# Patient Record
Sex: Male | Born: 1960 | Race: White | Hispanic: No | Marital: Married | Smoking: Former smoker
Health system: Southern US, Community
[De-identification: ages and names within clinical notes are randomized; demographics above are authoritative.]

## PROBLEM LIST (undated history)

## (undated) DIAGNOSIS — E119 Type 2 diabetes mellitus without complications: Secondary | ICD-10-CM

## (undated) DIAGNOSIS — I480 Paroxysmal atrial fibrillation: Secondary | ICD-10-CM

## (undated) DIAGNOSIS — I10 Essential (primary) hypertension: Secondary | ICD-10-CM

## (undated) DIAGNOSIS — I2510 Atherosclerotic heart disease of native coronary artery without angina pectoris: Secondary | ICD-10-CM

## (undated) DIAGNOSIS — E079 Disorder of thyroid, unspecified: Secondary | ICD-10-CM

## (undated) DIAGNOSIS — M1990 Unspecified osteoarthritis, unspecified site: Secondary | ICD-10-CM

## (undated) DIAGNOSIS — R55 Syncope and collapse: Secondary | ICD-10-CM

## (undated) DIAGNOSIS — E785 Hyperlipidemia, unspecified: Secondary | ICD-10-CM

## (undated) DIAGNOSIS — R011 Cardiac murmur, unspecified: Secondary | ICD-10-CM

## (undated) DIAGNOSIS — I219 Acute myocardial infarction, unspecified: Secondary | ICD-10-CM

## (undated) DIAGNOSIS — C801 Malignant (primary) neoplasm, unspecified: Secondary | ICD-10-CM

## (undated) DIAGNOSIS — T7840XA Allergy, unspecified, initial encounter: Secondary | ICD-10-CM

## (undated) DIAGNOSIS — D689 Coagulation defect, unspecified: Secondary | ICD-10-CM

## (undated) DIAGNOSIS — I509 Heart failure, unspecified: Secondary | ICD-10-CM

## (undated) HISTORY — PX: CHOLECYSTECTOMY: SHX55

## (undated) HISTORY — DX: Paroxysmal atrial fibrillation: I48.0

## (undated) HISTORY — PX: CARDIAC SURGERY: SHX584

## (undated) HISTORY — DX: Allergy, unspecified, initial encounter: T78.40XA

## (undated) HISTORY — DX: Acute myocardial infarction, unspecified: I21.9

## (undated) HISTORY — PX: EYE SURGERY: SHX253

## (undated) HISTORY — DX: Unspecified osteoarthritis, unspecified site: M19.90

## (undated) HISTORY — DX: Cardiac murmur, unspecified: R01.1

## (undated) HISTORY — DX: Heart failure, unspecified: I50.9

## (undated) HISTORY — DX: Coagulation defect, unspecified: D68.9

---

## 2017-06-27 DIAGNOSIS — R7401 Elevation of levels of liver transaminase levels: Secondary | ICD-10-CM | POA: Insufficient documentation

## 2017-06-27 DIAGNOSIS — I2510 Atherosclerotic heart disease of native coronary artery without angina pectoris: Secondary | ICD-10-CM | POA: Insufficient documentation

## 2017-06-27 DIAGNOSIS — E890 Postprocedural hypothyroidism: Secondary | ICD-10-CM | POA: Insufficient documentation

## 2017-10-21 DIAGNOSIS — R634 Abnormal weight loss: Secondary | ICD-10-CM | POA: Insufficient documentation

## 2017-10-21 DIAGNOSIS — R202 Paresthesia of skin: Secondary | ICD-10-CM | POA: Insufficient documentation

## 2018-09-10 ENCOUNTER — Observation Stay (HOSPITAL_COMMUNITY)
Admission: EM | Admit: 2018-09-10 | Discharge: 2018-09-12 | Disposition: A | Discharge status: Home / Self Care | Payer: Medicaid Other | Attending: Internal Medicine | Admitting: Internal Medicine

## 2018-09-10 ENCOUNTER — Other Ambulatory Visit: Payer: Self-pay

## 2018-09-10 ENCOUNTER — Encounter (HOSPITAL_COMMUNITY): Payer: Self-pay

## 2018-09-10 ENCOUNTER — Emergency Department (HOSPITAL_COMMUNITY): Payer: Medicaid Other

## 2018-09-10 DIAGNOSIS — E079 Disorder of thyroid, unspecified: Secondary | ICD-10-CM | POA: Insufficient documentation

## 2018-09-10 DIAGNOSIS — I200 Unstable angina: Secondary | ICD-10-CM | POA: Diagnosis present

## 2018-09-10 DIAGNOSIS — I2510 Atherosclerotic heart disease of native coronary artery without angina pectoris: Secondary | ICD-10-CM | POA: Diagnosis present

## 2018-09-10 DIAGNOSIS — Z79899 Other long term (current) drug therapy: Secondary | ICD-10-CM | POA: Insufficient documentation

## 2018-09-10 DIAGNOSIS — E119 Type 2 diabetes mellitus without complications: Secondary | ICD-10-CM

## 2018-09-10 DIAGNOSIS — Y831 Surgical operation with implant of artificial internal device as the cause of abnormal reaction of the patient, or of later complication, without mention of misadventure at the time of the procedure: Secondary | ICD-10-CM | POA: Insufficient documentation

## 2018-09-10 DIAGNOSIS — I25110 Atherosclerotic heart disease of native coronary artery with unstable angina pectoris: Secondary | ICD-10-CM | POA: Diagnosis not present

## 2018-09-10 DIAGNOSIS — I5030 Unspecified diastolic (congestive) heart failure: Secondary | ICD-10-CM | POA: Insufficient documentation

## 2018-09-10 DIAGNOSIS — T82858A Stenosis of vascular prosthetic devices, implants and grafts, initial encounter: Principal | ICD-10-CM | POA: Insufficient documentation

## 2018-09-10 DIAGNOSIS — E669 Obesity, unspecified: Secondary | ICD-10-CM | POA: Diagnosis not present

## 2018-09-10 DIAGNOSIS — Z7902 Long term (current) use of antithrombotics/antiplatelets: Secondary | ICD-10-CM | POA: Diagnosis not present

## 2018-09-10 DIAGNOSIS — Z6831 Body mass index (BMI) 31.0-31.9, adult: Secondary | ICD-10-CM | POA: Insufficient documentation

## 2018-09-10 DIAGNOSIS — I110 Hypertensive heart disease with heart failure: Secondary | ICD-10-CM | POA: Diagnosis not present

## 2018-09-10 DIAGNOSIS — E785 Hyperlipidemia, unspecified: Secondary | ICD-10-CM | POA: Diagnosis present

## 2018-09-10 DIAGNOSIS — Z79890 Hormone replacement therapy (postmenopausal): Secondary | ICD-10-CM | POA: Diagnosis not present

## 2018-09-10 DIAGNOSIS — R55 Syncope and collapse: Secondary | ICD-10-CM | POA: Diagnosis not present

## 2018-09-10 DIAGNOSIS — Z955 Presence of coronary angioplasty implant and graft: Secondary | ICD-10-CM | POA: Insufficient documentation

## 2018-09-10 DIAGNOSIS — I10 Essential (primary) hypertension: Secondary | ICD-10-CM | POA: Diagnosis not present

## 2018-09-10 DIAGNOSIS — E876 Hypokalemia: Secondary | ICD-10-CM | POA: Diagnosis not present

## 2018-09-10 DIAGNOSIS — Z87891 Personal history of nicotine dependence: Secondary | ICD-10-CM | POA: Insufficient documentation

## 2018-09-10 HISTORY — DX: Atherosclerotic heart disease of native coronary artery without angina pectoris: I25.10

## 2018-09-10 HISTORY — DX: Disorder of thyroid, unspecified: E07.9

## 2018-09-10 HISTORY — DX: Syncope and collapse: R55

## 2018-09-10 HISTORY — DX: Hyperlipidemia, unspecified: E78.5

## 2018-09-10 HISTORY — DX: Type 2 diabetes mellitus without complications: E11.9

## 2018-09-10 HISTORY — DX: Malignant (primary) neoplasm, unspecified: C80.1

## 2018-09-10 HISTORY — DX: Essential (primary) hypertension: I10

## 2018-09-10 LAB — APTT: aPTT: 27 seconds (ref 24–36)

## 2018-09-10 LAB — MAGNESIUM: Magnesium: 1.9 mg/dL (ref 1.7–2.4)

## 2018-09-10 LAB — CBC
HCT: 45.6 % (ref 39.0–52.0)
Hemoglobin: 15 g/dL (ref 13.0–17.0)
MCH: 29.4 pg (ref 26.0–34.0)
MCHC: 32.9 g/dL (ref 30.0–36.0)
MCV: 89.2 fL (ref 80.0–100.0)
Platelets: 261 10*3/uL (ref 150–400)
RBC: 5.11 MIL/uL (ref 4.22–5.81)
RDW: 12.1 % (ref 11.5–15.5)
WBC: 8 10*3/uL (ref 4.0–10.5)
nRBC: 0 % (ref 0.0–0.2)

## 2018-09-10 LAB — HEPARIN LEVEL (UNFRACTIONATED): Heparin Unfractionated: 0.33 IU/mL (ref 0.30–0.70)

## 2018-09-10 LAB — TSH: TSH: 0.958 u[IU]/mL (ref 0.350–4.500)

## 2018-09-10 LAB — BASIC METABOLIC PANEL
Anion gap: 9 (ref 5–15)
BUN: 11 mg/dL (ref 6–20)
CO2: 23 mmol/L (ref 22–32)
Calcium: 9 mg/dL (ref 8.9–10.3)
Chloride: 105 mmol/L (ref 98–111)
Creatinine, Ser: 0.91 mg/dL (ref 0.61–1.24)
GFR calc Af Amer: >60 mL/min (ref >60)
Glucose, Bld: 226 mg/dL — ABNORMAL HIGH (ref 70–99)
Potassium: 3 mmol/L — ABNORMAL LOW (ref 3.5–5.1)
Sodium: 137 mmol/L (ref 135–145)

## 2018-09-10 LAB — TROPONIN I
Troponin I: <0.03 ng/mL (ref <0.03)
Troponin I: <0.03 ng/mL (ref <0.03)

## 2018-09-10 LAB — HEMOGLOBIN A1C
Hgb A1c MFr Bld: 6.4 % — ABNORMAL HIGH (ref 4.8–5.6)
Mean Plasma Glucose: 136.98 mg/dL

## 2018-09-10 LAB — MRSA PCR SCREENING: MRSA by PCR: NEGATIVE

## 2018-09-10 LAB — PROTIME-INR
INR: 1.01
PROTHROMBIN TIME: 13.2 s (ref 11.4–15.2)

## 2018-09-10 LAB — I-STAT TROPONIN, ED: Troponin i, poc: 0 ng/mL (ref 0.00–0.08)

## 2018-09-10 MED ORDER — AMLODIPINE BESYLATE 10 MG PO TABS
10.0000 mg | ORAL_TABLET | Freq: Every day | ORAL | Status: DC
  Administered 2018-09-11 – 2018-09-12 (×2): 10 mg via ORAL
  Filled 2018-09-10 (×2): qty 1

## 2018-09-10 MED ORDER — CLOPIDOGREL BISULFATE 75 MG PO TABS
75.0000 mg | ORAL_TABLET | Freq: Every day | ORAL | Status: DC
  Administered 2018-09-11 – 2018-09-12 (×2): 75 mg via ORAL
  Filled 2018-09-10 (×2): qty 1

## 2018-09-10 MED ORDER — ASPIRIN EC 81 MG PO TBEC
81.0000 mg | DELAYED_RELEASE_TABLET | Freq: Every day | ORAL | Status: DC
  Administered 2018-09-11 – 2018-09-12 (×2): 81 mg via ORAL
  Filled 2018-09-10 (×2): qty 1

## 2018-09-10 MED ORDER — ASPIRIN 81 MG PO CHEW
324.0000 mg | CHEWABLE_TABLET | Freq: Once | ORAL | Status: DC
  Filled 2018-09-10: qty 4

## 2018-09-10 MED ORDER — LEVOTHYROXINE SODIUM 25 MCG PO TABS
125.0000 ug | ORAL_TABLET | Freq: Every day | ORAL | Status: DC
  Administered 2018-09-11 – 2018-09-12 (×2): 125 ug via ORAL
  Filled 2018-09-10 (×2): qty 1

## 2018-09-10 MED ORDER — DIAZEPAM 2 MG PO TABS: 2.0000 mg | ORAL_TABLET | Freq: Four times a day (QID) | ORAL | Status: DC | PRN

## 2018-09-10 MED ORDER — SODIUM CHLORIDE 0.9 % IV BOLUS
1000.0000 mL | Freq: Once | INTRAVENOUS | Status: AC
  Administered 2018-09-10: 1000 mL via INTRAVENOUS

## 2018-09-10 MED ORDER — NITROGLYCERIN 0.4 MG SL SUBL: 0.4000 mg | SUBLINGUAL_TABLET | SUBLINGUAL | Status: DC | PRN

## 2018-09-10 MED ORDER — MORPHINE SULFATE (PF) 4 MG/ML IV SOLN
4.0000 mg | Freq: Once | INTRAVENOUS | Status: AC
  Administered 2018-09-10: 4 mg via INTRAVENOUS
  Filled 2018-09-10: qty 1

## 2018-09-10 MED ORDER — HEPARIN BOLUS VIA INFUSION
4000.0000 [IU] | Freq: Once | INTRAVENOUS | Status: AC
  Administered 2018-09-10: 4000 [IU] via INTRAVENOUS

## 2018-09-10 MED ORDER — ATORVASTATIN CALCIUM 80 MG PO TABS
80.0000 mg | ORAL_TABLET | Freq: Every day | ORAL | Status: DC
  Administered 2018-09-11 – 2018-09-12 (×2): 80 mg via ORAL
  Filled 2018-09-10 (×2): qty 1

## 2018-09-10 MED ORDER — ACETAMINOPHEN 325 MG PO TABS: 650.0000 mg | ORAL_TABLET | ORAL | Status: DC | PRN

## 2018-09-10 MED ORDER — ONDANSETRON HCL 4 MG/2ML IJ SOLN: 4.0000 mg | Freq: Four times a day (QID) | INTRAMUSCULAR | Status: DC | PRN

## 2018-09-10 MED ORDER — POTASSIUM CHLORIDE CRYS ER 20 MEQ PO TBCR
40.0000 meq | EXTENDED_RELEASE_TABLET | Freq: Once | ORAL | Status: AC
  Administered 2018-09-10: 40 meq via ORAL
  Filled 2018-09-10: qty 2

## 2018-09-10 MED ORDER — ASPIRIN 300 MG RE SUPP
300.0000 mg | RECTAL | Status: AC
  Filled 2018-09-10: qty 1

## 2018-09-10 MED ORDER — HYDROCHLOROTHIAZIDE 12.5 MG PO CAPS
12.5000 mg | ORAL_CAPSULE | Freq: Every day | ORAL | Status: DC
  Administered 2018-09-11: 12.5 mg via ORAL
  Filled 2018-09-10: qty 1

## 2018-09-10 MED ORDER — HEPARIN (PORCINE) 25000 UT/250ML-% IV SOLN
1100.0000 [IU]/h | INTRAVENOUS | Status: DC
  Administered 2018-09-10 – 2018-09-11 (×3): 1100 [IU]/h via INTRAVENOUS
  Filled 2018-09-10 (×2): qty 250

## 2018-09-10 MED ORDER — POTASSIUM CHLORIDE 10 MEQ/100ML IV SOLN
10.0000 meq | INTRAVENOUS | Status: AC
  Administered 2018-09-10 (×2): 10 meq via INTRAVENOUS
  Filled 2018-09-10 (×2): qty 100

## 2018-09-10 MED ORDER — CARVEDILOL 12.5 MG PO TABS
12.5000 mg | ORAL_TABLET | Freq: Two times a day (BID) | ORAL | Status: DC
  Administered 2018-09-11 – 2018-09-12 (×4): 12.5 mg via ORAL
  Filled 2018-09-10 (×5): qty 1

## 2018-09-10 MED ORDER — ASPIRIN 81 MG PO CHEW: 324.0000 mg | CHEWABLE_TABLET | ORAL | Status: AC

## 2018-09-10 NOTE — H&P (Signed)
History and Physical   Patient ID: William Mcmahon, MRN: [id number], DOB: March 18, 1961   Date of Encounter: 09/10/2018, 6:54 PM  Primary Care Provider: System, Pcp Not In Cardiologist: NA Electrophysiologist:  NA  Chief Complaint:  CP  History of Present Illness: William Mcmahon is a 58 y.o. male w/ h/o CAD and multiple PCI, w/ most recent cardiac event being about a year ago (s/p PCI to RCA in Feb 2019 at [facility]), recently relocated to the area and not established w/ local cardiologist. Pt presented to OSH ED c/o CP/jaw pain that started this morning around 11am while he was at church. He describes an "achy" pain in his neck and jaw that started suddenly, 10/10, associated with aching in his teeth, as well as dizziness/lightheadedness and a feeling like he might pass out. It was also associated with diaphoresis and SOB. This he says is essentially his anginal equivalent. The pain lasted 10-20 min or so and improved w/ NTG. He has not had any recent exertional anginal sx until today.   Past Medical History:  Diagnosis Date  . Cancer ([place])    thyroid  . Coronary artery disease    hx of stents  . Hyperlipidemia   . Hypertension   . Thyroid disease     Past Surgical History:  Procedure Laterality Date  . CARDIAC SURGERY     stents  . CHOLECYSTECTOMY       Prior to Admission medications   Medication Sig Start Date End Date Taking? Authorizing Provider  amLODipine (NORVASC) 10 MG tablet Take 10 mg by mouth daily.   Yes [provider]  atorvastatin (LIPITOR) 80 MG tablet Take 80 mg by mouth daily.   Yes [provider]  carvedilol (COREG) 12.5 MG tablet Take 12.5 mg by mouth 2 (two) times daily with a meal.   Yes [provider]  clopidogrel (PLAVIX) 75 MG tablet Take 75 mg by mouth daily.   Yes [provider]  diazepam (VALIUM) 2 MG tablet Take 2 mg by mouth every 6 (six) hours as needed for anxiety.   Yes [provider]    hydrochlorothiazide (MICROZIDE) 12.5 MG capsule Take 12.5 mg by mouth daily.   Yes [provider]  levothyroxine (SYNTHROID, LEVOTHROID) 125 MCG tablet Take 125 mcg by mouth daily before breakfast.   Yes [provider]  nitroGLYCERIN (NITROSTAT) 0.4 MG SL tablet Place 0.4 mg under the tongue every 5 (five) minutes as needed for chest pain. 06/29/17  Yes [provider]  traMADol (ULTRAM) 50 MG tablet Take by mouth every 6 (six) hours as needed.   Yes [provider]  amoxicillin-clavulanate (AUGMENTIN) 875-125 MG tablet Take 1 tablet by mouth 2 (two) times daily.    [provider]  oseltamivir (TAMIFLU) 75 MG capsule Take 75 mg by mouth.    [provider]     Allergies: No Known Allergies  Social History:  The patient  reports that he has quit smoking. He has never used smokeless tobacco. He reports that he does not drink alcohol or use drugs.   Family History:  The patient's family history is not on file.   ROS:  Please see the history of present illness.     All other systems reviewed and negative.   Vital Signs: Blood pressure 125/71, pulse (!) 54, temperature 98.7 F (37.1 C), temperature source Oral, resp. rate 14, height 5\' 9"  (1.753 m), weight 96.3 kg, SpO2 100 %.  PHYSICAL EXAM: General:  Well  nourished, well developed, in no acute distress HEENT: normal Lymph: no adenopathy Neck: no JVD Endocrine:  No thryomegaly Vascular: No carotid bruits; DP pulses 2+ bilaterally  Cardiac:  normal S1, S2; RRR; no murmur  Lungs:  clear to auscultation bilaterally, no wheezing, rhonchi or rales  Abd: soft, nontender, no hepatomegaly  Ext: no edema Musculoskeletal:  No deformities, BUE and BLE strength normal and equal Skin: warm and dry  Neuro:  CNs 2-12 intact, no focal abnormalities noted Psych:  Normal affect   EKG:  SB, HR 48, LPFB, nonspecific ST changes  Labs:   Lab Results  Component Value Date   WBC 8.0 09/10/2018    HGB 15.0 09/10/2018   HCT 45.6 09/10/2018   MCV 89.2 09/10/2018   PLT 261 09/10/2018   Recent Labs  Lab 09/10/18 1250  NA 137  K 3.0*  CL 105  CO2 23  BUN 11  CREATININE 0.91  CALCIUM 9.0  GLUCOSE 226*   Recent Labs    09/10/18 1418  TROPONINI <0.03   Troponin (Point of Care Test) Recent Labs    09/10/18 1248  TROPIPOC 0.00    No results found for: CHOL, HDL, LDLCALC, TRIG No results found for: DDIMER  Radiology/Studies:  Dg Chest 2 View  Result Date: 09/10/2018 CLINICAL DATA:  Chest pain radiating to jaw. Coronary artery disease EXAM: CHEST - 2 VIEW COMPARISON:  None. FINDINGS: The heart size and mediastinal contours are within normal limits. Low lung volumes are noted. Both lungs are clear. The visualized skeletal structures are unremarkable. IMPRESSION: No active cardiopulmonary disease. Electronically Signed   By: Earle Gell M.D.   On: 09/10/2018 13:49   LHC 10-24-17 in [place] via [facility] Severe mid right coronary artery disease.  Moderate disease within the obtuse marginal and posterior descending  artery.  Patent stents within the LAD, ramus intermedius, and right coronary  artery.  RECOMMENDATIONS:  This gentleman's chest pain syndrome appears to be related to the severe  mid right coronary disease. A subsequent pressure wire showed the LAD  disease to be not significant. Dr. Moshe Cipro proceeded with drug-eluting  stent placement to the right coronary artery with good results. Continued  aggressive medical therapy is planned.  TTE 10-23-17 at [place] left ventricular wall motion and contractility are within normal Limits. EF 50-55%. There is a trace of mitral regurgitation. There is mild tricuspid regurgitation. There is no pericardial effusion.    ASSESSMENT AND PLAN:   1. CAD: possible [place] w/ new/worsening CP sx. Will cont heparin IV gtt. Pt currently pain-free. Enzymes thus far has been negative, no acute ST changes. Will  plan for elective LHC in the AM, routine TTE  2. HTN/dyslipidemia: will restart home regimen. Pt on high-intensity statin therapy; if FLP shows LDL currently >70 he could be considered for PCSK9i therapy as outpatient.   Signed,  Rudean Curt, MD  09/10/2018 6:54 PM

## 2018-09-10 NOTE — ED Provider Notes (Signed)
[facility] EMERGENCY DEPARTMENT Provider Note   CSN: [id number] Arrival date & time: 09/10/18  1232     History   Chief Complaint Chief Complaint  Patient presents with  . Chest Pain  . Jaw Pain    HPI William Mcmahon is a 58 y.o. male.  He has a significant history of coronary artery disease with multiple stents.  His last cardiac event was about a year ago.  He said he was in church today at around 11:10 AM when he started getting some neck and jaw pain.  Says his teeth were aching and he rated the pain as 10 out of 10.  He felt like he might pass out.  Ultimately he was able to get out of the car with assistance with his family and he was given 2 nitroglycerin by his wife.  It was associated with diaphoresis and feeling short of breath.  He is here complaining of 2 out of 10 jaw pain.  There was never any chest pain.  He said about a week ago he had a syncopal event at the dinner table.  He fell and hit his head.  Sounds like most of his cardiology work-up was in the area [place].  He is recently moved to this area.  The history is provided by the patient.  Chest Pain  Pain quality: aching   Pain radiates to:  Neck, L jaw and R jaw Pain severity:  Severe Onset quality:  Sudden Duration:  2 hours Timing:  Constant Progression:  Partially resolved Chronicity:  Recurrent Context: at rest   Context comment:  At church Relieved by:  Nitroglycerin Worsened by:  Nothing Ineffective treatments:  None tried Associated symptoms: diaphoresis, fatigue, nausea, near-syncope and shortness of breath   Associated symptoms: no abdominal pain, no altered mental status, no back pain, no cough, no fever, no headache, no heartburn, no lower extremity edema, no numbness and no palpitations   Risk factors: coronary artery disease, high cholesterol and hypertension     Past Medical History:  Diagnosis Date  . Cancer ([place])    thyroid  . Coronary artery disease    hx of stents  .  Hyperlipidemia   . Hypertension   . Thyroid disease     There are no active problems to display for this patient.   Past Surgical History:  Procedure Laterality Date  . CARDIAC SURGERY     stents  . CHOLECYSTECTOMY          Home Medications    Prior to Admission medications   Not on File    Family History No family history on file.  Social History Social History   Tobacco Use  . Smoking status: Former Smoker  Substance Use Topics  . Alcohol use: Never    Frequency: Never  . Drug use: Never     Allergies   Patient has no known allergies.   Review of Systems Review of Systems  Constitutional: Positive for diaphoresis and fatigue. Negative for fever.  HENT: Negative for sore throat.   Eyes: Negative for visual disturbance.  Respiratory: Positive for shortness of breath. Negative for cough.   Cardiovascular: Positive for chest pain and near-syncope. Negative for palpitations.  Gastrointestinal: Positive for nausea. Negative for abdominal pain and heartburn.  Genitourinary: Negative for dysuria.  Musculoskeletal: Positive for neck pain. Negative for back pain.  Skin: Positive for wound (forehead abrasion). Negative for rash.  Neurological: Negative for numbness and headaches.     Physical Exam  Updated Vital Signs BP 111/69 (BP Location: Left Arm)   Pulse (!) 53   Temp 97.6 F (36.4 C) (Oral)   Resp 15   Ht 5\' 9"  (1.753 m)   Wt 102.1 kg   SpO2 98%   BMI 33.23 kg/m   Physical Exam Vitals signs and nursing note reviewed.  Constitutional:      Appearance: He is well-developed.  HENT:     Head: Normocephalic.     Comments: He has abrasions on his forehead. Eyes:     Conjunctiva/sclera: Conjunctivae normal.  Neck:     Musculoskeletal: Neck supple.  Cardiovascular:     Rate and Rhythm: Normal rate and regular rhythm.     Heart sounds: Normal heart sounds. No murmur.  Pulmonary:     Effort: Pulmonary effort is normal. No respiratory distress.       Breath sounds: Normal breath sounds.  Abdominal:     Palpations: Abdomen is soft.     Tenderness: There is no abdominal tenderness.  Musculoskeletal: Normal range of motion.        General: No tenderness or deformity.     Right lower leg: He exhibits no tenderness. No edema.     Left lower leg: He exhibits no tenderness. No edema.  Skin:    General: Skin is warm and dry.     Capillary Refill: Capillary refill takes less than 2 seconds.  Neurological:     General: No focal deficit present.     Mental Status: He is alert and oriented to person, place, and time.      ED Treatments / Results  Labs (all labs ordered are listed, but only abnormal results are displayed) Labs Reviewed  BASIC METABOLIC PANEL - Abnormal; Notable for the following components:      Result Value   Potassium 3.0 (*)    Glucose, Bld 226 (*)    All other components within normal limits  CBC  MAGNESIUM  TROPONIN I  PROTIME-INR  APTT  HEPARIN LEVEL (UNFRACTIONATED)  I-STAT TROPONIN, ED  I-STAT TROPONIN, ED    EKG EKG Interpretation  Date/Time:  Sunday September 10 2018 12:43:03 EST Ventricular Rate:  55 PR Interval:    QRS Duration: 107 QT Interval:  410 QTC Calculation: 393 R Axis:   94 Text Interpretation:  Sinus rhythm Left posterior fascicular block Abnormal R-wave progression, late transition Nonspecific T abnormalities, anterior leads Baseline wander in lead(s) II III aVF no prior to compare with Confirmed by Aletta Edouard [phone]) on 09/10/2018 12:46:38 PM   Radiology Dg Chest 2 View  Result Date: 09/10/2018 CLINICAL DATA:  Chest pain radiating to jaw. Coronary artery disease EXAM: CHEST - 2 VIEW COMPARISON:  None. FINDINGS: The heart size and mediastinal contours are within normal limits. Low lung volumes are noted. Both lungs are clear. The visualized skeletal structures are unremarkable. IMPRESSION: No active cardiopulmonary disease. Electronically Signed   By: Earle Gell M.D.   On:  09/10/2018 13:49    Procedures .Critical Care Performed by: Hayden Rasmussen, MD Authorized by: Hayden Rasmussen, MD   Critical care provider statement:    Critical care time (minutes):  45   Critical care time was exclusive of:  Separately billable procedures and treating other patients   Critical care was necessary to treat or prevent imminent or life-threatening deterioration of the following conditions:  Cardiac failure   Critical care was time spent personally by me on the following activities:  Discussions with consultants, evaluation of  patient's response to treatment, examination of patient, ordering and performing treatments and interventions, ordering and review of laboratory studies, ordering and review of radiographic studies, pulse oximetry, re-evaluation of patient's condition, obtaining history from patient or surrogate, review of old charts and development of treatment plan with patient or surrogate   I assumed direction of critical care for this patient from another provider in my specialty: no     (including critical care time)  Medications Ordered in ED Medications  aspirin chewable tablet 324 mg (324 mg Oral Not Given 09/10/18 1320)  sodium chloride 0.9 % bolus 1,000 mL (1,000 mLs Intravenous New Bag/Given 09/10/18 1444)  potassium chloride 10 mEq in 100 mL IVPB (10 mEq Intravenous New Bag/Given 09/10/18 1411)  heparin bolus via infusion 4,000 Units (has no administration in time range)    Followed by  heparin ADULT infusion 100 units/mL (25000 units/227mL sodium chloride 0.45%) (has no administration in time range)  morphine 4 MG/ML injection 4 mg (4 mg Intravenous Given 09/10/18 1320)  sodium chloride 0.9 % bolus 1,000 mL (1,000 mLs Intravenous New Bag/Given 09/10/18 1329)  potassium chloride SA (K-DUR,KLOR-CON) CR tablet 40 mEq (40 mEq Oral Given 09/10/18 1408)  morphine 4 MG/ML injection 4 mg (4 mg Intravenous Given 09/10/18 1405)     Initial Impression / Assessment  and Plan / ED Course  I have reviewed the triage vital signs and the nursing notes.  Pertinent labs & imaging results that were available during my care of the patient were reviewed by me and considered in my medical decision making (see chart for details).  Clinical Course as of Sep 11 1451  Sun Sep 10, 2018  1353 Patient had been pain-free while he was here.  I just got called that his jaw pain recurred and was severe radiating to his back.  Repeat EKG does not show any ST elevations.  He says the pain is letting up now.  I put him in for a CT chest and have ordered him some more pain medicine.  We will repeat a troponin now.   [MB]  1638 Discussed with Dr. Haroldine Laws at [facility] cardiology.  He is accepting the patient to stepdown.  [facility] involved.  He recommends starting the patient on heparin drip.  He did not feel the patient needed a CT.    [MB]    Clinical Course User Index [MB] Hayden Rasmussen, MD     Final Clinical Impressions(s) / ED Diagnoses   Final diagnoses:  Unstable angina [facility])  Hypokalemia    ED Discharge Orders    None       Hayden Rasmussen, MD 09/10/18 1454

## 2018-09-10 NOTE — ED Triage Notes (Addendum)
Family reports that pt was at church and approx 1110 started complaining of neck and jaw pain. Reports that is how he presents with CP Wife states she gave 2 nitroglycerin. Pt reports that is feels like it is throbbing in his neck. Complaining of difficulty breathing. Pt had syncopal episode last week

## 2018-09-10 NOTE — Progress Notes (Signed)
ANTICOAGULATION CONSULT NOTE - Initial Consult  Pharmacy Consult for Heparin Indication: chest pain/ACS  No Known Allergies  Patient Measurements: Height: 5\' 9"  (175.3 cm) Weight: 225 lb (102.1 kg) IBW/kg (Calculated) : 70.7 HEPARIN DW (KG): 92.5  Vital Signs: Temp: 97.6 F (36.4 C) (01/12 1248) Temp Source: Oral (01/12 1248) BP: 102/65 (01/12 1330) Pulse Rate: 50 (01/12 1330)  Labs: Recent Labs    09/10/18 1250  HGB 15.0  HCT 45.6  PLT 261  CREATININE 0.91    Estimated Creatinine Clearance: 106.8 mL/min (by C-G formula based on SCr of 0.91 mg/dL).   Medical History: Past Medical History:  Diagnosis Date  . Cancer ([place])    thyroid  . Coronary artery disease    hx of stents  . Hyperlipidemia   . Hypertension   . Thyroid disease     Medications:  See med rec  Assessment: Patient presented to ED with difficulty in breathing and CP earlier this morning at church. Patient also noted he had neck and jaw pain. Pharmacy asked to start heparin  Goal of Therapy:  Heparin level 0.3-0.7 units/ml Monitor platelets by anticoagulation protocol: Yes   Plan:  Give 4000 units bolus x 1 Start heparin infusion at 1100 units/hr Check anti-Xa level in 6-8 hours and daily while on heparin Continue to monitor H&H and platelets   Isac Sarna, BS Vena [place], BCPS Clinical Pharmacist Pager [phone] Cristy Friedlander 09/10/2018,2:44 PM

## 2018-09-10 NOTE — Progress Notes (Signed)
ANTICOAGULATION CONSULT NOTE - Follow-up Consult  Pharmacy Consult for Heparin Indication: chest pain/ACS  No Known Allergies  Patient Measurements: Height: 5\' 9"  (175.3 cm) Weight: 212 lb 4.9 oz (96.3 kg) IBW/kg (Calculated) : 70.7 HEPARIN DW (KG): 90.8  Vital Signs: Temp: 98.2 F (36.8 C) (01/12 1929) Temp Source: Oral (01/12 1929) BP: 122/83 (01/12 1929) Pulse Rate: 58 (01/12 1929)  Labs: Recent Labs    09/10/18 1245 09/10/18 1250 09/10/18 1418 09/10/18 1953 09/10/18 2141  HGB  --  15.0  --   --   --   HCT  --  45.6  --   --   --   PLT  --  261  --   --   --   APTT 27  --   --   --   --   LABPROT 13.2  --   --   --   --   INR 1.01  --   --   --   --   HEPARINUNFRC  --   --   --   --  0.33  CREATININE  --  0.91  --   --   --   TROPONINI  --   --  <0.03 <0.03  --     Estimated Creatinine Clearance: 103.7 mL/min (by C-G formula based on SCr of 0.91 mg/dL).   Assessment: Patient presented to ED with difficulty in breathing and CP earlier this morning at church. Patient also noted he had neck and jaw pain. Pharmacy asked to start heparin.  Heparin level therapeutic (0.33) on gtt at 1100 units/hr. Noted plan for cath in the morning.  Goal of Therapy:  Heparin level 0.3-0.7 units/ml Monitor platelets by anticoagulation protocol: Yes   Plan:  Continue heparin at 1100 units/hr Will f/u daily heparin level  Sherlon Handing, PharmD, BCPS Clinical pharmacist  **Pharmacist phone directory can now be found on [email or web address] ([email or web address]).  Listed under [place]. 09/10/2018,10:24 PM

## 2018-09-11 ENCOUNTER — Observation Stay (HOSPITAL_BASED_OUTPATIENT_CLINIC_OR_DEPARTMENT_OTHER): Payer: Medicaid Other

## 2018-09-11 ENCOUNTER — Encounter (HOSPITAL_COMMUNITY)
Admission: EM | Disposition: A | Discharge status: Home / Self Care | Payer: Self-pay | Source: Home / Self Care | Attending: Emergency Medicine

## 2018-09-11 ENCOUNTER — Encounter (HOSPITAL_COMMUNITY): Payer: Self-pay | Admitting: *Deleted

## 2018-09-11 DIAGNOSIS — R7303 Prediabetes: Secondary | ICD-10-CM | POA: Diagnosis not present

## 2018-09-11 DIAGNOSIS — E785 Hyperlipidemia, unspecified: Secondary | ICD-10-CM | POA: Diagnosis not present

## 2018-09-11 DIAGNOSIS — T82858A Stenosis of vascular prosthetic devices, implants and grafts, initial encounter: Secondary | ICD-10-CM | POA: Diagnosis not present

## 2018-09-11 DIAGNOSIS — E876 Hypokalemia: Secondary | ICD-10-CM | POA: Diagnosis not present

## 2018-09-11 DIAGNOSIS — Z955 Presence of coronary angioplasty implant and graft: Secondary | ICD-10-CM | POA: Diagnosis not present

## 2018-09-11 DIAGNOSIS — I200 Unstable angina: Secondary | ICD-10-CM

## 2018-09-11 DIAGNOSIS — I25110 Atherosclerotic heart disease of native coronary artery with unstable angina pectoris: Secondary | ICD-10-CM | POA: Diagnosis not present

## 2018-09-11 HISTORY — PX: LEFT HEART CATH AND CORONARY ANGIOGRAPHY: CATH118249

## 2018-09-11 LAB — LIPID PANEL
CHOLESTEROL: 113 mg/dL (ref 0–200)
HDL: 25 mg/dL — ABNORMAL LOW (ref >40)
LDL Cholesterol: 73 mg/dL (ref 0–99)
Total CHOL/HDL Ratio: 4.5 RATIO
Triglycerides: 73 mg/dL (ref <150)
VLDL: 15 mg/dL (ref 0–40)

## 2018-09-11 LAB — BASIC METABOLIC PANEL
Anion gap: 8 (ref 5–15)
BUN: 6 mg/dL (ref 6–20)
CO2: 24 mmol/L (ref 22–32)
Calcium: 8.6 mg/dL — ABNORMAL LOW (ref 8.9–10.3)
Chloride: 109 mmol/L (ref 98–111)
Creatinine, Ser: 0.8 mg/dL (ref 0.61–1.24)
GFR calc Af Amer: >60 mL/min (ref >60)
GFR calc non Af Amer: >60 mL/min (ref >60)
Glucose, Bld: 110 mg/dL — ABNORMAL HIGH (ref 70–99)
Potassium: 3 mmol/L — ABNORMAL LOW (ref 3.5–5.1)
SODIUM: 141 mmol/L (ref 135–145)

## 2018-09-11 LAB — ECHOCARDIOGRAM COMPLETE
HEIGHTINCHES: 69 in
Weight: 3396.85 oz

## 2018-09-11 LAB — PROTIME-INR
INR: 1.06
Prothrombin Time: 13.7 seconds (ref 11.4–15.2)

## 2018-09-11 LAB — TROPONIN I: Troponin I: <0.03 ng/mL (ref <0.03)

## 2018-09-11 LAB — CBC
HCT: 42.8 % (ref 39.0–52.0)
Hemoglobin: 14.4 g/dL (ref 13.0–17.0)
MCH: 29.9 pg (ref 26.0–34.0)
MCHC: 33.6 g/dL (ref 30.0–36.0)
MCV: 89 fL (ref 80.0–100.0)
Platelets: 215 10*3/uL (ref 150–400)
RBC: 4.81 MIL/uL (ref 4.22–5.81)
RDW: 11.9 % (ref 11.5–15.5)
WBC: 7.6 10*3/uL (ref 4.0–10.5)
nRBC: 0 % (ref 0.0–0.2)

## 2018-09-11 LAB — HIV ANTIBODY (ROUTINE TESTING W REFLEX): HIV Screen 4th Generation wRfx: NONREACTIVE

## 2018-09-11 LAB — HEPARIN LEVEL (UNFRACTIONATED): Heparin Unfractionated: 0.39 IU/mL (ref 0.30–0.70)

## 2018-09-11 SURGERY — LEFT HEART CATH AND CORONARY ANGIOGRAPHY
Anesthesia: LOCAL

## 2018-09-11 MED ORDER — SODIUM CHLORIDE 0.9 % IV SOLN: INTRAVENOUS | Status: AC

## 2018-09-11 MED ORDER — SODIUM CHLORIDE 0.9% FLUSH: 3.0000 mL | Freq: Two times a day (BID) | INTRAVENOUS | Status: DC

## 2018-09-11 MED ORDER — HEPARIN (PORCINE) IN NACL 1000-0.9 UT/500ML-% IV SOLN
INTRAVENOUS | Status: AC
  Filled 2018-09-11: qty 2000

## 2018-09-11 MED ORDER — VERAPAMIL HCL 2.5 MG/ML IV SOLN
INTRAVENOUS | Status: DC | PRN
  Administered 2018-09-11: 10 mL via INTRA_ARTERIAL

## 2018-09-11 MED ORDER — MIDAZOLAM HCL 2 MG/2ML IJ SOLN
INTRAMUSCULAR | Status: AC
  Filled 2018-09-11: qty 2

## 2018-09-11 MED ORDER — SODIUM CHLORIDE 0.9 % WEIGHT BASED INFUSION
3.0000 mL/kg/h | INTRAVENOUS | Status: DC
  Administered 2018-09-11: 3 mL/kg/h via INTRAVENOUS

## 2018-09-11 MED ORDER — ASPIRIN 81 MG PO CHEW: 81.0000 mg | CHEWABLE_TABLET | ORAL | Status: DC

## 2018-09-11 MED ORDER — SODIUM CHLORIDE 0.9% FLUSH: 3.0000 mL | INTRAVENOUS | Status: DC | PRN

## 2018-09-11 MED ORDER — LIDOCAINE HCL (PF) 1 % IJ SOLN
INTRAMUSCULAR | Status: DC | PRN
  Administered 2018-09-11: 2 mL

## 2018-09-11 MED ORDER — IOHEXOL 350 MG/ML SOLN
INTRAVENOUS | Status: DC | PRN
  Administered 2018-09-11: 90 mL via INTRA_ARTERIAL

## 2018-09-11 MED ORDER — FENTANYL CITRATE (PF) 100 MCG/2ML IJ SOLN
INTRAMUSCULAR | Status: AC
  Filled 2018-09-11: qty 2

## 2018-09-11 MED ORDER — LIDOCAINE HCL (PF) 1 % IJ SOLN
INTRAMUSCULAR | Status: AC
  Filled 2018-09-11: qty 60

## 2018-09-11 MED ORDER — HEPARIN SODIUM (PORCINE) 1000 UNIT/ML IJ SOLN
INTRAMUSCULAR | Status: DC | PRN
  Administered 2018-09-11: 5000 [IU] via INTRAVENOUS

## 2018-09-11 MED ORDER — SODIUM CHLORIDE 0.9% FLUSH
3.0000 mL | Freq: Two times a day (BID) | INTRAVENOUS | Status: DC
  Administered 2018-09-11 – 2018-09-12 (×2): 3 mL via INTRAVENOUS

## 2018-09-11 MED ORDER — VERAPAMIL HCL 2.5 MG/ML IV SOLN
INTRAVENOUS | Status: AC
  Filled 2018-09-11: qty 2

## 2018-09-11 MED ORDER — MIDAZOLAM HCL 2 MG/2ML IJ SOLN
INTRAMUSCULAR | Status: DC | PRN
  Administered 2018-09-11: 2 mg via INTRAVENOUS
  Administered 2018-09-11: 1 mg via INTRAVENOUS

## 2018-09-11 MED ORDER — HEPARIN (PORCINE) IN NACL 1000-0.9 UT/500ML-% IV SOLN
INTRAVENOUS | Status: DC | PRN
  Administered 2018-09-11: 500 mL

## 2018-09-11 MED ORDER — SODIUM CHLORIDE 0.9 % IV SOLN: 250.0000 mL | INTRAVENOUS | Status: DC | PRN

## 2018-09-11 MED ORDER — SODIUM CHLORIDE 0.9 % WEIGHT BASED INFUSION: 1.0000 mL/kg/h | INTRAVENOUS | Status: DC

## 2018-09-11 MED ORDER — FENTANYL CITRATE (PF) 100 MCG/2ML IJ SOLN
INTRAMUSCULAR | Status: DC | PRN
  Administered 2018-09-11: 25 ug via INTRAVENOUS
  Administered 2018-09-11: 50 ug via INTRAVENOUS

## 2018-09-11 MED ORDER — HEPARIN SODIUM (PORCINE) 1000 UNIT/ML IJ SOLN
INTRAMUSCULAR | Status: AC
  Filled 2018-09-11: qty 1

## 2018-09-11 SURGICAL SUPPLY — 10 items
CATH INFINITI 5FR MULTPACK ANG (CATHETERS) ×2 IMPLANT
DEVICE RAD COMP TR BAND LRG (VASCULAR PRODUCTS) ×2 IMPLANT
GLIDESHEATH SLEND SS 6F .021 (SHEATH) ×2 IMPLANT
GUIDEWIRE INQWIRE 1.5J.035X260 (WIRE) ×1 IMPLANT
INQWIRE 1.5J .035X260CM (WIRE) ×2
KIT HEART LEFT (KITS) ×2 IMPLANT
PACK CARDIAC CATHETERIZATION (CUSTOM PROCEDURE TRAY) ×2 IMPLANT
SYR MEDRAD MARK 7 150ML (SYRINGE) ×2 IMPLANT
TRANSDUCER W/STOPCOCK (MISCELLANEOUS) ×2 IMPLANT
TUBING CIL FLEX 10 FLL-RA (TUBING) ×2 IMPLANT

## 2018-09-11 NOTE — Interval H&P Note (Signed)
History and Physical Interval Note:  09/11/2018 3:38 PM  William Mcmahon  has presented today for surgery, with the diagnosis of chest pain-unstable angina/syncope the various methods of treatment have been discussed with the patient and family. After consideration of risks, benefits and other options for treatment, the patient has consented to  Procedure(s): LEFT HEART CATH AND CORONARY ANGIOGRAPHY (N/A) with possible PERCUTANEOUS CORONARY INTERVENTION as a surgical intervention .  The patient's history has been reviewed, patient examined, no change in status, stable for surgery.  I have reviewed the patient's chart and labs.  Questions were answered to the patient's satisfaction.    Cath Lab Visit (complete for each Cath Lab visit)  Clinical Evaluation Leading to the Procedure:   ACS: Yes.    Non-ACS:    Anginal Classification: CCS III  Anti-ischemic medical therapy: Maximal Therapy (2 or more classes of medications)  Non-Invasive Test Results: No non-invasive testing performed  Prior CABG: No previous CABG    Glenetta Hew

## 2018-09-11 NOTE — Plan of Care (Signed)

## 2018-09-11 NOTE — H&P (View-Only) (Signed)
Progress Note  Patient Name: William Mcmahon Date of Encounter: 09/11/2018  Primary Cardiologist: No primary care provider on file. New (lives in [place])  Subjective   No further angina since admission.Marland Kitchen He reports a syncopal event last Tuesday (January 7), preceded by a sensation of heat, but without palpitations or angina or diaphoresis.  Does not have a previous history of angina.  He has a small scalp laceration from that event. His anginal episode that led to admission a couple of days ago was resolved with 2 sublingual nitroglycerin, again followed by brief syncope during transportation by EMS.  Inpatient Medications    Scheduled Meds: . amLODipine  10 mg Oral Daily  . aspirin  324 mg Oral NOW   Or  . aspirin  300 mg Rectal NOW  . aspirin EC  81 mg Oral Daily  . atorvastatin  80 mg Oral Daily  . carvedilol  12.5 mg Oral BID WC  . clopidogrel  75 mg Oral Daily  . hydrochlorothiazide  12.5 mg Oral Daily  . levothyroxine  125 mcg Oral QAC breakfast   Continuous Infusions: . heparin 1,100 Units/hr (09/10/18 2035)   PRN Meds: acetaminophen, diazepam, nitroGLYCERIN, ondansetron (ZOFRAN) IV   Vital Signs    Vitals:   09/10/18 1929 09/10/18 2336 09/11/18 0419 09/11/18 0745  BP: 122/83 129/78 (!) 143/78   Pulse: (!) 58 (!) 51    Resp: 17 14 (!) 23   Temp: 98.2 F (36.8 C) 98.3 F (36.8 C) 97.9 F (36.6 C) 98.8 F (37.1 C)  TempSrc: Oral Oral Oral Oral  SpO2: 97% 95% 100%   Weight:      Height:        Intake/Output Summary (Last 24 hours) at 09/11/2018 0811 Last data filed at 09/11/2018 0700 Gross per 24 hour  Intake 2331 ml  Output -  Net 2331 ml   Last 3 Weights 09/10/2018 09/10/2018  Weight (lbs) 212 lb 4.9 oz 225 lb  Weight (kg) 96.3 kg 102.059 kg      Telemetry    NSR - Personally Reviewed  ECG    Sinus rhythm, anterior T wave inversion- Personally Reviewed  Physical Exam  Appears comfortable lying fully flat in bed GEN: No acute distress.     Neck: No JVD Cardiac: RRR, no murmurs, rubs, or gallops.  Respiratory: Clear to auscultation bilaterally. GI: Soft, nontender, non-distended  MS: No edema; No deformity. Neuro:  Nonfocal  Psych: Normal affect   Labs    Chemistry Recent Labs  Lab 09/10/18 1250 09/11/18 0651  NA 137 141  K 3.0* 3.0*  CL 105 109  CO2 23 24  GLUCOSE 226* 110*  BUN 11 6  CREATININE 0.91 0.80  CALCIUM 9.0 8.6*  GFRNONAA >60 >60  GFRAA >60 >60  ANIONGAP 9 8     Hematology Recent Labs  Lab 09/10/18 1250 09/11/18 0116  WBC 8.0 7.6  RBC 5.11 4.81  HGB 15.0 14.4  HCT 45.6 42.8  MCV 89.2 89.0  MCH 29.4 29.9  MCHC 32.9 33.6  RDW 12.1 11.9  PLT 261 215    Cardiac Enzymes Recent Labs  Lab 09/10/18 1418 09/10/18 1953 09/11/18 0116 09/11/18 0651  TROPONINI <0.03 <0.03 <0.03 <0.03    Recent Labs  Lab 09/10/18 1248  TROPIPOC 0.00     Lipid Panel     Component Value Date/Time   CHOL 113 09/11/2018 0116   TRIG 73 09/11/2018 0116   HDL 25 (L) 09/11/2018 0116   CHOLHDL  4.5 09/11/2018 0116   VLDL 15 09/11/2018 0116   LDLCALC 73 09/11/2018 0116     Radiology    Dg Chest 2 View  Result Date: 09/10/2018 CLINICAL DATA:  Chest pain radiating to jaw. Coronary artery disease EXAM: CHEST - 2 VIEW COMPARISON:  None. FINDINGS: The heart size and mediastinal contours are within normal limits. Low lung volumes are noted. Both lungs are clear. The visualized skeletal structures are unremarkable. IMPRESSION: No active cardiopulmonary disease. Electronically Signed   By: Earle Gell M.D.   On: 09/10/2018 13:49    Cardiac Studies   Echo pending on this admission  LHC 10-24-17 in [place] [facility] Severe mid right coronary artery disease.  Moderate disease within the obtuse marginal and posterior descending  artery.  Patent stents within the LAD, ramus intermedius, and right coronary  artery.  RECOMMENDATIONS:  This gentleman's chest pain syndrome appears to be related  to the severe  mid right coronary disease. A subsequent pressure wire showed the LAD  disease to be not significant. Dr. Moshe Cipro proceeded with drug-eluting  stent placement to the right coronary artery with good results. Continued  aggressive medical therapy is planned.  TTE 10-23-17 at [place] left ventricular wall motion and contractility are within normal Limits. EF 50-55%. There is a trace of mitral regurgitation. There is mild tricuspid regurgitation. There is no pericardial effusion.     Patient Profile     58 y.o. male extensive coronary artery disease and multiple previous revascularization procedures most recently drug-eluting stent to the mid right coronary artery February 2019, known moderate disease in the LAD, OM, PDA; presenting with unstable angina and a recent syncopal event  Assessment & Plan    1. CAD/USA: Normal cardiac enzymes but abnormal ECG.  On intravenous heparin, aspirin, clopidogrel.  On chronic beta-blocker and statin therapy.  For cardiac catheterization on this admission, but unfortunately today's Cath Lab schedule looks very busy.  Might have to delay until tomorrow.  Will allow him to eat breakfast and hold lunch until we know whether or not he will be able to have the study performed today. This procedure has been fully reviewed with the patient and written informed consent has been obtained. He reports failed attempt at left heart catheterization via right radial approach due to "angled vessels", but successful radial approach catheterization from the left wrist. 2. HLP: Note extremely low HDL cholesterol.  LDL cholesterol very close to target range of less than 70, but if he has evidence of progression of disease would add ezetimibe or even a PCSK9 inhibitor. 3. DM: He has at least prediabetes.  He is obese.  Hemoglobin A1c is 6.4%, fasting glucose is 110, not currently on medications.  He walks a mile a day when it does not rain.  He generally  appears well informed about a healthy diet, but has been eating bananas for the extra potassium.  We will ask for dietitian consult.  For questions or updates, please contact [place] Please consult [email or web address] for contact info under        Signed, Sanda Klein, MD  09/11/2018, 8:11 AM

## 2018-09-11 NOTE — Progress Notes (Signed)
ANTICOAGULATION CONSULT NOTE - Follow-up Consult  Pharmacy Consult for Heparin Indication: chest pain/ACS  No Known Allergies  Patient Measurements: Height: 5\' 9"  (175.3 cm) Weight: 212 lb 4.9 oz (96.3 kg) IBW/kg (Calculated) : 70.7 HEPARIN DW (KG): 90.8  Vital Signs: Temp: 98.8 F (37.1 C) (01/13 0745) Temp Source: Oral (01/13 0745) BP: 143/82 (01/13 0928) Pulse Rate: 82 (01/13 0928)  Labs: Recent Labs    09/10/18 1245 09/10/18 1250  09/10/18 1953 09/10/18 2141 09/11/18 0116 09/11/18 0651  HGB  --  15.0  --   --   --  14.4  --   HCT  --  45.6  --   --   --  42.8  --   PLT  --  261  --   --   --  215  --   APTT 27  --   --   --   --   --   --   LABPROT 13.2  --   --   --   --   --  13.7  INR 1.01  --   --   --   --   --  1.06  HEPARINUNFRC  --   --   --   --  0.33 0.39  --   CREATININE  --  0.91  --   --   --   --  0.80  TROPONINI  --   --    < > <0.03  --  <0.03 <0.03   < > = values in this interval not displayed.    Estimated Creatinine Clearance: 118 mL/min (by C-G formula based on SCr of 0.8 mg/dL).   Assessment: Patient presented to ED with difficulty in breathing and CP earlier this morning at church. Patient also noted he had neck and jaw pain. Pharmacy asked to start heparin.  Heparin level therapeutic at 0.39, CBC stable. Cardiology planning heart cath, likely tomorrow 1/14.  Goal of Therapy:  Heparin level 0.3-0.7 units/ml Monitor platelets by anticoagulation protocol: Yes   Plan:  -Continue heparin 1100 units/hr -Daily heparin level and CBC  Arrie Senate, PharmD, BCPS Clinical Pharmacist [phone] Please check AMION for all [facility] Pharmacy numbers 09/11/2018

## 2018-09-11 NOTE — Plan of Care (Signed)
  Problem: Cardiac: Goal: Ability to achieve and maintain adequate cardiovascular perfusion will improve Outcome: Progressing   Problem: Health Behavior/Discharge Planning: Goal: Ability to safely manage health-related needs after discharge will improve Outcome: Progressing   

## 2018-09-11 NOTE — Progress Notes (Signed)
TR Band removed and tolerated well. 2x2 gauze and tegaderm applied. No bleeding noted. Continue to monitor.

## 2018-09-11 NOTE — Progress Notes (Signed)
Transported to the cath lab by  Bed, awake and alert.

## 2018-09-11 NOTE — Progress Notes (Signed)
Back from the cath. Lab by bed awake and alert. TR Band to left wrist intact, elevated with pillow. Pulse ox to left thumb. Instructed to avoid  Moving left arm cont. To monitor.

## 2018-09-11 NOTE — Progress Notes (Signed)
  Echocardiogram 2D Echocardiogram has been performed.  William Mcmahon 09/11/2018, 11:34 AM

## 2018-09-11 NOTE — Progress Notes (Signed)
Progress Note  Patient Name: William Mcmahon Date of Encounter: 09/11/2018  Primary Cardiologist: No primary care provider on file. New (lives in [place])  Subjective   No further angina since admission.Marland Kitchen He reports a syncopal event last Tuesday (January 7), preceded by a sensation of heat, but without palpitations or angina or diaphoresis.  Does not have a previous history of angina.  He has a small scalp laceration from that event. His anginal episode that led to admission a couple of days ago was resolved with 2 sublingual nitroglycerin, again followed by brief syncope during transportation by EMS.  Inpatient Medications    Scheduled Meds: . amLODipine  10 mg Oral Daily  . aspirin  324 mg Oral NOW   Or  . aspirin  300 mg Rectal NOW  . aspirin EC  81 mg Oral Daily  . atorvastatin  80 mg Oral Daily  . carvedilol  12.5 mg Oral BID WC  . clopidogrel  75 mg Oral Daily  . hydrochlorothiazide  12.5 mg Oral Daily  . levothyroxine  125 mcg Oral QAC breakfast   Continuous Infusions: . heparin 1,100 Units/hr (09/10/18 2035)   PRN Meds: acetaminophen, diazepam, nitroGLYCERIN, ondansetron (ZOFRAN) IV   Vital Signs    Vitals:   09/10/18 1929 09/10/18 2336 09/11/18 0419 09/11/18 0745  BP: 122/83 129/78 (!) 143/78   Pulse: (!) 58 (!) 51    Resp: 17 14 (!) 23   Temp: 98.2 F (36.8 C) 98.3 F (36.8 C) 97.9 F (36.6 C) 98.8 F (37.1 C)  TempSrc: Oral Oral Oral Oral  SpO2: 97% 95% 100%   Weight:      Height:        Intake/Output Summary (Last 24 hours) at 09/11/2018 0811 Last data filed at 09/11/2018 0700 Gross per 24 hour  Intake 2331 ml  Output -  Net 2331 ml   Last 3 Weights 09/10/2018 09/10/2018  Weight (lbs) 212 lb 4.9 oz 225 lb  Weight (kg) 96.3 kg 102.059 kg      Telemetry    NSR - Personally Reviewed  ECG    Sinus rhythm, anterior T wave inversion- Personally Reviewed  Physical Exam  Appears comfortable lying fully flat in bed GEN: No acute distress.     Neck: No JVD Cardiac: RRR, no murmurs, rubs, or gallops.  Respiratory: Clear to auscultation bilaterally. GI: Soft, nontender, non-distended  MS: No edema; No deformity. Neuro:  Nonfocal  Psych: Normal affect   Labs    Chemistry Recent Labs  Lab 09/10/18 1250 09/11/18 0651  NA 137 141  K 3.0* 3.0*  CL 105 109  CO2 23 24  GLUCOSE 226* 110*  BUN 11 6  CREATININE 0.91 0.80  CALCIUM 9.0 8.6*  GFRNONAA >60 >60  GFRAA >60 >60  ANIONGAP 9 8     Hematology Recent Labs  Lab 09/10/18 1250 09/11/18 0116  WBC 8.0 7.6  RBC 5.11 4.81  HGB 15.0 14.4  HCT 45.6 42.8  MCV 89.2 89.0  MCH 29.4 29.9  MCHC 32.9 33.6  RDW 12.1 11.9  PLT 261 215    Cardiac Enzymes Recent Labs  Lab 09/10/18 1418 09/10/18 1953 09/11/18 0116 09/11/18 0651  TROPONINI <0.03 <0.03 <0.03 <0.03    Recent Labs  Lab 09/10/18 1248  TROPIPOC 0.00     Lipid Panel     Component Value Date/Time   CHOL 113 09/11/2018 0116   TRIG 73 09/11/2018 0116   HDL 25 (L) 09/11/2018 0116   CHOLHDL  4.5 09/11/2018 0116   VLDL 15 09/11/2018 0116   LDLCALC 73 09/11/2018 0116     Radiology    Dg Chest 2 View  Result Date: 09/10/2018 CLINICAL DATA:  Chest pain radiating to jaw. Coronary artery disease EXAM: CHEST - 2 VIEW COMPARISON:  None. FINDINGS: The heart size and mediastinal contours are within normal limits. Low lung volumes are noted. Both lungs are clear. The visualized skeletal structures are unremarkable. IMPRESSION: No active cardiopulmonary disease. Electronically Signed   By: Earle Gell M.D.   On: 09/10/2018 13:49    Cardiac Studies   Echo pending on this admission  LHC 10-24-17 in [place] [facility] Severe mid right coronary artery disease.  Moderate disease within the obtuse marginal and posterior descending  artery.  Patent stents within the LAD, ramus intermedius, and right coronary  artery.  RECOMMENDATIONS:  This gentleman's chest pain syndrome appears to be related  to the severe  mid right coronary disease. A subsequent pressure wire showed the LAD  disease to be not significant. Dr. Moshe Cipro proceeded with drug-eluting  stent placement to the right coronary artery with good results. Continued  aggressive medical therapy is planned.  TTE 10-23-17 at [place] left ventricular wall motion and contractility are within normal Limits. EF 50-55%. There is a trace of mitral regurgitation. There is mild tricuspid regurgitation. There is no pericardial effusion.     Patient Profile     58 y.o. male extensive coronary artery disease and multiple previous revascularization procedures most recently drug-eluting stent to the mid right coronary artery February 2019, known moderate disease in the LAD, OM, PDA; presenting with unstable angina and a recent syncopal event  Assessment & Plan    1. CAD/USA: Normal cardiac enzymes but abnormal ECG.  On intravenous heparin, aspirin, clopidogrel.  On chronic beta-blocker and statin therapy.  For cardiac catheterization on this admission, but unfortunately today's Cath Lab schedule looks very busy.  Might have to delay until tomorrow.  Will allow him to eat breakfast and hold lunch until we know whether or not he will be able to have the study performed today. This procedure has been fully reviewed with the patient and written informed consent has been obtained. He reports failed attempt at left heart catheterization via right radial approach due to "angled vessels", but successful radial approach catheterization from the left wrist. 2. HLP: Note extremely low HDL cholesterol.  LDL cholesterol very close to target range of less than 70, but if he has evidence of progression of disease would add ezetimibe or even a PCSK9 inhibitor. 3. DM: He has at least prediabetes.  He is obese.  Hemoglobin A1c is 6.4%, fasting glucose is 110, not currently on medications.  He walks a mile a day when it does not rain.  He generally  appears well informed about a healthy diet, but has been eating bananas for the extra potassium.  We will ask for dietitian consult.  For questions or updates, please contact [place] Please consult [email or web address] for contact info under        Signed, Sanda Klein, MD  09/11/2018, 8:11 AM

## 2018-09-11 NOTE — Plan of Care (Signed)
Nutrition Education Note  RD consulted for nutrition education regarding CHF and diabetes.  Lab Results  Component Value Date   HGBA1C 6.4 (H) 09/10/2018  PTA no medications.    Spoke with pt, wife, and granddaughter. Pt shares he made drastic lifestyle changes after his heart attack approximately 8 years ago. He typically consumes 2 meals per day (Breakfast: toast OR biscuit OR eggs and sausage, Dinner: chicken, baked potato, pinot beans OR deer meat chili). He snacks on bananas for snacks. Beverages consist of water, tea, and occasional mountain dew. He walks at least one mile daily and does push ups during times of bad weather.   Pt shares he has a family hx of diabetes in his mother, but was not informed of any blood sugar problems until today (last PCP appointment was 2-3 months ago). Spent most of visit discussing ways to modify diet to assist with heart and blood sugar control. Emphasized importance of continued self-management to prevent further complications.   Case discussed with RN.   RD provided "Heart Healthy, Consistent Carbohydrate Nutrition Therapy" handout from the [employer] of [employer]. Reviewed patient's dietary recall. Provided examples on ways to decrease sodium intake in diet. Discouraged intake of processed foods and use of salt shaker. Encouraged fresh fruits and vegetables as well as whole grain sources of carbohydrates to maximize fiber intake.   RD discussed why it is important for patient to adhere to diet recommendations, and emphasized the role of fluids, foods to avoid, and importance of weighing self daily.   Discussed different food groups and their effects on blood sugar, emphasizing carbohydrate-containing foods. Provided list of carbohydrates and recommended serving sizes of common foods.  Discussed importance of controlled and consistent carbohydrate intake throughout the day. Provided examples of ways to balance meals/snacks and encouraged  intake of high-fiber, whole grain complex carbohydrates. Teach back method used.  Expect fair to good compliance.  Body mass index is 31.35 kg/m. Pt meets criteria for obesity, class I based on current BMI.  Current diet order is carb modified, patient is consuming approximately 100% of meals at this time. Labs and medications reviewed. No further nutrition interventions warranted at this time. RD contact information provided. If additional nutrition issues arise, please re-consult RD.   William Mcmahon, RD, LDN, CDE Pager: [phone] After hours Pager: [phone]

## 2018-09-12 ENCOUNTER — Encounter (HOSPITAL_COMMUNITY): Payer: Self-pay | Admitting: Cardiology

## 2018-09-12 ENCOUNTER — Telehealth: Payer: Self-pay | Admitting: Adult Health

## 2018-09-12 DIAGNOSIS — I25110 Atherosclerotic heart disease of native coronary artery with unstable angina pectoris: Secondary | ICD-10-CM | POA: Diagnosis not present

## 2018-09-12 DIAGNOSIS — E119 Type 2 diabetes mellitus without complications: Secondary | ICD-10-CM

## 2018-09-12 DIAGNOSIS — I2510 Atherosclerotic heart disease of native coronary artery without angina pectoris: Secondary | ICD-10-CM | POA: Diagnosis present

## 2018-09-12 DIAGNOSIS — Z955 Presence of coronary angioplasty implant and graft: Secondary | ICD-10-CM | POA: Diagnosis not present

## 2018-09-12 DIAGNOSIS — E876 Hypokalemia: Secondary | ICD-10-CM | POA: Diagnosis not present

## 2018-09-12 DIAGNOSIS — R55 Syncope and collapse: Secondary | ICD-10-CM

## 2018-09-12 DIAGNOSIS — E785 Hyperlipidemia, unspecified: Secondary | ICD-10-CM | POA: Diagnosis present

## 2018-09-12 DIAGNOSIS — I200 Unstable angina: Secondary | ICD-10-CM | POA: Diagnosis not present

## 2018-09-12 DIAGNOSIS — T82858A Stenosis of vascular prosthetic devices, implants and grafts, initial encounter: Secondary | ICD-10-CM | POA: Diagnosis not present

## 2018-09-12 HISTORY — DX: Syncope and collapse: R55

## 2018-09-12 HISTORY — DX: Hyperlipidemia, unspecified: E78.5

## 2018-09-12 HISTORY — DX: Type 2 diabetes mellitus without complications: E11.9

## 2018-09-12 HISTORY — DX: Atherosclerotic heart disease of native coronary artery without angina pectoris: I25.10

## 2018-09-12 LAB — BASIC METABOLIC PANEL
Anion gap: 7 (ref 5–15)
Anion gap: 8 (ref 5–15)
BUN: 8 mg/dL (ref 6–20)
BUN: 9 mg/dL (ref 6–20)
CALCIUM: 9 mg/dL (ref 8.9–10.3)
CO2: 25 mmol/L (ref 22–32)
CO2: 27 mmol/L (ref 22–32)
CREATININE: 0.94 mg/dL (ref 0.61–1.24)
Calcium: 8.7 mg/dL — ABNORMAL LOW (ref 8.9–10.3)
Chloride: 108 mmol/L (ref 98–111)
Chloride: 107 mmol/L (ref 98–111)
Creatinine, Ser: 0.84 mg/dL (ref 0.61–1.24)
GFR calc Af Amer: >60 mL/min (ref >60)
GFR calc Af Amer: >60 mL/min (ref >60)
GFR calc non Af Amer: >60 mL/min (ref >60)
GFR calc non Af Amer: >60 mL/min (ref >60)
Glucose, Bld: 130 mg/dL — ABNORMAL HIGH (ref 70–99)
Glucose, Bld: 129 mg/dL — ABNORMAL HIGH (ref 70–99)
Potassium: 2.9 mmol/L — ABNORMAL LOW (ref 3.5–5.1)
Potassium: 3.6 mmol/L (ref 3.5–5.1)
Sodium: 140 mmol/L (ref 135–145)
Sodium: 142 mmol/L (ref 135–145)

## 2018-09-12 LAB — CBC
HCT: 42.3 % (ref 39.0–52.0)
Hemoglobin: 14.5 g/dL (ref 13.0–17.0)
MCH: 30.7 pg (ref 26.0–34.0)
MCHC: 34.3 g/dL (ref 30.0–36.0)
MCV: 89.6 fL (ref 80.0–100.0)
Platelets: 241 10*3/uL (ref 150–400)
RBC: 4.72 MIL/uL (ref 4.22–5.81)
RDW: 11.9 % (ref 11.5–15.5)
WBC: 7.5 10*3/uL (ref 4.0–10.5)
nRBC: 0 % (ref 0.0–0.2)

## 2018-09-12 MED ORDER — ISOSORBIDE MONONITRATE ER 30 MG PO TB24
30.0000 mg | ORAL_TABLET | Freq: Every day | ORAL | Status: DC
  Administered 2018-09-12: 30 mg via ORAL
  Filled 2018-09-12: qty 1

## 2018-09-12 MED ORDER — ASPIRIN 81 MG PO TBEC: 81.0000 mg | DELAYED_RELEASE_TABLET | Freq: Every day | ORAL | Status: DC

## 2018-09-12 MED ORDER — ISOSORBIDE MONONITRATE ER 30 MG PO TB24: 30.0000 mg | ORAL_TABLET | Freq: Every day | ORAL | 6 refills | Status: DC

## 2018-09-12 MED ORDER — POTASSIUM CHLORIDE CRYS ER 20 MEQ PO TBCR
40.0000 meq | EXTENDED_RELEASE_TABLET | Freq: Two times a day (BID) | ORAL | Status: DC
  Administered 2018-09-12: 40 meq via ORAL
  Filled 2018-09-12: qty 2

## 2018-09-12 MED ORDER — ACETAMINOPHEN 325 MG PO TABS: 650.0000 mg | ORAL_TABLET | ORAL | Status: DC | PRN

## 2018-09-12 MED ORDER — POTASSIUM CHLORIDE CRYS ER 20 MEQ PO TBCR
30.0000 meq | EXTENDED_RELEASE_TABLET | Freq: Three times a day (TID) | ORAL | Status: DC
  Filled 2018-09-12: qty 1

## 2018-09-12 NOTE — Discharge Instructions (Signed)
Call [facility] at [phone] if any bleeding, swelling or drainage at cath site.  May shower, no tub baths for 48 hours for groin sticks. No lifting over 5 pounds for 3 days.  No Driving for 3 days  Heart Healthy diabetic diet  Call if any problems,  We stopped the HCTZ and the lisinopril.

## 2018-09-12 NOTE — Progress Notes (Signed)
Pt provided with verbal discharge instructions. Paper copy of AVS provided. No further questions. Wife at bedside during d/c.  VSS at d/c.  IV removed per orders.  [place] sent with patient. Patient discharge by tech via wheelchair through employee entrance.

## 2018-09-12 NOTE — Discharge Summary (Signed)
Discharge Summary    Patient ID: William Mcmahon MRN: [id number]; DOB: Jul 15, 1961  Admit date: 09/10/2018 Discharge date: 09/12/2018  Primary Care Provider: System, [place] Not In  Primary Cardiologist: Minus Breeding, MD  Primary Electrophysiologist:  None   Discharge Diagnoses    Principal Problem:   Unstable angina [facility]) Active Problems:   CAD in native artery, with prior stents, and instent restenosis of small vessel, medical therapy, LAD and RCA stents are patent   HLD (hyperlipidemia)   DM (diabetes mellitus), type 2 ([place]) diet controlled   Hypokalemia   Syncope- may be due to diuretics .    Allergies No Known Allergies  Diagnostic Studies/Procedures    Cardiac catheterization September 11, 2018  The left ventricular systolic function is normal. The left ventricular ejection fraction is 55-65% by visual estimate. LV end diastolic pressure is normal.  RCAl Ost RCA to Mid RCA overlapping stents (proximal from 2013, mid from 2019) -- 5% stenosed.  --- Mid RCA to Dist RCA lesion is 30% stenosed with 30% stenosed side branch in Acute Mrg.  --- Ost RPDA lesion is 75% stenosed. 2nd RPLB lesion is 70% stenosed.  LAD: Prox LAD stent (Xience 3.25 mm x 18 mm-2018) is 5% stenosed.  -- Prox LAD to Mid LAD lesion is 55% stenosed - after D1.  Ost Ramus lesion is 99% stenosed. Ost Ramus to Ramus stent (Xience 2.25 mm x 15 mm-2018) is 65% stenosed.  Mid Cx to Dist Cx lesion is 50% stenosed. Dist Cx lesion is 60% stenosed.  SUMMARY  Culprit lesion is likely the severe in-stent restenosis and near occlusion of the proximal diagonal-ramus intermedius that was previously treated with a DES stent --> not a viable option for re-PCI as I cannot find the ostium of this vessel.  Patent LAD and RCA stents.  Diffuse mild disease in the RCA with previously reported roughly 80% ostial PDA.  Diffuse mild to moderate disease in the mid LAD with roughly long segment of 50% after major diagonal  branch.  Tandem moderate lesions in major OM branch.  Normal LVEF and normal EDP.  Films reviewed with Dr. Burt Knack, we both felt that the small ramus intermedius branch is not viable for reattempted PCI. The other remaining lesions are moderate and we would be best served treated medically. Recommendation would be to continue to manage medically per primary team.  RECOMMENDATION  Return to nursing unit with ongoing care. TR band removal per protocol. Anticipate discharge tomorrow morning based on the late case.  Continue aggressive risk factor modification  Continue titrating antianginal medications -would consider Ranexa and or Imdur as he is already on good doses of carvedilol and amlodipine.   Echo 09/12/18 Study Conclusions  - Left ventricle: The cavity size was normal. Systolic function was   normal. The estimated ejection fraction was in the range of 55%   to 60%. Wall motion was normal; there were no regional wall   motion abnormalities. Doppler parameters are consistent with   abnormal left ventricular relaxation (grade 1 diastolic   dysfunction). - Pulmonary arteries: PA peak pressure: 32 mm Hg (S). _____________   History of Present Illness     58 YO M with hx of CAD and prior PCIs last 09/2017 at [facility] with PCI to RCA 09/2017.  Now pt in [place] and needs cardiologist.  He presented to ER 09/10/18 with CP and jaw pain, began the AM of admit, began suddenly in church associated with dizziness and lightheadedness and feeling he may  pass out.  NTG with improvement.  No pain prior to day of admit.  He did admit to syncope on 09/05/18 preceded by sensation of heat.   He was seen and admitted with IV Heparin, plans for cardiac cath the next day. Troponin neg.  Hospital Course     Consultants: none  His troponins were neg.  He was hypokalemic on admit and throughout hospitalization.  His HCTZ was stopped for hypokalemia and syncope.  He underwent cardiac cath as above.   Pt is uninsured so most likely cannot afford Ranolazine.  On max BB and amlodipine.  For his hyperlipidemia he is on statin high dose, and LDL is close to target range.  Doubt he can afford PCSK9.  Continue with lifestyle changes.     Hypokalemia improved at discharge and we stopped HCTZ.  If he needs diuretic Dr. Jerilynn Mages. Croitoru recommends thiazide diuretic and placed on combination drug with spironolactone or triamterene.  For his syncope most likely vasovagal, Dr. Sallyanne Kuster discusses ways to prevent events.  Another reason to keep off diuretics.   Pt has been seen and found stable for discharge by Dr. Sallyanne Kuster.  He has follow up TOC in [place] and then to see Dr. Vita Barley in [place] which is easier drive for him.     _____________  Discharge Vitals Blood pressure 113/76, pulse 95, temperature 98.4 F (36.9 C), temperature source Oral, resp. rate 19, height 5\' 9"  (1.753 m), weight 96.3 kg, SpO2 92 %.  Filed Weights   09/10/18 1241 09/10/18 1730  Weight: 102.1 kg 96.3 kg    Labs & Radiologic Studies    CBC Recent Labs    09/11/18 0116 09/12/18 0256  WBC 7.6 7.5  HGB 14.4 14.5  HCT 42.8 42.3  MCV 89.0 89.6  PLT 215 762   Basic Metabolic Panel Recent Labs    09/10/18 1256  09/12/18 0256 09/12/18 1410  NA  --    < > 140 142  K  --    < > 2.9* 3.6  CL  --    < > 108 107  CO2  --    < > 25 27  GLUCOSE  --    < > 130* 129*  BUN  --    < > 8 9  CREATININE  --    < > 0.84 0.94  CALCIUM  --    < > 8.7* 9.0  MG 1.9  --   --   --    < > = values in this interval not displayed.   Liver Function Tests No results for input(s): AST, ALT, ALKPHOS, BILITOT, PROT, ALBUMIN in the last 72 hours. No results for input(s): LIPASE, AMYLASE in the last 72 hours. Cardiac Enzymes Recent Labs    09/10/18 1953 09/11/18 0116 09/11/18 0651  TROPONINI <0.03 <0.03 <0.03   BNP Invalid input(s): POCBNP D-Dimer No results for input(s): DDIMER in the last 72 hours. Hemoglobin A1C Recent  Labs    09/10/18 1953  HGBA1C 6.4*   Fasting Lipid Panel Recent Labs    09/11/18 0116  CHOL 113  HDL 25*  LDLCALC 73  TRIG 73  CHOLHDL 4.5   Thyroid Function Tests Recent Labs    09/10/18 1953  TSH 0.958   _____________  Dg Chest 2 View  Result Date: 09/10/2018 CLINICAL DATA:  Chest pain radiating to jaw. Coronary artery disease EXAM: CHEST - 2 VIEW COMPARISON:  None. FINDINGS: The heart size and mediastinal contours are within normal  limits. Low lung volumes are noted. Both lungs are clear. The visualized skeletal structures are unremarkable. IMPRESSION: No active cardiopulmonary disease. Electronically Signed   By: Earle Gell M.D.   On: 09/10/2018 13:49   Disposition   Pt is being discharged home today in good condition.  Follow-up Plans & Appointments    Follow-up Information    Minus Breeding, MD Follow up on 11/29/2018.   Specialty:  Cardiology Why:  at 11:00AM Contact information: [place] [address] [phone]        Minus Breeding, MD Follow up on 09/25/2018.   Specialty:  Cardiology Why:  at 1:30 pm with his NP-  Dr. Jory Sims   Contact information: [place] [address] [phone]           Call [facility] at [phone] if any bleeding, swelling or drainage at cath site.  May shower, no tub baths for 48 hours for groin sticks. No lifting over 5 pounds for 3 days.  No Driving for 3 days  Heart Healthy diabetic diet  Call if any problems,  We stopped the HCTZ and the lisinopril.   Marland Kitchen   Discharge Medications   Allergies as of 09/12/2018   No Known Allergies     Medication List    STOP taking these medications   BUFFERIN 325 MG Tabs tablet Generic drug:  aspirin buffered   hydrochlorothiazide 12.5 MG capsule Commonly known as:  MICROZIDE   lisinopril 40 MG tablet Commonly known as:  PRINIVIL,ZESTRIL     TAKE these medications   acetaminophen 325 MG  tablet Commonly known as:  TYLENOL Take 2 tablets (650 mg total) by mouth every 4 (four) hours as needed for headache or mild pain.   amLODipine 10 MG tablet Commonly known as:  NORVASC Take 10 mg by mouth daily.   aspirin 81 MG EC tablet Take 1 tablet (81 mg total) by mouth daily. Start taking on:  September 13, 2018   atorvastatin 80 MG tablet Commonly known as:  LIPITOR Take 80 mg by mouth daily.   carvedilol 12.5 MG tablet Commonly known as:  COREG Take 12.5 mg by mouth 2 (two) times daily with a meal.   clopidogrel 75 MG tablet Commonly known as:  PLAVIX Take 75 mg by mouth daily.   isosorbide mononitrate 30 MG 24 hr tablet Commonly known as:  IMDUR Take 1 tablet (30 mg total) by mouth daily. Start taking on:  September 13, 2018   levothyroxine 125 MCG tablet Commonly known as:  SYNTHROID, LEVOTHROID Take 125 mcg by mouth daily before breakfast.   nitroGLYCERIN 0.4 MG SL tablet Commonly known as:  NITROSTAT Place 0.4 mg under the tongue every 5 (five) minutes as needed for chest pain.        Acute coronary syndrome (MI, NSTEMI, STEMI, etc) this admission?: No.    Outstanding Labs/Studies   BMP  Duration of Discharge Encounter   Greater than 30 minutes including physician time.  Signed, Cecilie Kicks, NP 09/12/2018, 5:19 PM

## 2018-09-12 NOTE — Progress Notes (Signed)
Progress Note  Patient Name: William Mcmahon Date of Encounter: 09/12/2018  Primary Cardiologist: No primary care provider on file. New - prefers [place]  Subjective   Feels well, no angina last 48 hours. Angiogram results reviewed.  Symptoms are likely due to subtotal occlusion of the ramus intermedius at previously stented site and there are no good options for revascularization.  The stents in the RCA and LAD are widely patent.  Moderate stenoses are seen in the branches of the RCA and in the mid to distal left circumflex.  Inpatient Medications    Scheduled Meds: . amLODipine  10 mg Oral Daily  . aspirin EC  81 mg Oral Daily  . atorvastatin  80 mg Oral Daily  . carvedilol  12.5 mg Oral BID WC  . clopidogrel  75 mg Oral Daily  . isosorbide mononitrate  30 mg Oral Daily  . levothyroxine  125 mcg Oral QAC breakfast  . potassium chloride  40 mEq Oral BID  . sodium chloride flush  3 mL Intravenous Q12H   Continuous Infusions: . sodium chloride     PRN Meds: sodium chloride, acetaminophen, diazepam, nitroGLYCERIN, ondansetron (ZOFRAN) IV, sodium chloride flush   Vital Signs    Vitals:   09/11/18 2012 09/12/18 0500 09/12/18 0730 09/12/18 0959  BP: (!) 148/79 135/84 131/83 (!) 153/78  Pulse: 60 62 (!) 56 61  Resp: 15 12 14    Temp: 98.5 F (36.9 C) 98.1 F (36.7 C) 98.2 F (36.8 C)   TempSrc: Oral Oral Oral   SpO2: 98% 97% 96%   Weight:      Height:        Intake/Output Summary (Last 24 hours) at 09/12/2018 1017 Last data filed at 09/11/2018 2030 Gross per 24 hour  Intake 1176.4 ml  Output -  Net 1176.4 ml   Last 3 Weights 09/10/2018 09/10/2018  Weight (lbs) 212 lb 4.9 oz 225 lb  Weight (kg) 96.3 kg 102.059 kg      Telemetry    Sinus rhythm- Personally Reviewed  ECG    Sinus bradycardia, no repolarization abnormalities - Personally Reviewed  Physical Exam  Appears well.  Healthy left radial catheterization access site GEN: No acute distress.   Neck: No  JVD Cardiac: RRR, no murmurs, rubs, or gallops.  Respiratory: Clear to auscultation bilaterally. GI: Soft, nontender, non-distended  MS: No edema; No deformity. Neuro:  Nonfocal  Psych: Normal affect   Labs    Chemistry Recent Labs  Lab 09/10/18 1250 09/11/18 0651 09/12/18 0256  NA 137 141 140  K 3.0* 3.0* 2.9*  CL 105 109 108  CO2 23 24 25   GLUCOSE 226* 110* 130*  BUN 11 6 8   CREATININE 0.91 0.80 0.84  CALCIUM 9.0 8.6* 8.7*  GFRNONAA >60 >60 >60  GFRAA >60 >60 >60  ANIONGAP 9 8 7      Hematology Recent Labs  Lab 09/10/18 1250 09/11/18 0116 09/12/18 0256  WBC 8.0 7.6 7.5  RBC 5.11 4.81 4.72  HGB 15.0 14.4 14.5  HCT 45.6 42.8 42.3  MCV 89.2 89.0 89.6  MCH 29.4 29.9 30.7  MCHC 32.9 33.6 34.3  RDW 12.1 11.9 11.9  PLT 261 215 241    Cardiac Enzymes Recent Labs  Lab 09/10/18 1418 09/10/18 1953 09/11/18 0116 09/11/18 0651  TROPONINI <0.03 <0.03 <0.03 <0.03    Recent Labs  Lab 09/10/18 1248  TROPIPOC 0.00    Lipid Panel     Component Value Date/Time   CHOL 113 09/11/2018 0116  TRIG 73 09/11/2018 0116   HDL 25 (L) 09/11/2018 0116   CHOLHDL 4.5 09/11/2018 0116   VLDL 15 09/11/2018 0116   LDLCALC 73 09/11/2018 0116    BNPNo results for input(s): BNP, PROBNP in the last 168 hours.   DDimer No results for input(s): DDIMER in the last 168 hours.   Radiology    Dg Chest 2 View  Result Date: 09/10/2018 CLINICAL DATA:  Chest pain radiating to jaw. Coronary artery disease EXAM: CHEST - 2 VIEW COMPARISON:  None. FINDINGS: The heart size and mediastinal contours are within normal limits. Low lung volumes are noted. Both lungs are clear. The visualized skeletal structures are unremarkable. IMPRESSION: No active cardiopulmonary disease. Electronically Signed   By: Earle Gell M.D.   On: 09/10/2018 13:49    Cardiac Studies  ECHO September 11 2018 Cardiac catheterization September 11, 2018  The left ventricular systolic function is normal. The left  ventricular ejection fraction is 55-65% by visual estimate. LV end diastolic pressure is normal.  RCAl Ost RCA to Mid RCA overlapping stents (proximal from 2013, mid from 2019) -- 5% stenosed.  --- Mid RCA to Dist RCA lesion is 30% stenosed with 30% stenosed side branch in Acute Mrg.  --- Ost RPDA lesion is 75% stenosed. 2nd RPLB lesion is 70% stenosed.  LAD: Prox LAD stent (Xience 3.25 mm x 18 mm-2018) is 5% stenosed.  -- Prox LAD to Mid LAD lesion is 55% stenosed - after D1.  Ost Ramus lesion is 99% stenosed. Ost Ramus to Ramus stent (Xience 2.25 mm x 15 mm-2018) is 65% stenosed.  Mid Cx to Dist Cx lesion is 50% stenosed. Dist Cx lesion is 60% stenosed.   SUMMARY  Culprit lesion is likely the severe in-stent restenosis and near occlusion of the proximal diagonal-ramus intermedius that was previously treated with a DES stent --> not a viable option for re-PCI as I cannot find the ostium of this vessel.  Patent LAD and RCA stents.  Diffuse mild disease in the RCA with previously reported roughly 80% ostial PDA.  Diffuse mild to moderate disease in the mid LAD with roughly long segment of 50% after major diagonal branch.  Tandem moderate lesions in major OM branch.  Normal LVEF and normal EDP.   Films reviewed with Dr. Burt Knack, we both felt that the small ramus intermedius branch is not viable for reattempted PCI.  The other remaining lesions are moderate and we would be best served treated medically. Recommendation would be to continue to manage medically per primary team.  RECOMMENDATION  Return to nursing unit with ongoing care.  TR band removal per protocol.  Anticipate discharge tomorrow morning based on the late case.  Continue aggressive risk factor modification  Continue titrating antianginal medications -would consider Ranexa and or Imdur as he is already on good doses of carvedilol and amlodipine.   Patient Profile     58 y.o. male with extensive coronary artery  disease and multiple previous revascularization procedures most recently drug-eluting stent to the mid right coronary artery February 2019, known moderate disease in the LAD, OM, PDA; presenting with unstable angina and a recent syncopal event.  Found to have subtotal occlusion of the ramus intermedius stent, moderate to severe hypokalemia.  Assessment & Plan    1. CAD/USA:  On chronic dual antiplatelet therapy with aspirin and clopidogrel. On maximum dose amlodipine and maximum tolerated beta-blocker (sinus bradycardia).  We will add long-acting nitrates.  He is uninsured and will not be able  to afford ranolazine.  He is on effective statin therapy.   2. HLP: Note extremely low HDL cholesterol.  LDL cholesterol very close to target range of less than 70. PCSK9 likely unaffordable and I think with additional lifestyle changes he should be able to push his LDL cholesterol down and improve his HDL further. 3. DM: He has at least prediabetes.  He is obese.  Hemoglobin A1c is 6.4%, fasting glucose is 110, not currently on medications.  He walks a mile a day when it does not rain.  He generally appears well informed about a healthy diet, but has been eating bananas for the extra potassium.  Had a lengthy discussion regarding healthy diet, richer in lean protein and unsaturated fat, lower in saturated fat and carbs with high glycemic index. 4. Hypokalemia: Replace with oral potassium, hold hydrochlorothiazide until his follow-up appointment.  He reports developing swelling in his left ankle if he does not take a diuretic.  If we do restart a thiazide diuretic, he should be probably placed on combination drug with spironolactone or triamterene. 5.  Syncope: No evidence of any ventricular arrhythmia during several days of hospital monitoring, even in face of hypokalemia.  Some of the features he describes are strongly suggestive of vasovagal syncope.  Discussed ways to prevent events and mitigate symptoms.  Ideally  we should keep him off of diuretics to lessen the events.  Discharge later today if his potassium is closer to normal range.  Keep off hydrochlorothiazide until follow-up visit. Encouraged walking in the halls to make sure he does not develop angina with activity, prior to discharge. He prefers follow-up in [place] if possible.  He understands that his first follow-up appointment will have to be bleeding [place] due to the short notice.  Check a basic metabolic panel at follow-up TOC visit in 2 weeks.  For questions or updates, please contact [facility] Please consult [email or web address] for contact info under        Signed, Sanda Klein, MD  09/12/2018, 10:17 AM

## 2018-09-12 NOTE — Telephone Encounter (Signed)
Patient currently admitted

## 2018-09-12 NOTE — Telephone Encounter (Signed)
New Message   [facility] appointment made on 1/27 at 1:30 with Jory Sims per Cecilie Kicks

## 2018-09-12 NOTE — Progress Notes (Signed)
Patients potassium 2.9 this morning. Text paged  Dr. Sallyanne Kuster to advise. Awaiting orders. RN will continue to monitor.

## 2018-09-13 NOTE — Telephone Encounter (Signed)
Patient contacted regarding discharge from [facility] on 09/14/18.  Patient understands to follow up with provider Jory Sims, DNP on 09/25/18 at 1:30am at [facility]. Patient understands discharge instructions? yes Patient understands medications and regiment? yes Patient understands to bring all medications to this visit? yes

## 2018-09-24 NOTE — Progress Notes (Signed)
Cardiology Office Note   Date:  09/25/2018   ID:  William Mcmahon, DOB 02-08-1961, MRN [id number]  PCP:  System, Pcp Not In  Cardiologist:  Hochrein   Chief Complaint  Patient presents with  . Hospitalization Follow-up     History of Present Illness: William Mcmahon is a 58 y.o. male who presents for post hospitalization for unstable angina, with known history of CAD with prior stents and instent restenosis of small vessel, medical therapy, with most LAD and RCA stents are patent.   He presented to ER with complaints of chest pain and was initially found to be hypokalemic which was repleted. He subsequently was scheduled for cardiac cath. He did not have an viable targets for PCI. He was placed on BB and amlodipine, and placed on high dose and LDL. The patient is uninsured and therefore medications are limited to his income.   He is without complaints today. He sometimes has chest pain with exertion, but not always, usually rest will help. He is medically compliant. He is walking daily around a 1-2 miles without recurrent chest pain, but heavy exertion almost always causes pain.   Past Medical History:  Diagnosis Date  . CAD in native artery, with prior stents, and instent restenosis of small vessel, medical therapy, LAD and RCA stents are patent 09/12/2018  . Cancer ([place])    thyroid  . Coronary artery disease    hx of stents  . DM (diabetes mellitus), type 2 ([place]) diet controlled 09/12/2018  . HLD (hyperlipidemia) 09/12/2018  . Hyperlipidemia   . Hypertension   . Syncope- may be due to diuretics 09/12/2018  . Thyroid disease     Past Surgical History:  Procedure Laterality Date  . CARDIAC SURGERY     stents  . CHOLECYSTECTOMY    . LEFT HEART CATH AND CORONARY ANGIOGRAPHY N/A 09/11/2018   Procedure: LEFT HEART CATH AND CORONARY ANGIOGRAPHY;  Surgeon: Leonie Man, MD;  Location: [facility];  Service: Cardiovascular;  Laterality: N/A;     Current Outpatient Medications   Medication Sig Dispense Refill  . acetaminophen (TYLENOL) 325 MG tablet Take 2 tablets (650 mg total) by mouth every 4 (four) hours as needed for headache or mild pain.    Marland Kitchen amLODipine (NORVASC) 10 MG tablet Take 10 mg by mouth daily.    Marland Kitchen aspirin EC 81 MG EC tablet Take 1 tablet (81 mg total) by mouth daily.    Marland Kitchen atorvastatin (LIPITOR) 80 MG tablet Take 80 mg by mouth daily.    . carvedilol (COREG) 12.5 MG tablet Take 12.5 mg by mouth 2 (two) times daily with a meal.    . clopidogrel (PLAVIX) 75 MG tablet Take 75 mg by mouth daily.    . isosorbide mononitrate (IMDUR) 30 MG 24 hr tablet Take 1 tablet (30 mg total) by mouth daily. 30 tablet 6  . levothyroxine (SYNTHROID, LEVOTHROID) 125 MCG tablet Take 125 mcg by mouth daily before breakfast.    . nitroGLYCERIN (NITROSTAT) 0.4 MG SL tablet Place 0.4 mg under the tongue every 5 (five) minutes as needed for chest pain.     No current facility-administered medications for this visit.     Allergies:   Patient has no known allergies.    Social History:  The patient  reports that he has quit smoking. He has never used smokeless tobacco. He reports that he does not drink alcohol or use drugs.   Family History:  The patient's family history is not  on file.    ROS: All other systems are reviewed and negative. Unless otherwise mentioned in H&P    PHYSICAL EXAM: VS:  BP 112/68 (BP Location: Left Arm, Patient Position: Sitting)   Pulse 63   Ht 5\' 11"  (1.803 m)   Wt 216 lb 12.8 oz (98.3 kg)   BMI 30.24 kg/m  , BMI Body mass index is 30.24 kg/m. GEN: Well nourished, well developed, in no acute distress HEENT: normal Neck: no JVD, carotid bruits, or masses Cardiac: RRR; no murmurs, rubs, or gallops,no edema  Respiratory:  Clear to auscultation bilaterally, normal work of breathing GI: soft, nontender, nondistended, + BS MS: no deformity or atrophy Skin: warm and dry, no rash Neuro:  Strength and sensation are intact Psych: euthymic mood,  full affect   EKG:  Not completed this office visit.   Recent Labs: 09/10/2018: Magnesium 1.9; TSH 0.958 09/12/2018: BUN 9; Creatinine, Ser 0.94; Hemoglobin 14.5; Platelets 241; Potassium 3.6; Sodium 142    Lipid Panel    Component Value Date/Time   CHOL 113 09/11/2018 0116   TRIG 73 09/11/2018 0116   HDL 25 (L) 09/11/2018 0116   CHOLHDL 4.5 09/11/2018 0116   VLDL 15 09/11/2018 0116   LDLCALC 73 09/11/2018 0116      Wt Readings from Last 3 Encounters:  09/25/18 216 lb 12.8 oz (98.3 kg)  09/10/18 212 lb 4.9 oz (96.3 kg)      Other studies Reviewed: Cardiac catheterization September 11, 2018  The left ventricular systolic function is normal. The left ventricular ejection fraction is 55-65% by visual estimate. LV end diastolic pressure is normal.  RCAl Ost RCA to Mid RCA overlapping stents (proximal from 2013, mid from 2019) -- 5% stenosed.  --- Mid RCA to Dist RCA lesion is 30% stenosed with 30% stenosed side branch in Acute Mrg.  --- Ost RPDA lesion is 75% stenosed. 2nd RPLB lesion is 70% stenosed.  LAD: Prox LAD stent (Xience 3.25 mm x 18 mm-2018) is 5% stenosed.  -- Prox LAD to Mid LAD lesion is 55% stenosed - after D1.  Ost Ramus lesion is 99% stenosed. Ost Ramus to Ramus stent (Xience 2.25 mm x 15 mm-2018) is 65% stenosed.  Mid Cx to Dist Cx lesion is 50% stenosed. Dist Cx lesion is 60% stenosed.  SUMMARY  Culprit lesion is likely the severe in-stent restenosis and near occlusion of the proximal diagonal-ramus intermedius that was previously treated with a DES stent --> not a viable option for re-PCI as I cannot find the ostium of this vessel.  Patent LAD and RCA stents.  Diffuse mild disease in the RCA with previously reported roughly 80% ostial PDA.  Diffuse mild to moderate disease in the mid LAD with roughly long segment of 50% after major diagonal branch.  Tandem moderate lesions in major OM branch.  Normal LVEF and normal EDP.  Films reviewed with  Dr. Burt Knack, we both felt that the small ramus intermedius branch is not viable for reattempted PCI. The other remaining lesions are moderate and we would be best served treated medically. Recommendation would be to continue to manage medically per primary team.  RECOMMENDATION  Return to nursing unit with ongoing care. TR band removal per protocol. Anticipate discharge tomorrow morning based on the late case.  Continue aggressive risk factor modification  Continue titrating antianginal medications -would consider Ranexa and or Imdur as he is already on good doses of carvedilol and amlodipine.   Echo 09/12/18 Study Conclusions  - Left  ventricle: The cavity size was normal. Systolic function was normal. The estimated ejection fraction was in the range of 55% to 60%. Wall motion was normal; there were no regional wall motion abnormalities. Doppler parameters are consistent with abnormal left ventricular relaxation (grade 1 diastolic dysfunction). - Pulmonary arteries: PA peak pressure: 32 mm Hg (S).  ASSESSMENT AND PLAN:  1. CAD: S/P cardiac cath with multivessel disease. He has normal LV fx. There were no targets for intervention. He is tolerating the isosorbide without side effects. He is medically compliant. Continue current medical management. Rx for NTG sublingual is refilled.   2. Hypercholesterolemia: Continue statin therapy. He will have repeat lipids and LFT's prior to follow up appointment with Dr. Percival Spanish in [place].   3. Hypertension: BP is well controlled.    Current medicines are reviewed at length with the patient today.    Labs/ tests ordered today include: BMET Lipids and LFT's just before follow up appt.   Phill Myron. West Pugh, ANP, AACC   09/25/2018 1:34 PM    [facility] [address] Office [phone] Fax [phone]

## 2018-09-25 ENCOUNTER — Ambulatory Visit (INDEPENDENT_AMBULATORY_CARE_PROVIDER_SITE_OTHER): Payer: Medicaid Other | Admitting: Adult Health

## 2018-09-25 ENCOUNTER — Encounter: Payer: Self-pay | Admitting: Adult Health

## 2018-09-25 VITALS — BP 112/68 | HR 63 | Ht 71.0 in | Wt 216.8 lb

## 2018-09-25 DIAGNOSIS — E785 Hyperlipidemia, unspecified: Secondary | ICD-10-CM | POA: Diagnosis not present

## 2018-09-25 DIAGNOSIS — Z79899 Other long term (current) drug therapy: Secondary | ICD-10-CM

## 2018-09-25 DIAGNOSIS — I2510 Atherosclerotic heart disease of native coronary artery without angina pectoris: Secondary | ICD-10-CM | POA: Diagnosis not present

## 2018-09-25 DIAGNOSIS — I10 Essential (primary) hypertension: Secondary | ICD-10-CM | POA: Diagnosis not present

## 2018-09-25 MED ORDER — NITROGLYCERIN 0.4 MG SL SUBL: 0.4000 mg | SUBLINGUAL_TABLET | SUBLINGUAL | 1 refills | Status: DC | PRN

## 2018-09-25 NOTE — Patient Instructions (Signed)
Medication Instructions:  NO CHANGES- Your physician recommends that you continue on your current medications as directed. Please refer to the Current Medication list given to you today. If you need a refill on your cardiac medications before your next appointment, please call your pharmacy.  Labwork: LFT,LIPID AND BMET 1 WEEK BEFORE FOLLOW UP APPOINTMENT OUR OFFICE AT [facility]   You will need to fast. DO NOT EAT OR DRINK PAST MIDNIGHT.     Take the provided lab slips with you to the lab for your blood draw.   When you have your labs (blood work) drawn today and your tests are completely normal, you will receive your results only by MyChart Message (if you have MyChart) -OR-  A paper copy in the mail.  If you have any lab test that is abnormal or we need to change your treatment, we will call you to review these results.  Follow-Up: You will need a follow up appointment in [place].   You may see Minus Breeding, MD or one of the following Advanced Practice Providers on your designated Care Team: Jory Sims, DNP, AACC     Rosaria Ferries, PA-C  At [facility], you and your health needs are our priority.  As part of our continuing mission to provide you with exceptional heart care, we have created designated Provider Care Teams.  These Care Teams include your primary Cardiologist (physician) and Advanced Practice Providers (APPs -  Physician Assistants and Nurse Practitioners) who all work together to provide you with the care you need, when you need it.  Thank you for choosing [facility] at [facility]!!

## 2018-11-27 ENCOUNTER — Telehealth: Payer: Self-pay | Admitting: *Deleted

## 2018-11-27 NOTE — Telephone Encounter (Signed)
Spoke with patient is is going to upload Webex for the appt with Dr Percival Spanish 4/1 at 11am.  He is agreeable to having his appt with Dr Percival Spanish via [place], aware his insurance will be billed, aware he will be called prior to the appt to review VS and medications.  YOUR CARDIOLOGY TEAM HAS ARRANGED FOR AN E-VISIT FOR YOUR APPOINTMENT - PLEASE REVIEW IMPORTANT INFORMATION BELOW SEVERAL DAYS PRIOR TO YOUR APPOINTMENT  Due to the recent COVID-19 pandemic, we are transitioning in-person office visits to tele-medicine visits in an effort to decrease unnecessary exposure to our patients and staff. Medicare and most insurances are covering these visits without a copay needed. You will need a smartphone if possible. We also encourage you to sign up for MyChart. For patients that do not have this, we can still complete the visit using a regular telephone but do prefer a smartphone to enable video when possible. You may have a close family member that can help. If possible, we also ask that you have a blood pressure cuff and scale at home to measure your blood pressure, heart rate and weight prior to your scheduled appointment. Patients with clinical needs that need an in-person evaluation and testing will still be able to come to the office if absolutely necessary. If you have any questions, feel free to call our office.    IF YOU HAVE A SMARTPHONE, PLEASE DOWNLOAD THE WEBEX APP TO YOUR SMARTPHONE  - If Apple, go to [employer] and type in WebEx in the search bar. [place] [employer], the blue/green circle. The app is free but as with any other app download, your phone may require you to verify saved payment information or Apple password. You do NOT have to create a WebEx account.  - If Android, go to [employer] and type in [employer] in the search bar. [place] [employer], the blue/green circle. The app is free but as with any other app download, your phone may require you to verify saved  payment information or Android password. You do NOT have to create a WebEx account.  It is very helpful to have this downloaded before your visit.    2-3 DAYS BEFORE YOUR APPOINTMENT  You will receive a telephone call from one of our [place] team members - your caller ID may say "Unknown caller." If this is a video visit, we will confirm that you have been able to download the WebEx app. We will remind you check your blood pressure, heart rate and weight prior to your scheduled appointment. If you have an Apple Watch or Kardia, please upload any pertinent ECG strips the day before or morning of your appointment to [place]. Our staff will also make sure you have reviewed the consent and agree to move forward with your scheduled tele-health visit.     THE DAY OF YOUR APPOINTMENT  Approximately 15 minutes prior to your scheduled appointment, you will receive a telephone call from one of [place] team - your caller ID may say "Unknown caller."  Our staff will confirm medications, vital signs for the day and any symptoms you may be experiencing. Please have this information available prior to the time of visit start. It may also be helpful for you to have a pad of paper and pen handy for any instructions given during your visit. They will also walk you through joining the WebEx smartphone meeting if this is a video visit.    CONSENT FOR TELE-HEALTH VISIT -  PLEASE RVIEW  I hereby voluntarily request, consent and authorize [facility] and its employed or contracted physicians, physician assistants, nurse practitioners or other licensed health care professionals (the Practitioner), to provide me with telemedicine health care services (the "Services") as deemed necessary by the treating Practitioner. I acknowledge and consent to receive the Services by the Practitioner via telemedicine. I understand that the telemedicine visit will involve communicating with the Practitioner through live audiovisual  communication technology and the disclosure of certain medical information by electronic transmission. I acknowledge that I have been given the opportunity to request an in-person assessment or other available alternative prior to the telemedicine visit and am voluntarily participating in the telemedicine visit.  I understand that I have the right to withhold or withdraw my consent to the use of telemedicine in the course of my care at any time, without affecting my right to future care or treatment, and that the Practitioner or I may terminate the telemedicine visit at any time. I understand that I have the right to inspect all information obtained and/or recorded in the course of the telemedicine visit and may receive copies of available information for a reasonable fee.  I understand that some of the potential risks of receiving the Services via telemedicine include:  Marland Kitchen Delay or interruption in medical evaluation due to technological equipment failure or disruption; . Information transmitted may not be sufficient (e.g. poor resolution of images) to allow for appropriate medical decision making by the Practitioner; and/or  . In rare instances, security protocols could fail, causing a breach of personal health information.  Furthermore, I acknowledge that it is my responsibility to provide information about my medical history, conditions and care that is complete and accurate to the best of my ability. I acknowledge that Practitioner's advice, recommendations, and/or decision may be based on factors not within their control, such as incomplete or inaccurate data provided by me or distortions of diagnostic images or specimens that may result from electronic transmissions. I understand that the practice of medicine is not an exact science and that Practitioner makes no warranties or guarantees regarding treatment outcomes. I acknowledge that I will receive a copy of this consent concurrently upon execution via  email to the email address I last provided but may also request a printed copy by calling the office of [place].    I understand that my insurance will be billed for this visit.   I have read or had this consent read to me. . I understand the contents of this consent, which adequately explains the benefits and risks of the Services being provided via telemedicine.  . I have been provided ample opportunity to ask questions regarding this consent and the Services and have had my questions answered to my satisfaction. . I give my informed consent for the services to be provided through the use of telemedicine in my medical care  By participating in this telemedicine visit I agree to the above.

## 2018-11-27 NOTE — Telephone Encounter (Signed)
Called Thursday and left message for pt to c/b to discuss his upcoming appt.  Called back today Monday 3/30 to discuss with pt if he is willing to do a phone visit on 4/1 at 11 AM or if he would prefer to reschedule.  Requested he c/b.

## 2018-11-27 NOTE — Telephone Encounter (Signed)
New Message  Patient states he has an Iphone so yes he can do the Evisit.

## 2018-11-28 ENCOUNTER — Telehealth: Payer: Self-pay | Admitting: Cardiology

## 2018-11-28 NOTE — Telephone Encounter (Signed)
New message    Patient returning your phone call.

## 2018-11-28 NOTE — Telephone Encounter (Signed)
°  Left message for patient to call back so that we can pre-screen/register for appointment with [place] November 29, 2018.  COVID-19 Pre-Screening Questions:   Do you currently have a fever?       Have you recently travelled on a cruise, internationally, or to [place], [place], [place], [place], [place], or [place], [place] [employer]) ?      Have you been in contact with someone that is currently pending confirmation of Covid19 testing or has been confirmed to have the [place] virus?      Are you currently experiencing fatigue or cough?

## 2018-11-29 ENCOUNTER — Telehealth (INDEPENDENT_AMBULATORY_CARE_PROVIDER_SITE_OTHER): Payer: Self-pay | Admitting: Cardiology

## 2018-11-29 ENCOUNTER — Encounter: Payer: Self-pay | Admitting: Cardiology

## 2018-11-29 VITALS — HR 58 | Ht 70.0 in | Wt 210.0 lb

## 2018-11-29 DIAGNOSIS — R002 Palpitations: Secondary | ICD-10-CM

## 2018-11-29 DIAGNOSIS — E785 Hyperlipidemia, unspecified: Secondary | ICD-10-CM

## 2018-11-29 DIAGNOSIS — I10 Essential (primary) hypertension: Secondary | ICD-10-CM

## 2018-11-29 DIAGNOSIS — R079 Chest pain, unspecified: Secondary | ICD-10-CM

## 2018-11-29 DIAGNOSIS — R0600 Dyspnea, unspecified: Secondary | ICD-10-CM

## 2018-11-29 DIAGNOSIS — R0602 Shortness of breath: Secondary | ICD-10-CM

## 2018-11-29 DIAGNOSIS — I2510 Atherosclerotic heart disease of native coronary artery without angina pectoris: Secondary | ICD-10-CM

## 2018-11-29 MED ORDER — CLOPIDOGREL BISULFATE 75 MG PO TABS: 75.0000 mg | ORAL_TABLET | Freq: Every day | ORAL | 3 refills | Status: DC

## 2018-11-29 MED ORDER — CARVEDILOL 12.5 MG PO TABS: 12.5000 mg | ORAL_TABLET | Freq: Two times a day (BID) | ORAL | 3 refills | Status: DC

## 2018-11-29 MED ORDER — ISOSORBIDE MONONITRATE ER 60 MG PO TB24: 60.0000 mg | ORAL_TABLET | Freq: Every day | ORAL | 3 refills | Status: DC

## 2018-11-29 MED ORDER — AMLODIPINE BESYLATE 10 MG PO TABS: 10.0000 mg | ORAL_TABLET | Freq: Every day | ORAL | 3 refills | Status: DC

## 2018-11-29 MED ORDER — ATORVASTATIN CALCIUM 80 MG PO TABS: 80.0000 mg | ORAL_TABLET | Freq: Every day | ORAL | 3 refills | Status: DC

## 2018-11-29 NOTE — Patient Instructions (Signed)
Medication Instructions:  Please increase your Isosorbide to 60 mg daily. Continue all other medications as listed. If you need a refill on your cardiac medications before your next appointment, please call your pharmacy.   Testing/Procedures: Your physician has recommended that you wear a holter monitor for 2 weeks. Holter monitors are medical devices that record the heart's electrical activity. Doctors most often use these monitors to diagnose arrhythmias. Arrhythmias are problems with the speed or rhythm of the heartbeat. The monitor is a small, portable device. You can wear one while you do your normal daily activities. This is usually used to diagnose what is causing palpitations/syncope (passing out).  Follow-Up: Follow up with Dr Percival Spanish in 2 months in the [place] office.  Thank you for choosing [facility]!!

## 2018-11-29 NOTE — Progress Notes (Signed)
Virtual Visit via Video Note    Evaluation Performed:  Follow-up visit  This visit type was conducted due to national recommendations for restrictions regarding the COVID-19 Pandemic (e.g. social distancing).  This format is felt to be most appropriate for this patient at this time.  All issues noted in this document were discussed and addressed.  No physical exam was performed (except for noted visual exam findings with Video Visits).  Please refer to the patient's chart (MyChart message for video visits and phone note for telephone visits) for the patient's consent to telehealth for [facility].  Date:  11/29/2018   ID:  William Mcmahon, DOB 30-Jul-1961, MRN [id number]  Patient Location:  Home address  Provider location:   [place], [place]  PCP:  System, Pcp Not In  Cardiologist:  Minus Breeding, MD  Electrophysiologist:  None   Chief Complaint:  Chest pain  History of Present Illness:    William Mcmahon is a 58 y.o. male who presents via audio/video conferencing for a telehealth visit today.    The patient presents for follow-up of known coronary disease.   He has a history of CAD as listed below.  He has been doing OK although he does have some chest discomfort.  This is sporadic.  It is mild and it seems to come on randomly.  He has only had to take a few SLNTG in the last several weeks.   He does however, get SOB with going out in the warm weather.  This was not like his previous angina.  He is not describing PND or orthopnea.  He has not had weight gain or edema.  He also describes palpitations.  These are happening more at night.  It seems to wake him up.  He has having increased fatigue as he does not sleep through the night.  He has had no presyncope or syncope.   The patient does not have symptoms concerning for COVID-19 infection (fever, chills, cough, or new shortness of breath).    Prior CV studies:   The following studies were reviewed today:  Cath as below.   Past  Medical History:  Diagnosis Date  . CAD in native artery, with prior stents, and instent restenosis of small vessel, medical therapy, LAD and RCA stents are patent 09/12/2018  . Cancer ([place])    thyroid  . Coronary artery disease    hx of stents  . DM (diabetes mellitus), type 2 ([place]) diet controlled 09/12/2018  . HLD (hyperlipidemia) 09/12/2018  . Hyperlipidemia   . Hypertension   . Syncope- may be due to diuretics 09/12/2018  . Thyroid disease    Past Surgical History:  Procedure Laterality Date  . CARDIAC SURGERY     stents  . CHOLECYSTECTOMY    . LEFT HEART CATH AND CORONARY ANGIOGRAPHY N/A 09/11/2018   Procedure: LEFT HEART CATH AND CORONARY ANGIOGRAPHY;  Surgeon: Leonie Man, MD;  Location: [place] [facility];  Service: Cardiovascular;  Laterality: N/A;     Current Meds  Medication Sig  . acetaminophen (TYLENOL) 325 MG tablet Take 2 tablets (650 mg total) by mouth every 4 (four) hours as needed for headache or mild pain.  Marland Kitchen amLODipine (NORVASC) 10 MG tablet Take 1 tablet (10 mg total) by mouth daily.  Marland Kitchen aspirin EC 81 MG EC tablet Take 1 tablet (81 mg total) by mouth daily.  Marland Kitchen atorvastatin (LIPITOR) 80 MG tablet Take 1 tablet (80 mg total) by mouth daily.  . carvedilol (COREG) 12.5  MG tablet Take 1 tablet (12.5 mg total) by mouth 2 (two) times daily with a meal.  . clopidogrel (PLAVIX) 75 MG tablet Take 1 tablet (75 mg total) by mouth daily.  . isosorbide mononitrate (IMDUR) 60 MG 24 hr tablet Take 1 tablet (60 mg total) by mouth daily.  Marland Kitchen levothyroxine (SYNTHROID, LEVOTHROID) 125 MCG tablet Take 125 mcg by mouth daily before breakfast.  . nitroGLYCERIN (NITROSTAT) 0.4 MG SL tablet Place 1 tablet (0.4 mg total) under the tongue every 5 (five) minutes as needed for chest pain.  . [DISCONTINUED] amLODipine (NORVASC) 10 MG tablet Take 10 mg by mouth daily.  . [DISCONTINUED] atorvastatin (LIPITOR) 80 MG tablet Take 80 mg by mouth daily.  . [DISCONTINUED] carvedilol (COREG) 12.5 MG  tablet Take 12.5 mg by mouth 2 (two) times daily with a meal.  . [DISCONTINUED] clopidogrel (PLAVIX) 75 MG tablet Take 75 mg by mouth daily.  . [DISCONTINUED] isosorbide mononitrate (IMDUR) 30 MG 24 hr tablet Take 1 tablet (30 mg total) by mouth daily.     Allergies:   Patient has no known allergies.   Social History   Tobacco Use  . Smoking status: Former Research scientist (life sciences)  . Smokeless tobacco: Never Used  Substance Use Topics  . Alcohol use: Never    Frequency: Never  . Drug use: Never     Family Hx: The patient's family history is not on file.  ROS:   Please see the history of present illness.    As stated in the HPI and negative for all other systems.   Labs/Other Tests and Data Reviewed:    Recent Labs: 09/10/2018: Magnesium 1.9; TSH 0.958 09/12/2018: BUN 9; Creatinine, Ser 0.94; Hemoglobin 14.5; Platelets 241; Potassium 3.6; Sodium 142   Recent Lipid Panel Lab Results  Component Value Date/Time   CHOL 113 09/11/2018 01:16 AM   TRIG 73 09/11/2018 01:16 AM   HDL 25 (L) 09/11/2018 01:16 AM   CHOLHDL 4.5 09/11/2018 01:16 AM   LDLCALC 73 09/11/2018 01:16 AM    Wt Readings from Last 3 Encounters:  11/29/18 210 lb (95.3 kg)  09/25/18 216 lb 12.8 oz (98.3 kg)  09/10/18 212 lb 4.9 oz (96.3 kg)    Cardiac cath 09/28/18        Objective:    Vital Signs:  Pulse (!) 58   Ht 5\' 10"  (1.778 m)   Wt 210 lb (95.3 kg)   BMI 30.13 kg/m    Well nourished, well developed male in no acute distress.   ASSESSMENT & PLAN:    CAD:    Today I was able to review with him via video his cath and this was reassuring to him.  He does have some chest pain.  I plan to increase his Imdur to 60 mg po daily.  He will let me know if his chest pain or dyspnea increases.    DYSPNEA:  I suspect that this is not an anginal equivalent.  He has a well preserved EF.  I will be increasing his meds as above.   DYSLIPIDEMIA:   He will continue his meds as listed.   HYPERTENSION:     He is going to  get a BP cuff.    PALPITATIONS:  I am going to send him a two week ZIO patch.    COVID-19 Education: The signs and symptoms of COVID-19 were discussed with the patient and how to seek care for testing (follow up with PCP or arrange E-visit).  The importance of  social distancing was discussed today.  Patient Risk:   After full review of this patient's clinical status, I feel that they are at least moderate risk at this time.  Time:   Today, I have spent 25 minutes with the patient with telehealth technology discussing the above.     Medication Adjustments/Labs and Tests Ordered: Current medicines are reviewed at length with the patient today.  Concerns regarding medicines are outlined above.  Tests Ordered: Orders Placed This Encounter  Procedures  . LONG TERM MONITOR (3-14 DAYS)   Medication Changes: Meds ordered this encounter  Medications  . amLODipine (NORVASC) 10 MG tablet    Sig: Take 1 tablet (10 mg total) by mouth daily.    Dispense:  90 tablet    Refill:  3  . atorvastatin (LIPITOR) 80 MG tablet    Sig: Take 1 tablet (80 mg total) by mouth daily.    Dispense:  90 tablet    Refill:  3  . carvedilol (COREG) 12.5 MG tablet    Sig: Take 1 tablet (12.5 mg total) by mouth 2 (two) times daily with a meal.    Dispense:  180 tablet    Refill:  3  . clopidogrel (PLAVIX) 75 MG tablet    Sig: Take 1 tablet (75 mg total) by mouth daily.    Dispense:  90 tablet    Refill:  3  . isosorbide mononitrate (IMDUR) 60 MG 24 hr tablet    Sig: Take 1 tablet (60 mg total) by mouth daily.    Dispense:  90 tablet    Refill:  3    Disposition:  Follow up 3 months.   Signed, Minus Breeding, MD  11/29/2018 1:24 PM    [facility]

## 2018-11-30 ENCOUNTER — Telehealth: Payer: Self-pay | Admitting: *Deleted

## 2018-11-30 NOTE — Telephone Encounter (Signed)
Explained to patient we will have the [place] his Long term holter monitor directly to his home.  Follow the instuctions in kit to apply monitor and do not shower for 24 hours after applying monitor.  At the end of 14 days, remove monitor and stick it to the last page of his log book.  Put log book back in prepaid postage boc and mail back to [employer].  We should have results approximately 5 days after it is mailed back.

## 2018-12-04 ENCOUNTER — Ambulatory Visit (INDEPENDENT_AMBULATORY_CARE_PROVIDER_SITE_OTHER): Payer: Self-pay

## 2018-12-04 DIAGNOSIS — R002 Palpitations: Secondary | ICD-10-CM

## 2018-12-04 DIAGNOSIS — I2510 Atherosclerotic heart disease of native coronary artery without angina pectoris: Secondary | ICD-10-CM

## 2018-12-20 ENCOUNTER — Other Ambulatory Visit: Payer: Self-pay

## 2019-01-15 DIAGNOSIS — E7800 Pure hypercholesterolemia, unspecified: Secondary | ICD-10-CM | POA: Insufficient documentation

## 2019-02-05 ENCOUNTER — Telehealth: Payer: Self-pay | Admitting: *Deleted

## 2019-02-05 NOTE — Telephone Encounter (Signed)
Pt. evisit conscent in chart from last visit. I spoke with Mr. Slabach and confirmed appointment for Wednesday, June 10. Also, reminded him to have his BP, weight and meds ready. He is agreeable to the virtual visit.

## 2019-02-05 NOTE — Progress Notes (Signed)
Virtual Visit via Telephone Note   This visit type was conducted due to national recommendations for restrictions regarding the COVID-19 Pandemic (e.g. social distancing) in an effort to limit this patient's exposure and mitigate transmission in our community.  Due to his co-morbid illnesses, this patient is at least at moderate risk for complications without adequate follow up.  This format is felt to be most appropriate for this patient at this time.  All issues noted in this document were discussed and addressed.  A limited physical exam was performed with this format.  Please refer to the patient's chart for his consent to telehealth for [facility].   Date:  02/07/2019   ID:  William Mcmahon, DOB December 13, 1960, MRN [id number]  Patient Location: Home Provider Location: Home  PCP:  Chesley Noon, MD  Cardiologist:  Minus Breeding, MD  Electrophysiologist:  None   Evaluation Performed:  Follow-Up Visit  Chief Complaint:  Palpitations   History of Present Illness:    William Mcmahon is a 58 y.o. male who presents for evaluation of coronary disease.   He has a history of CAD as listed below.  He had palpitations.  He had PACs on ZIO patch.   We did attempt a video visit but the connection did not work.   He was having some chest pain and he had Imdur was increased at the last visit.  However, he currently is doing well. He walks every morning with his 27 year old granddaughter.  He has no problems related to this.  He says he gets more affected by the heat than he used to but he just goes laterally and comes in when he is getting warm.  He has not had any chest pressure, presyncope or syncope.  He said no weight gain or edema.  He has had no new shortness of breath, PND or orthopnea.  He is not had to use nitroglycerin.  The patient does not have symptoms concerning for COVID-19 infection (fever, chills, cough, or new shortness of breath).    Past Medical History:  Diagnosis Date   . CAD in native artery, with prior stents, and instent restenosis of small vessel, medical therapy, LAD and RCA stents are patent 09/12/2018  . Cancer ([place])    thyroid  . Coronary artery disease    hx of stents  . DM (diabetes mellitus), type 2 ([place]) diet controlled 09/12/2018  . HLD (hyperlipidemia) 09/12/2018  . Hyperlipidemia   . Hypertension   . Syncope- may be due to diuretics 09/12/2018  . Thyroid disease    Past Surgical History:  Procedure Laterality Date  . CARDIAC SURGERY     stents  . CHOLECYSTECTOMY    . LEFT HEART CATH AND CORONARY ANGIOGRAPHY N/A 09/11/2018   Procedure: LEFT HEART CATH AND CORONARY ANGIOGRAPHY;  Surgeon: Leonie Man, MD;  Location: [place] [facility];  Service: Cardiovascular;  Laterality: N/A;     Prior to Admission medications   Medication Sig Start Date End Date Taking? Authorizing Provider  acetaminophen (TYLENOL) 325 MG tablet Take 2 tablets (650 mg total) by mouth every 4 (four) hours as needed for headache or mild pain. 09/12/18  Yes Isaiah Serge, NP  amLODipine (NORVASC) 10 MG tablet Take 1 tablet (10 mg total) by mouth daily. 11/29/18  Yes Minus Breeding, MD  aspirin EC 81 MG EC tablet Take 1 tablet (81 mg total) by mouth daily. 09/13/18  Yes Isaiah Serge, NP  atorvastatin (LIPITOR) 80 MG tablet Take  1 tablet (80 mg total) by mouth daily. 11/29/18  Yes Minus Breeding, MD  carvedilol (COREG) 12.5 MG tablet Take 1 tablet (12.5 mg total) by mouth 2 (two) times daily with a meal. 11/29/18  Yes Minus Breeding, MD  clopidogrel (PLAVIX) 75 MG tablet Take 1 tablet (75 mg total) by mouth daily. 11/29/18  Yes Minus Breeding, MD  isosorbide mononitrate (IMDUR) 60 MG 24 hr tablet Take 1 tablet (60 mg total) by mouth daily. 11/29/18  Yes Minus Breeding, MD  levothyroxine (SYNTHROID, LEVOTHROID) 125 MCG tablet Take 125 mcg by mouth daily before breakfast.   Yes [provider]  nitroGLYCERIN (NITROSTAT) 0.4 MG SL tablet Place 1 tablet (0.4 mg total)  under the tongue every 5 (five) minutes as needed for chest pain. 09/25/18  Yes Lendon Colonel, NP     Allergies:   Patient has no known allergies.   Social History   Tobacco Use  . Smoking status: Former Research scientist (life sciences)  . Smokeless tobacco: Never Used  Substance Use Topics  . Alcohol use: Never    Frequency: Never  . Drug use: Never     Family Hx: The patient's family history is not on file.  ROS:   Please see the history of present illness.    As stated in the HPI and negative for all other systems.   Prior CV studies:   The following studies were reviewed today:  CARDIAC CATH:  09/11/18       Labs/Other Tests and Data Reviewed:    EKG:  No ECG reviewed.  Recent Labs: 09/10/2018: Magnesium 1.9; TSH 0.958 09/12/2018: BUN 9; Creatinine, Ser 0.94; Hemoglobin 14.5; Platelets 241; Potassium 3.6; Sodium 142   Recent Lipid Panel Lab Results  Component Value Date/Time   CHOL 113 09/11/2018 01:16 AM   TRIG 73 09/11/2018 01:16 AM   HDL 25 (L) 09/11/2018 01:16 AM   CHOLHDL 4.5 09/11/2018 01:16 AM   LDLCALC 73 09/11/2018 01:16 AM    Wt Readings from Last 3 Encounters:  02/07/19 202 lb (91.6 kg)  11/29/18 210 lb (95.3 kg)  09/25/18 216 lb 12.8 oz (98.3 kg)     Objective:    Vital Signs:  BP 115/63   Pulse (!) 59   Ht 5\' 11"  (1.803 m)   Wt 202 lb (91.6 kg)   BMI 28.17 kg/m    VITAL SIGNS:  reviewed  ASSESSMENT & PLAN:    CAD:    The patient has no new sypmtoms.  No further cardiovascular testing is indicated.  We will continue with aggressive risk reduction and meds as listed.  DYSLIPIDEMIA:   LDL was 73 with an HDL of 25.  No change in therapy.  HYPERTENSION:     Blood pressures well controlled.  He brought himself blood pressure cuff.  No change in therapy.  PALPITATIONS:   He had rare atrial ectopy on ZIO patch.   He has not had no new significant symptoms.  No change in therapy.   COVID-19 Education: The signs and symptoms of COVID-19 were discussed  with the patient and how to seek care for testing (follow up with PCP or arrange E-visit).  The importance of social distancing was discussed today.  Time:   Today, I have spent 16 minutes with the patient with telehealth technology discussing the above problems.     Medication Adjustments/Labs and Tests Ordered: Current medicines are reviewed at length with the patient today.  Concerns regarding medicines are outlined above.   Tests Ordered:  No orders of the defined types were placed in this encounter.   Medication Changes: No orders of the defined types were placed in this encounter.   Disposition:  Follow up in the office with me in Jan of next year.    Signed, Minus Breeding, MD  02/07/2019 8:56 AM    [facility]

## 2019-02-07 ENCOUNTER — Other Ambulatory Visit: Payer: Self-pay

## 2019-02-07 ENCOUNTER — Encounter: Payer: Self-pay | Admitting: Cardiology

## 2019-02-07 ENCOUNTER — Telehealth (INDEPENDENT_AMBULATORY_CARE_PROVIDER_SITE_OTHER): Payer: Self-pay | Admitting: Cardiology

## 2019-02-07 VITALS — BP 115/63 | HR 59 | Ht 71.0 in | Wt 202.0 lb

## 2019-02-07 DIAGNOSIS — R002 Palpitations: Secondary | ICD-10-CM

## 2019-02-07 DIAGNOSIS — Z7189 Other specified counseling: Secondary | ICD-10-CM

## 2019-02-07 DIAGNOSIS — E785 Hyperlipidemia, unspecified: Secondary | ICD-10-CM

## 2019-02-07 DIAGNOSIS — I2510 Atherosclerotic heart disease of native coronary artery without angina pectoris: Secondary | ICD-10-CM

## 2019-02-07 NOTE — Patient Instructions (Signed)
Medication Instructions:  The current medical regimen is effective;  continue present plan and medications.  If you need a refill on your cardiac medications before your next appointment, please call your pharmacy.   Follow-Up: Follow up in 6 months with Dr. Percival Spanish in the [place] office. (January 2021) You will receive a letter in the mail 2 months before you are due.  Please call us when you receive this letter to schedule your follow up appointment.  Thank you for choosing [facility]!!

## 2019-03-05 ENCOUNTER — Encounter (HOSPITAL_COMMUNITY): Payer: Self-pay | Admitting: Emergency Medicine

## 2019-03-05 ENCOUNTER — Other Ambulatory Visit: Payer: Self-pay

## 2019-03-05 ENCOUNTER — Emergency Department (HOSPITAL_COMMUNITY)
Admission: EM | Admit: 2019-03-05 | Discharge: 2019-03-05 | Discharge status: Against Medical Advice | Payer: Medicaid Other | Attending: Emergency Medicine | Admitting: Emergency Medicine

## 2019-03-05 DIAGNOSIS — Z5321 Procedure and treatment not carried out due to patient leaving prior to being seen by health care provider: Secondary | ICD-10-CM | POA: Insufficient documentation

## 2019-03-05 NOTE — ED Notes (Signed)
Called no answer

## 2019-03-05 NOTE — ED Triage Notes (Signed)
Pt c/o pain to left side of body from fall today. Pt states he fell down camper steps. Pt states it hurts to take a deep breath.

## 2019-09-11 ENCOUNTER — Telehealth: Payer: Self-pay | Admitting: *Deleted

## 2019-09-11 NOTE — Telephone Encounter (Signed)
A message was left, re: his follow up visit. 

## 2019-10-02 NOTE — Progress Notes (Signed)
Cardiology Office Note   Date:  10/03/2019   ID:  William Mcmahon, DOB Jul 09, 1961, MRN [id number]  PCP:  Chesley Noon, MD  Cardiologist:   Minus Breeding, MD   Chief Complaint  Patient presents with  . Coronary Artery Disease      History of Present Illness: William Mcmahon is a 59 y.o. male who presents for evaluation of coronary disease.   He has a history of CAD as listed below.  He had palpitations.  He had PACs on ZIO patch.   Since I last saw him he has done well.  He fell and broke some ribs.  This caused him to stop walking and gained some weight.  However, he is otherwise done well.  He has had none of the discomfort that he had at the time of his stents.  He has not had any new palpitations, presyncope or syncope.  Has had no weight gain or edema.   Past Medical History:  Diagnosis Date  . CAD in native artery, with prior stents, and instent restenosis of small vessel, medical therapy, LAD and RCA stents are patent 09/12/2018  . Cancer ([place])    thyroid  . Coronary artery disease    hx of stents  . DM (diabetes mellitus), type 2 ([place]) diet controlled 09/12/2018  . HLD (hyperlipidemia) 09/12/2018  . Hyperlipidemia   . Hypertension   . Syncope- may be due to diuretics 09/12/2018  . Thyroid disease     Past Surgical History:  Procedure Laterality Date  . CARDIAC SURGERY     stents  . CHOLECYSTECTOMY    . LEFT HEART CATH AND CORONARY ANGIOGRAPHY N/A 09/11/2018   Procedure: LEFT HEART CATH AND CORONARY ANGIOGRAPHY;  Surgeon: Leonie Man, MD;  Location: [facility];  Service: Cardiovascular;  Laterality: N/A;     Current Outpatient Medications  Medication Sig Dispense Refill  . amLODipine (NORVASC) 10 MG tablet Take 1 tablet (10 mg total) by mouth daily. 90 tablet 3  . aspirin EC 81 MG EC tablet Take 1 tablet (81 mg total) by mouth daily.    Marland Kitchen atorvastatin (LIPITOR) 80 MG tablet Take 1 tablet (80 mg total) by mouth daily. 90 tablet 3  . carvedilol  (COREG) 12.5 MG tablet Take 1 tablet (12.5 mg total) by mouth 2 (two) times daily with a meal. 180 tablet 3  . celecoxib (CELEBREX) 50 MG capsule Take 50 mg by mouth 2 (two) times daily.    . clopidogrel (PLAVIX) 75 MG tablet Take 1 tablet (75 mg total) by mouth daily. 90 tablet 3  . isosorbide mononitrate (IMDUR) 60 MG 24 hr tablet Take 1 tablet (60 mg total) by mouth daily. 90 tablet 3  . levothyroxine (SYNTHROID, LEVOTHROID) 125 MCG tablet Take 125 mcg by mouth daily before breakfast.    . nitroGLYCERIN (NITROSTAT) 0.4 MG SL tablet Place 1 tablet (0.4 mg total) under the tongue every 5 (five) minutes as needed for chest pain. 25 tablet 1   No current facility-administered medications for this visit.    Allergies:   Naproxen    ROS:  Please see the history of present illness.   Otherwise, review of systems are positive for none.   All other systems are reviewed and negative.    PHYSICAL EXAM: VS:  BP 122/80   Pulse (!) 56   Ht 5\' 10"  (1.778 m)   Wt 232 lb (105.2 kg)   BMI 33.29 kg/m  , BMI Body mass  index is 33.29 kg/m. GENERAL:  Well appearing NECK:  No jugular venous distention, waveform within normal limits, carotid upstroke brisk and symmetric, no bruits, no thyromegaly LUNGS:  Clear to auscultation bilaterally CHEST:  Unremarkable HEART:  PMI not displaced or sustained,S1 and S2 within normal limits, no S3, no S4, no clicks, no rubs, no murmurs ABD:  Flat, positive bowel sounds normal in frequency in pitch, no bruits, no rebound, no guarding, no midline pulsatile mass, no hepatomegaly, no splenomegaly EXT:  2 plus pulses throughout, no edema, no cyanosis no clubbing]    EKG:  EKG is ordered today. The ekg ordered today demonstrates sinus rhythm, rate 56, axis within normal limits, intervals within normal limits, no acute ST-T wave changes.   Recent Labs: No results found for requested labs within last 8760 hours.    Lipid Panel    Component Value Date/Time   CHOL  113 09/11/2018 0116   TRIG 73 09/11/2018 0116   HDL 25 (L) 09/11/2018 0116   CHOLHDL 4.5 09/11/2018 0116   VLDL 15 09/11/2018 0116   LDLCALC 73 09/11/2018 0116      Wt Readings from Last 3 Encounters:  10/03/19 232 lb (105.2 kg)  02/07/19 202 lb (91.6 kg)  11/29/18 210 lb (95.3 kg)      Other studies Reviewed: Additional studies/ records that were reviewed today include: None. Review of the above records demonstrates:  Please see elsewhere in the note.     ASSESSMENT AND PLAN:  CAD: The patient has no new sypmtoms.  No further cardiovascular testing is indicated.  We will continue with aggressive risk reduction and meds as listed.  DYSLIPIDEMIA: LDL was drawn recently by his primary provider and he is going to have the sent to me.  The goal is an LDL less than 70.   HYPERTENSION:  Controlled and he will continue the meds as listed.   PALPITATIONS:    He is not having any significant symptoms related to this.  No change in therapy.py.  COVID EDUCATION: He will get the vaccine when he is able.  Current medicines are reviewed at length with the patient today.  The patient does not have concerns regarding medicines.  The following changes have been made:  no change  Labs/ tests ordered today include: None  Orders Placed This Encounter  Procedures  . EKG 12-Lead     Disposition:   FU with me in one year.     Signed, Minus Breeding, MD  10/03/2019 4:53 PM    [facility]

## 2019-10-03 ENCOUNTER — Other Ambulatory Visit: Payer: Self-pay

## 2019-10-03 ENCOUNTER — Ambulatory Visit (INDEPENDENT_AMBULATORY_CARE_PROVIDER_SITE_OTHER): Payer: Medicaid Other | Admitting: Cardiology

## 2019-10-03 ENCOUNTER — Encounter: Payer: Self-pay | Admitting: Cardiology

## 2019-10-03 VITALS — BP 122/80 | HR 56 | Ht 70.0 in | Wt 232.0 lb

## 2019-10-03 DIAGNOSIS — Z7189 Other specified counseling: Secondary | ICD-10-CM

## 2019-10-03 DIAGNOSIS — E785 Hyperlipidemia, unspecified: Secondary | ICD-10-CM

## 2019-10-03 DIAGNOSIS — R002 Palpitations: Secondary | ICD-10-CM

## 2019-10-03 DIAGNOSIS — I2510 Atherosclerotic heart disease of native coronary artery without angina pectoris: Secondary | ICD-10-CM

## 2019-10-03 DIAGNOSIS — I10 Essential (primary) hypertension: Secondary | ICD-10-CM

## 2019-10-03 NOTE — Patient Instructions (Signed)
Medication Instructions:  The current medical regimen is effective;  continue present plan and medications.  *If you need a refill on your cardiac medications before your next appointment, please call your pharmacy*  Follow-Up: At [facility], you and your health needs are our priority.  As part of our continuing mission to provide you with exceptional heart care, we have created designated Provider Care Teams.  These Care Teams include your primary Cardiologist (physician) and Advanced Practice Providers (APPs -  Physician Assistants and Nurse Practitioners) who all work together to provide you with the care you need, when you need it.  Your next appointment:   12 month(s)  The format for your next appointment:   In Person  Provider:   James Hochrein, MD  Thank you for choosing [facility]!!     

## 2019-12-14 ENCOUNTER — Other Ambulatory Visit: Payer: Self-pay | Admitting: Cardiology

## 2020-02-02 ENCOUNTER — Other Ambulatory Visit: Payer: Self-pay | Admitting: Cardiology

## 2020-02-08 ENCOUNTER — Telehealth: Payer: Self-pay | Admitting: Cardiology

## 2020-02-08 NOTE — Telephone Encounter (Signed)
No he does not need SBE  Steffani Dionisio Martinique MD, [facility]

## 2020-02-08 NOTE — Telephone Encounter (Signed)
1. What dental office are you calling from?  Dr [facility]   2. What is your office phone number? [phone]   3. What is your fax number? 4.  5. What type of procedure is the patient having performed?teeth   6. What date is procedure scheduled or is the patient there now?  (if the patient is at the dentist's office question goes to their cardiologist if he/she is in the office.  If not, question should go to the DOD).   7. What is your question (ex. Antibiotics prior to procedure, holding medication-we need to know how long dentist wants pt to hold med)? -

## 2020-02-08 NOTE — Telephone Encounter (Signed)
Advised Anderson Malta no need to hold Plavix or SBE for patient

## 2020-02-21 ENCOUNTER — Other Ambulatory Visit: Payer: Self-pay | Admitting: Cardiology

## 2020-02-21 MED ORDER — ATORVASTATIN CALCIUM 80 MG PO TABS: 80.0000 mg | ORAL_TABLET | Freq: Every day | ORAL | 0 refills | Status: DC

## 2020-02-21 MED ORDER — LEVOTHYROXINE SODIUM 125 MCG PO TABS: 125.0000 ug | ORAL_TABLET | Freq: Every day | ORAL | 0 refills | Status: DC

## 2020-02-21 NOTE — Telephone Encounter (Signed)
*  STAT* If patient is at the pharmacy, call can be transferred to refill team.   1. Which medications need to be refilled? (please list name of each medication and dose if known) atorvastatin (LIPITOR) 80 MG tablet / levothyroxine (SYNTHROID, LEVOTHROID) 125 MCG tablet  2. Which pharmacy/location (including street and city if local pharmacy) is medication to be sent to? [address] [place], [place], [place] [address]  3. Do they need a 30 day or 90 day supply? 90   Patient is on the way to the pharmacy.

## 2020-03-04 ENCOUNTER — Other Ambulatory Visit: Payer: Self-pay | Admitting: Cardiology

## 2020-03-04 MED ORDER — CARVEDILOL 12.5 MG PO TABS: 12.5000 mg | ORAL_TABLET | Freq: Two times a day (BID) | ORAL | 3 refills | Status: DC

## 2020-03-04 NOTE — Telephone Encounter (Signed)
*  STAT* If patient is at the pharmacy, call can be transferred to refill team.   1. Which medications need to be refilled? (please list name of each medication and dose if known)  carvedilol (COREG) 12.5 MG tablet  2. Which pharmacy/location (including street and city if local pharmacy) is medication to be sent to? [place] [place] Phone [phone]  3. Do they need a 30 day or 90 day supply? 90 day supply

## 2020-03-12 ENCOUNTER — Other Ambulatory Visit: Payer: Self-pay | Admitting: Cardiology

## 2020-03-31 DIAGNOSIS — Z85850 Personal history of malignant neoplasm of thyroid: Secondary | ICD-10-CM | POA: Insufficient documentation

## 2020-05-26 ENCOUNTER — Telehealth: Payer: Self-pay | Admitting: Cardiology

## 2020-05-26 NOTE — Telephone Encounter (Signed)
    *  STAT* If patient is at the pharmacy, call can be transferred to refill team.   1. Which medications need to be refilled? (please list name of each medication and dose if known) atorvastatin (LIPITOR) 80 MG tablet  2. Which pharmacy/location (including street and city if local pharmacy) is medication to be sent to? [place], [address]  3. Do they need a 30 day or 90 day supply? 90 days

## 2020-06-01 ENCOUNTER — Other Ambulatory Visit: Payer: Self-pay | Admitting: Cardiology

## 2020-09-21 ENCOUNTER — Emergency Department (HOSPITAL_COMMUNITY): Payer: Medicare Other

## 2020-09-21 ENCOUNTER — Observation Stay (HOSPITAL_COMMUNITY)
Admission: EM | Admit: 2020-09-21 | Discharge: 2020-09-22 | Disposition: A | Discharge status: Home / Self Care | Payer: Medicare Other | Attending: Cardiology | Admitting: Cardiology

## 2020-09-21 ENCOUNTER — Other Ambulatory Visit: Payer: Self-pay

## 2020-09-21 ENCOUNTER — Encounter (HOSPITAL_COMMUNITY): Payer: Self-pay | Admitting: Student

## 2020-09-21 DIAGNOSIS — I248 Other forms of acute ischemic heart disease: Secondary | ICD-10-CM

## 2020-09-21 DIAGNOSIS — E039 Hypothyroidism, unspecified: Secondary | ICD-10-CM | POA: Insufficient documentation

## 2020-09-21 DIAGNOSIS — Z87891 Personal history of nicotine dependence: Secondary | ICD-10-CM | POA: Insufficient documentation

## 2020-09-21 DIAGNOSIS — I10 Essential (primary) hypertension: Secondary | ICD-10-CM | POA: Diagnosis present

## 2020-09-21 DIAGNOSIS — I4891 Unspecified atrial fibrillation: Principal | ICD-10-CM | POA: Diagnosis present

## 2020-09-21 DIAGNOSIS — R0789 Other chest pain: Secondary | ICD-10-CM | POA: Diagnosis present

## 2020-09-21 DIAGNOSIS — C73 Malignant neoplasm of thyroid gland: Secondary | ICD-10-CM | POA: Diagnosis not present

## 2020-09-21 DIAGNOSIS — Z79899 Other long term (current) drug therapy: Secondary | ICD-10-CM | POA: Diagnosis not present

## 2020-09-21 DIAGNOSIS — I119 Hypertensive heart disease without heart failure: Secondary | ICD-10-CM | POA: Diagnosis not present

## 2020-09-21 DIAGNOSIS — E785 Hyperlipidemia, unspecified: Secondary | ICD-10-CM | POA: Diagnosis not present

## 2020-09-21 DIAGNOSIS — I2510 Atherosclerotic heart disease of native coronary artery without angina pectoris: Secondary | ICD-10-CM | POA: Diagnosis present

## 2020-09-21 DIAGNOSIS — Z20822 Contact with and (suspected) exposure to covid-19: Secondary | ICD-10-CM | POA: Diagnosis not present

## 2020-09-21 DIAGNOSIS — E119 Type 2 diabetes mellitus without complications: Secondary | ICD-10-CM

## 2020-09-21 DIAGNOSIS — I2489 Other forms of acute ischemic heart disease: Secondary | ICD-10-CM

## 2020-09-21 DIAGNOSIS — R079 Chest pain, unspecified: Secondary | ICD-10-CM

## 2020-09-21 LAB — CBC
HCT: 46.4 % (ref 39.0–52.0)
Hemoglobin: 16.5 g/dL (ref 13.0–17.0)
MCH: 31.7 pg (ref 26.0–34.0)
MCHC: 35.6 g/dL (ref 30.0–36.0)
MCV: 89.2 fL (ref 80.0–100.0)
Platelets: 244 10*3/uL (ref 150–400)
RBC: 5.2 MIL/uL (ref 4.22–5.81)
RDW: 12.3 % (ref 11.5–15.5)
WBC: 7.6 10*3/uL (ref 4.0–10.5)
nRBC: 0 % (ref 0.0–0.2)

## 2020-09-21 LAB — BASIC METABOLIC PANEL
Anion gap: 14 (ref 5–15)
BUN: 8 mg/dL (ref 6–20)
CO2: 25 mmol/L (ref 22–32)
Calcium: 9.3 mg/dL (ref 8.9–10.3)
Chloride: 103 mmol/L (ref 98–111)
Creatinine, Ser: 0.93 mg/dL (ref 0.61–1.24)
GFR, Estimated: >60 mL/min (ref >60)
Glucose, Bld: 182 mg/dL — ABNORMAL HIGH (ref 70–99)
Potassium: 3.4 mmol/L — ABNORMAL LOW (ref 3.5–5.1)
Sodium: 142 mmol/L (ref 135–145)

## 2020-09-21 LAB — TSH: TSH: 6.128 u[IU]/mL — ABNORMAL HIGH (ref 0.350–4.500)

## 2020-09-21 LAB — PROTIME-INR
INR: 1 (ref 0.8–1.2)
Prothrombin Time: 13.1 seconds (ref 11.4–15.2)

## 2020-09-21 LAB — TROPONIN I (HIGH SENSITIVITY)
Troponin I (High Sensitivity): 16 ng/L (ref <18)
Troponin I (High Sensitivity): 37 ng/L — ABNORMAL HIGH (ref <18)
Troponin I (High Sensitivity): 149 ng/L (ref <18)
Troponin I (High Sensitivity): 180 ng/L (ref <18)

## 2020-09-21 LAB — SARS CORONAVIRUS 2 BY RT PCR (HOSPITAL ORDER, PERFORMED IN ~~LOC~~ HOSPITAL LAB): SARS Coronavirus 2: NEGATIVE

## 2020-09-21 LAB — MAGNESIUM: Magnesium: 2 mg/dL (ref 1.7–2.4)

## 2020-09-21 LAB — GLUCOSE, CAPILLARY
Glucose-Capillary: 114 mg/dL — ABNORMAL HIGH (ref 70–99)
Glucose-Capillary: 142 mg/dL — ABNORMAL HIGH (ref 70–99)

## 2020-09-21 LAB — HEPARIN LEVEL (UNFRACTIONATED): Heparin Unfractionated: 0.6 IU/mL (ref 0.30–0.70)

## 2020-09-21 MED ORDER — POTASSIUM CHLORIDE CRYS ER 20 MEQ PO TBCR
40.0000 meq | EXTENDED_RELEASE_TABLET | Freq: Once | ORAL | Status: AC
  Administered 2020-09-21: 40 meq via ORAL
  Filled 2020-09-21: qty 2

## 2020-09-21 MED ORDER — CLOPIDOGREL BISULFATE 75 MG PO TABS
75.0000 mg | ORAL_TABLET | Freq: Every day | ORAL | Status: DC
  Administered 2020-09-22: 75 mg via ORAL
  Filled 2020-09-21: qty 1

## 2020-09-21 MED ORDER — ACETAMINOPHEN 325 MG PO TABS: 650.0000 mg | ORAL_TABLET | ORAL | Status: DC | PRN

## 2020-09-21 MED ORDER — NITROGLYCERIN 2 % TD OINT
0.5000 [in_us] | TOPICAL_OINTMENT | Freq: Four times a day (QID) | TRANSDERMAL | Status: DC
  Administered 2020-09-21 – 2020-09-22 (×5): 0.5 [in_us] via TOPICAL
  Filled 2020-09-21: qty 30

## 2020-09-21 MED ORDER — ONDANSETRON HCL 4 MG/2ML IJ SOLN: 4.0000 mg | Freq: Four times a day (QID) | INTRAMUSCULAR | Status: DC | PRN

## 2020-09-21 MED ORDER — NITROGLYCERIN 0.4 MG SL SUBL: 0.4000 mg | SUBLINGUAL_TABLET | SUBLINGUAL | Status: DC | PRN

## 2020-09-21 MED ORDER — CARVEDILOL 12.5 MG PO TABS: 12.5000 mg | ORAL_TABLET | Freq: Two times a day (BID) | ORAL | Status: DC

## 2020-09-21 MED ORDER — AMLODIPINE BESYLATE 10 MG PO TABS
10.0000 mg | ORAL_TABLET | Freq: Every day | ORAL | Status: DC
  Administered 2020-09-22: 10 mg via ORAL
  Filled 2020-09-21: qty 1

## 2020-09-21 MED ORDER — ATORVASTATIN CALCIUM 80 MG PO TABS
80.0000 mg | ORAL_TABLET | Freq: Every day | ORAL | Status: DC
  Administered 2020-09-22: 80 mg via ORAL
  Filled 2020-09-21: qty 1

## 2020-09-21 MED ORDER — INSULIN ASPART 100 UNIT/ML ~~LOC~~ SOLN
0.0000 [IU] | Freq: Three times a day (TID) | SUBCUTANEOUS | Status: DC
  Administered 2020-09-22: 2 [IU] via SUBCUTANEOUS

## 2020-09-21 MED ORDER — LEVOTHYROXINE SODIUM 25 MCG PO TABS
137.0000 ug | ORAL_TABLET | Freq: Every day | ORAL | Status: DC
  Administered 2020-09-22: 137 ug via ORAL
  Filled 2020-09-21: qty 1

## 2020-09-21 MED ORDER — ASPIRIN 81 MG PO CHEW
81.0000 mg | CHEWABLE_TABLET | Freq: Every day | ORAL | Status: DC
  Administered 2020-09-22: 81 mg via ORAL
  Filled 2020-09-21: qty 1

## 2020-09-21 MED ORDER — HEPARIN (PORCINE) 25000 UT/250ML-% IV SOLN
1600.0000 [IU]/h | INTRAVENOUS | Status: DC
  Administered 2020-09-21 – 2020-09-22 (×2): 1600 [IU]/h via INTRAVENOUS
  Filled 2020-09-21 (×2): qty 250

## 2020-09-21 MED ORDER — ISOSORBIDE MONONITRATE ER 60 MG PO TB24
60.0000 mg | ORAL_TABLET | Freq: Every day | ORAL | Status: DC
  Administered 2020-09-22: 60 mg via ORAL
  Filled 2020-09-21: qty 1

## 2020-09-21 MED ORDER — CARVEDILOL 12.5 MG PO TABS
12.5000 mg | ORAL_TABLET | Freq: Two times a day (BID) | ORAL | Status: DC
  Administered 2020-09-21 – 2020-09-22 (×3): 12.5 mg via ORAL
  Filled 2020-09-21 (×3): qty 1

## 2020-09-21 MED ORDER — HEPARIN BOLUS VIA INFUSION
5500.0000 [IU] | Freq: Once | INTRAVENOUS | Status: AC
  Administered 2020-09-21: 5500 [IU] via INTRAVENOUS
  Filled 2020-09-21: qty 5500

## 2020-09-21 NOTE — H&P (Addendum)
Cardiology Admission History and Physical:   Patient ID: William Mcmahon MRN: [id number]; DOB: 1960/10/15   Admission date: 09/21/2020  Primary Care Provider: Chesley Noon, MD [facility] Cardiologist: Minus Breeding, MD  [facility] Electrophysiologist:  None   Chief Complaint: jaw pain and palpitations   Patient Profile:   William Mcmahon is a 60 y.o. male with a history of CAD with prior stents to LADCA/Ramus Intermedius, hypertension, hyperlipidemia, type 2 diabetes, and hypothyroidism following complete thyroidectomy for thyroid cancer who presented to the ED on 09/21/2020 with jaw pain, his anginal equivalent) and was found to be in new onset atrial fibrillation with RVR.  History of Present Illness:   William Mcmahon is a 60 year old male with the above history who if followed by Dr. Percival Spanish. He has known CAD with prior stents to the LAD and RCA. Last cath in 08/2018 showed 99% stenosis of ostial ramus followed by 65% in-stent stenosis of prior ostial ramus stent. There was not a viable option for re-PCI so given size of ramus William Mcmahon. Medical therapy was recommended. A Zio Monitor was ordered for further evaluation of palpitations in 11/2018 and showed rare non-sustained atrial tachycardia (longest run being 5 beats) as well as some PACs/PVCs but no sustained arrhythmias. Patient was last seen by Dr. Percival Spanish in 10/2019 at which time he was doing well from a cardiac standpoint.  Patient presented to the ED today via EMS for further evaluation of jaw pain and palpitations and was found to be in atrial fibrillation.  Patient was in his usual state of health until this morning when he developed sudden onset of jaw pain, which is his prior anginal symptoms.  He states he woke up and walked downstairs to take all of his morning medications and then walked upstairs and developed severe jaw pain with associated shortness of breath, mild nausea (no vomiting), and palpitations.  He states he began  came so short of breath that he fell to the floor and his vision became blurry to the point where he could barely see the lights on the ceiling. He does not think he actually passed out. EMS was called and upon their arrival he was noted to be tachycardic with rates in the 150s to 190s.  He was given Cardizem 24mg , Aspirin 324 mg, and a dose of sublingual Nitro with resolution of symptoms but the time he arrived to the ED. he denies any recent chest pain or shortness of breath prior to this event.  Before the snowy weather earlier this week, he was walking several miles a day without any problems.  No orthopnea or edema. He occasionally has left leg swelling but none recently.  No recent fevers or illnesses.  He bleeds easily if he cuts himself on dual antiplatelet therapy but no abnormal bleeding in urine or stools.  Upon arrival to the ED, vitals stable. EKG showed atrial fibrillation, rate 75 bpm, with no acute ST/T changes. High-sensitivity troponin 16 >> 37. Chest x-ray showed no acute findings. WBC 7.6, Hgb 16.5, Plts 244. Na 142, K 3.4, Glucose 182, BUN 8, Cr 0.93. COVID-19 negative.  At the time of this evaluation, patient is resting comfortably. He is completely asymptomatic at this time.  He report prior tobacco use but quit smoking about 20 years ago. No alcohol or drug use. He does have a family history of heart disease with his father having CAD (with multiple stents and CABG).  Past Medical History:  Diagnosis Date  . CAD  in native artery, with prior stents, and instent restenosis of small vessel, medical therapy, LAD and RCA stents are patent 09/12/2018  . Cancer ([place])    thyroid  . Coronary artery disease    hx of stents  . DM (diabetes mellitus), type 2 ([place]) diet controlled 09/12/2018  . HLD (hyperlipidemia) 09/12/2018  . Hyperlipidemia   . Hypertension   . Syncope- may be due to diuretics 09/12/2018  . Thyroid disease     Past Surgical History:  Procedure Laterality Date  .  CARDIAC SURGERY     stents  . CHOLECYSTECTOMY    . LEFT HEART CATH AND CORONARY ANGIOGRAPHY N/A 09/11/2018   Procedure: LEFT HEART CATH AND CORONARY ANGIOGRAPHY;  Surgeon: Leonie Man, MD;  Location: [facility];  Service: Cardiovascular;  Laterality: N/A;     Medications Prior to Admission: Prior to Admission medications   Medication Sig Start Date End Date Taking? Authorizing Provider  amLODipine (NORVASC) 10 MG tablet Take 1 tablet by mouth once daily 02/05/20  Yes Minus Breeding, MD  aspirin EC 81 MG EC tablet Take 1 tablet (81 mg total) by mouth daily. 09/13/18  Yes Isaiah Serge, NP  atorvastatin (LIPITOR) 80 MG tablet Take 1 tablet by mouth once daily 03/13/20  Yes Minus Breeding, MD  carvedilol (COREG) 12.5 MG tablet Take 1 tablet (12.5 mg total) by mouth 2 (two) times daily with a meal. 03/04/20  Yes Minus Breeding, MD  clopidogrel (PLAVIX) 75 MG tablet Take 1 tablet by mouth once daily 02/05/20  Yes Minus Breeding, MD  isosorbide mononitrate (IMDUR) 60 MG 24 hr tablet Take 1 tablet by mouth once daily 06/02/20  Yes Hochrein, Jeneen Rinks, MD  levothyroxine (SYNTHROID) 137 MCG tablet Take 137 mcg by mouth daily. 08/01/20  Yes [provider]  nitroGLYCERIN (NITROSTAT) 0.4 MG SL tablet Place 1 tablet (0.4 mg total) under the tongue every 5 (five) minutes as needed for chest pain. 09/25/18  Yes Lendon Colonel, NP  levothyroxine (SYNTHROID) 125 MCG tablet Take 1 tablet (125 mcg total) by mouth daily before breakfast. Patient not taking: Reported on 09/21/2020 02/21/20   Minus Breeding, MD     Allergies:    Allergies  Allergen Reactions  . Naproxen Swelling    Social History:   Social History   Socioeconomic History  . Marital status: Married    Spouse name: Not on file  . Number of children: Not on file  . Years of education: Not on file  . Highest education level: Not on file  Occupational History  . Not on file  Tobacco Use  . Smoking status: Former Research scientist (life sciences)   . Smokeless tobacco: Never Used  Substance and Sexual Activity  . Alcohol use: Never  . Drug use: Never  . Sexual activity: Not on file  Other Topics Concern  . Not on file  Social History Narrative  . Not on file   Social Determinants of Health   Financial Resource Strain: Not on file  Food Insecurity: Not on file  Transportation Needs: Not on file  Physical Activity: Not on file  Stress: Not on file  Social Connections: Not on file  Intimate Partner Violence: Not on file    Family History:   The patient's family history includes CAD in his father; Heart attack in his maternal grandfather.    ROS:  Please see the history of present illness.  Review of Systems  Constitutional: Negative for fever.  HENT: Negative for congestion.  Eyes: Positive for blurred vision.  Respiratory: Positive for shortness of breath. Negative for cough.   Cardiovascular: Positive for palpitations. Negative for orthopnea, leg swelling and PND. Chest pain: jaw pain (anginal equivalent)  Gastrointestinal: Positive for nausea. Negative for abdominal pain, blood in stool, melena and vomiting.  Genitourinary: Negative for hematuria.  Musculoskeletal: Positive for neck pain. Negative for myalgias.  Neurological: Negative for loss of consciousness.  Endo/Heme/Allergies: Bruises/bleeds easily.  Psychiatric/Behavioral: Negative for substance abuse (prior tobacco use).    Physical Exam/Data:   Vitals:   09/21/20 1300 09/21/20 1330 09/21/20 1400 09/21/20 1415  BP: 113/74 119/72 127/85 129/72  Pulse: (!) 49 (!) 49 (!) 51 (!) 50  Resp: 13 12 16 11   Temp:      TempSrc:      SpO2: 95% 96% 99% 95%  Weight:      Height:       No intake or output data in the 24 hours ending 09/21/20 1443 Last 3 Weights 09/21/2020 10/03/2019 03/05/2019  Weight (lbs) 250 lb 232 lb 210 lb  Weight (kg) 113.399 kg 105.235 kg 95.255 kg     Body mass index is 35.87 kg/m.  General: 60 y.o. male resting comfortably in no acute  distress. HEENT: Normocephalic and atraumatic. Sclera clear.  Neck: Supple. No carotid bruits. No JVD. Heart: Bradycardic with normal rhythm. Distinct S1 and S2. No murmurs, gallops, or rubs. Radial and distal pedal pulses 2+ and equal bilaterally. Lungs: No increased work of breathing. Clear to ausculation bilaterally. No wheezes, rhonchi, or rales.  Abdomen: Soft, non-distended, and non-tender to palpation. Bowel sounds present. Extremities: No lower extremity edema.    Skin: Warm and dry. Neuro: Alert and oriented x3. No focal deficits. Psych: Normal affect. Responds appropriately.  EKG:  The ECG that was done was personally reviewed and demonstrates normal sinus rhythm, rate 75 bpm, with no acute ST/T changes.   Telemetry: Telemetry reviewed and patient now in sinus rhythm with rates in the high 40's to 60's and PVCs.  Relevant CV Studies:  Echocardiogram 09/11/2018: Study Conclusions: - Left ventricle: The cavity size was normal. Systolic function was  normal. The estimated ejection fraction was in the range of 55%  to 60%. Wall motion was normal; there were no regional wall  motion abnormalities. Doppler parameters are consistent with  abnormal left ventricular relaxation (grade 1 diastolic  dysfunction).  - Pulmonary arteries: PA peak pressure: 32 mm Hg (S).  _______________  Left Cardiac Catheterization 09/11/2018:  The left ventricular systolic function is normal. The left ventricular ejection fraction is 55-65% by visual estimate. LV end diastolic pressure is normal.  RCAl Ost RCA to Mid RCA overlapping stents (proximal from 2013, mid from 2019) -- 5% stenosed.  --- Mid RCA to Dist RCA lesion is 30% stenosed with 30% stenosed side Madlyn Crosby in Acute Mrg.  --- Ost RPDA lesion is 75% stenosed. 2nd RPLB lesion is 70% stenosed.  LAD: Prox LAD stent (Xience 3.25 mm x 18 mm-2018) is 5% stenosed.  -- Prox LAD to Mid LAD lesion is 55% stenosed - after D1.  Ost Ramus  lesion is 99% stenosed. Ost Ramus to Ramus stent (Xience 2.25 mm x 15 mm-2018) is 65% stenosed.  Mid Cx to Dist Cx lesion is 50% stenosed. Dist Cx lesion is 60% stenosed.   Summary:  Culprit lesion is likely the severe in-stent restenosis and near occlusion of the proximal diagonal-ramus intermedius that was previously treated with a DES stent --> not a viable option  for re-PCI as I cannot find the ostium of this vessel.  Patent LAD and RCA stents.  Diffuse mild disease in the RCA with previously reported roughly 80% ostial PDA.  Diffuse mild to moderate disease in the mid LAD with roughly long segment of 50% after major diagonal William Mcmahon.  Tandem moderate lesions in major OM William Mcmahon.  Normal LVEF and normal EDP.   Films reviewed with Dr. Burt Mcmahon, we both felt that the small ramus intermedius William Mcmahon is not viable for reattempted PCI.  The other remaining lesions are moderate and we would be best served treated medically. Recommendation would be to continue to manage medically per primary team.  Recommendation:  Return to nursing unit with ongoing care.  TR band removal per protocol.  Anticipate discharge tomorrow morning based on the late case.  Continue aggressive risk factor modification  Continue titrating antianginal medications -would consider Ranexa and or Imdur as he is already on good doses of carvedilol and amlodipine. _______________  William Mcmahon Monitor 11/2018: NSR Rare nonsustained atrial tachycardia longest run five beats PACs Rare PVCs No sustained arrhythmias.     Laboratory Data:  High Sensitivity Troponin:   Recent Labs  Lab 09/21/20 1052 09/21/20 1210  TROPONINIHS 16 37*      Chemistry Recent Labs  Lab 09/21/20 1052  NA 142  K 3.4*  CL 103  CO2 25  GLUCOSE 182*  BUN 8  CREATININE 0.93  CALCIUM 9.3  GFRNONAA >60  ANIONGAP 14    No results for input(s): PROT, ALBUMIN, AST, ALT, ALKPHOS, BILITOT in the last 168 hours. Hematology Recent Labs  Lab  09/21/20 1052  WBC 7.6  RBC 5.20  HGB 16.5  HCT 46.4  MCV 89.2  MCH 31.7  MCHC 35.6  RDW 12.3  PLT 244   BNPNo results for input(s): BNP, PROBNP in the last 168 hours.  DDimer No results for input(s): DDIMER in the last 168 hours.   Radiology/Studies:  DG Chest 2 View  Result Date: 09/21/2020 CLINICAL DATA:  Chest pain EXAM: CHEST - 2 VIEW COMPARISON:  09/10/2018 FINDINGS: Lungs are clear.  No pleural effusion or pneumothorax. The heart is normal in size. Mild degenerative changes of the visualized thoracolumbar spine. Cholecystectomy clips. IMPRESSION: Normal chest radiographs. Electronically Signed   By: Julian Hy M.D.   On: 09/21/2020 11:05     Assessment and Plan:   Jaw/Neck Pain History of CAD - Patient presented with jaw/neck pain (his anginal equivalent) in setting of new onset atrial fibrillation with RVR. Patient has history of CAD with prior stents to RCA, LAD, and Ramus Intermedius.  Last cath in 08/2018 showed 99% stenosis of ostial ramus followed by 65% in-stent stenosis of prior ostial ramus stent. There was not a viable option for re-PCI so given size of ramus William Mcmahon. Medical therapy was recommended. - EKG showed rate controlled atrial fibrillation with no acute ST/T changes. - High-sensitivity troponin 16 >> 37. Will continue to trend. - Patient currently back in sinus rhythm and pain free. - Will check Echo. - Suspect this was all demand ischemia secondary to atrial fibrillation with rates as high as the 190's and underlying CAD. However, we will admit overnight for observation. Continue to trend troponin. Will check Echo. Will start IV Heparin for now. If he troponin are flat and Echo is normal, can stop and switch to Eliquis tomorrow. Continue beta-blocker and high-intensity statin. Continue Amlodipine and Imdur.  New Onset Atrial Fibrillation - Rates reportedly as high as 150's to  190's in the field. Improved with Cardizem. He has since converted and is  currently in sinus rhythm with rates in the 40's to 60's.  - Potassium 3.4. Will give dose of K-Dur 40 mEq.  - Will check Magnesium and TSH. - Will check Echo. - Continue home Coreg 12.5mg  twice daily. Suspect rates are slow right now due to Cardizem. Can adjust Coreg as needed. - CHA2DS2-VASc = 3 (CAD, HTN, DM). Will start IV Heparin for now. If he rules out for MI, will likely stop Aspirin and start Eliquis 5mg  twice daily.  Hypertension - BP currently well controlled. - Continue home medications: Amlodipine 10mg  daily, Imdur 60mg  daily, and Coreg 12.5mg  twice daily.  Hyperlipidemia - Continue home Lipitor 80mg  daily. - Will recheck lipid panel tomorrow morning.  Type 2 Diabetes Mellitus - History of diabetes listed in chart but not on any medications at home. - Will check Hemoglobin A1c.   Hypothryoidism - Patient s/p thyroidectomy due to thyroid cancer. - Continue home Synthroid 137 mcg daily.   Risk Assessment/Risk Scores:   TIMI Risk Score for Unstable Angina or Non-ST Elevation MI:   The patient's TIMI risk score is 4, which indicates a 20% risk of all cause mortality, new or recurrent myocardial infarction or need for urgent revascularization in the next 14 days.{   CHA2DS2-VASc Score = 3  This indicates a 3.2% annual risk of stroke. The patient's score is based upon: CHF History: No HTN History: Yes Diabetes History: Yes Stroke History: No Vascular Disease History: Yes Age Score: 0 Gender Score: 0  Severity of Illness: The appropriate patient status for this patient is OBSERVATION. Observation status is judged to be reasonable and necessary in order to provide the required intensity of service to ensure the patient's safety. The patient's presenting symptoms, physical exam findings, and initial radiographic and laboratory data in the context of their medical condition is felt to place them at decreased risk for further clinical deterioration. Furthermore, it is  anticipated that the patient will be medically stable for discharge from the hospital within 2 midnights of admission. The following factors support the patient status of observation.   " The patient's presenting symptoms include chest pain, shortness of breath, and palpitations. " The physical exam findings as above. " The initial radiographic and laboratory data are significant for mildly elevated troponin.     For questions or updates, please contact [place] Please consult [email or web address] for contact info under     Signed, Eppie Gibson  09/21/2020 2:43 PM   Attending note Patient seen and disucssed with PA William Mcmahon, I agree with her docuemenation. 60 yo male history of CAD with prior stents, HL, HTN, DM2 admitted with jaw pain similar to his prior angina however also with palpitations. EMS evaluated and found to be in afib with RVR I 150s, given diltiazem in the field with improved rates. At time of ER evaluatoin chest pain had resolved. Initial troponin normal, mild uptrend on delta likely rate related. Considered ER discharge as appears to have been rate related pain and trop elevation, however patient uncomofrtable given these symptoms were very similar to his prior angina. With uptrending delta trop reasonable to obs overnight and cycle enzymes, repeat echo.   For afib rates low currently, he did get cardizem by EMS. Continue his regular coreg dosing, may titrate up tomorrow pending HRs once cardizem washes out. Would d/c his DAPT, start hep gtt overnight and if no further evidence of ACS convert to  DOAC tomorrow   Carlyle Dolly MD

## 2020-09-21 NOTE — ED Provider Notes (Signed)
[facility] Provider Note   CSN: [id number] Arrival date & time: 09/21/20  1022     History Chief Complaint  William Mcmahon presents with  . Chest Pain    William Mcmahon is a 60 y.o. male.  60 year old male with prior medical history as detailed below presents for evaluation of chest discomfort.  William Mcmahon reports onset of substernal left-sided chest pain with radiation to the left jaw.  Symptoms started at 7 AM this morning.  William Mcmahon also reports feeling that William Mcmahon heart was going very fast.  William Mcmahon reports that EMS transported William Mcmahon to the ED.  During transport William Mcmahon began to feel better.    EMS reports the William Mcmahon was in A. fib with RVR with a heart rate in the 150s to 190s.  William Mcmahon was given 24 mg of Cardizem enroute to the ED.  Upon arrival to the ED William Mcmahon is now in normal sinus rhythm with a heart rate in the 60s.  William Mcmahon does have a history of A. fib.  William Mcmahon was also administered 1 nitroglycerin and aspirin by EMS.  At the time of my exam, the William Mcmahon is pain-free.  William Mcmahon denies current shortness of breath.  William Mcmahon reports that William Mcmahon is known to Dr. Percival Spanish of cardiology.  William Mcmahon last catheterization with stent placement was approximately 1 year prior.  The history is provided by the William Mcmahon, medical records and the EMS personnel.  Chest Pain Pain location:  Substernal area and L chest Pain quality: aching and pressure   Pain radiates to:  L jaw Pain severity:  Moderate Onset quality:  Sudden Duration:  3 hours Timing:  Rare Progression:  Resolved Chronicity:  New Relieved by:  Nothing Worsened by:  Nothing Ineffective treatments:  None tried      Past Medical History:  Diagnosis Date  . CAD in native artery, with prior stents, and instent restenosis of small vessel, medical therapy, LAD and RCA stents are patent 09/12/2018  . Cancer ([place])    thyroid  . Coronary artery disease    hx of stents  . DM (diabetes mellitus), type 2 ([place]) diet controlled  09/12/2018  . HLD (hyperlipidemia) 09/12/2018  . Hyperlipidemia   . Hypertension   . Syncope- may be due to diuretics 09/12/2018  . Thyroid disease     William Mcmahon Active Problem List   Diagnosis Date Noted  . Educated about COVID-19 virus infection 02/07/2019  . Essential hypertension 11/29/2018  . Palpitations 11/29/2018  . SOB (shortness of breath) 11/29/2018  . CAD in native artery, with prior stents, and instent restenosis of small vessel, medical therapy, LAD and RCA stents are patent 09/12/2018  . HLD (hyperlipidemia) 09/12/2018  . DM (diabetes mellitus), type 2 ([place]) diet controlled 09/12/2018  . Hypokalemia 09/12/2018  . Syncope- may be due to diuretics 09/12/2018  . Unstable angina ([place]) 09/10/2018    Past Surgical History:  Procedure Laterality Date  . CARDIAC SURGERY     stents  . CHOLECYSTECTOMY    . LEFT HEART CATH AND CORONARY ANGIOGRAPHY N/A 09/11/2018   Procedure: LEFT HEART CATH AND CORONARY ANGIOGRAPHY;  Surgeon: Leonie Man, MD;  Location: [facility];  Service: Cardiovascular;  Laterality: N/A;       No family history on file.  Social History   Tobacco Use  . Smoking status: Former Research scientist (life sciences)  . Smokeless tobacco: Never Used  Substance Use Topics  . Alcohol use: Never  . Drug use: Never    Home Medications Prior  to Admission medications   Medication Sig Start Date End Date Taking? Authorizing Provider  amLODipine (NORVASC) 10 MG tablet Take 1 tablet by mouth once daily 02/05/20   Minus Breeding, MD  aspirin EC 81 MG EC tablet Take 1 tablet (81 mg total) by mouth daily. 09/13/18   Isaiah Serge, NP  atorvastatin (LIPITOR) 80 MG tablet Take 1 tablet by mouth once daily 03/13/20   Minus Breeding, MD  carvedilol (COREG) 12.5 MG tablet Take 1 tablet (12.5 mg total) by mouth 2 (two) times daily with a meal. 03/04/20   Minus Breeding, MD  celecoxib (CELEBREX) 50 MG capsule Take 50 mg by mouth 2 (two) times daily.    [provider]   clopidogrel (PLAVIX) 75 MG tablet Take 1 tablet by mouth once daily 02/05/20   Minus Breeding, MD  isosorbide mononitrate (IMDUR) 60 MG 24 hr tablet Take 1 tablet by mouth once daily 06/02/20   Minus Breeding, MD  levothyroxine (SYNTHROID) 125 MCG tablet Take 1 tablet (125 mcg total) by mouth daily before breakfast. 02/21/20   Minus Breeding, MD  nitroGLYCERIN (NITROSTAT) 0.4 MG SL tablet Place 1 tablet (0.4 mg total) under the tongue every 5 (five) minutes as needed for chest pain. 09/25/18   Lendon Colonel, NP    Allergies    Naproxen  Review of Systems   Review of Systems  Cardiovascular: Positive for chest pain.  All other systems reviewed and are negative.   Physical Exam Updated Vital Signs BP 114/75 (BP Location: Right Arm)   Pulse 88   Temp 97.9 F (36.6 C) (Oral)   Resp 17   Ht 5\' 10"  (1.778 m)   Wt 113.4 kg   SpO2 98%   BMI 35.87 kg/m   Physical Exam Vitals and nursing note reviewed.  Constitutional:      General: William Mcmahon is not in acute distress.    Appearance: William Mcmahon is well-developed and well-nourished.  HENT:     Head: Normocephalic and atraumatic.     Mouth/Throat:     Mouth: Oropharynx is clear and moist.  Eyes:     Extraocular Movements: EOM normal.     Conjunctiva/sclera: Conjunctivae normal.     Pupils: Pupils are equal, round, and reactive to light.  Cardiovascular:     Rate and Rhythm: Normal rate and regular rhythm.     Heart sounds: Normal heart sounds.  Pulmonary:     Effort: Pulmonary effort is normal. No respiratory distress.     Breath sounds: Normal breath sounds.  Abdominal:     General: There is no distension.     Palpations: Abdomen is soft.     Tenderness: There is no abdominal tenderness.  Musculoskeletal:        General: No deformity or edema. Normal range of motion.     Cervical back: Normal range of motion and neck supple.  Skin:    General: Skin is warm and dry.  Neurological:     Mental Status: William Mcmahon is alert and oriented to  person, place, and time.  Psychiatric:        Mood and Affect: Mood and affect normal.     ED Results / Procedures / Treatments   Labs (all labs ordered are listed, but only abnormal results are displayed) Labs Reviewed  BASIC METABOLIC PANEL - Abnormal; Notable for the following components:      Result Value   Potassium 3.4 (*)    Glucose, Bld 182 (*)    All  other components within normal limits  TROPONIN I (HIGH SENSITIVITY) - Abnormal; Notable for the following components:   Troponin I (High Sensitivity) 37 (*)    All other components within normal limits  SARS CORONAVIRUS 2 BY RT PCR (HOSPITAL ORDER, [facility])  CBC  PROTIME-INR  TROPONIN I (HIGH SENSITIVITY)    EKG EKG Interpretation  Date/Time:  Sunday September 21 2020 10:35:47 EST Ventricular Rate:  75 PR Interval:    QRS Duration: 104 QT Interval:  388 QTC Calculation: 433 R Axis:   -9 Text Interpretation: Atrial fibrillation Abnormal ECG Confirmed by Dene Gentry [phone]) on 09/21/2020 10:56:51 AM   Radiology DG Chest 2 View  Result Date: 09/21/2020 CLINICAL DATA:  Chest pain EXAM: CHEST - 2 VIEW COMPARISON:  09/10/2018 FINDINGS: Lungs are clear.  No pleural effusion or pneumothorax. The heart is normal in size. Mild degenerative changes of the visualized thoracolumbar spine. Cholecystectomy clips. IMPRESSION: Normal chest radiographs. Electronically Signed   By: Julian Hy M.D.   On: 09/21/2020 11:05    Procedures Procedures (including critical care time)  Medications Ordered in ED Medications - No data to display  ED Course  I have reviewed the triage vital signs and the nursing notes.  Pertinent labs & imaging results that were available during my care of the William Mcmahon were reviewed by me and considered in my medical decision making (see chart for details).    MDM Rules/Calculators/A&P                          MDM  Screen complete  Omarian Jaquith was evaluated  in Emergency Department on 09/21/2020 for the symptoms described in the history of present illness. William Mcmahon was evaluated in the context of the global COVID-19 pandemic, which necessitated consideration that the William Mcmahon might be at risk for infection with the SARS-CoV-2 virus that causes COVID-19. Institutional protocols and algorithms that pertain to the evaluation of patients at risk for COVID-19 are in a state of rapid change based on information released by regulatory bodies including the CDC and federal and state organizations. These policies and algorithms were followed during the William Mcmahon's care in the ED.   William Mcmahon presenting with reported chest discomfort.  William Mcmahon's chest discomfort apparently preceded palpitations.  EMS noted heart rate into the 150s.  EMS administered 24 mg of Cardizem.  William Mcmahon was in normal sinus rhythm on arrival to the ED.  William Mcmahon without prior history of A. Fib.  William Mcmahon is chest pain-free on arrival.  Troponins demonstrate delta of 21.  EKG is without acute ischemic change.  Case discussed with cardiology - Dr. Harl Bowie.  William Mcmahon will be admitted for further work-up and observation.   Final Clinical Impression(s) / ED Diagnoses Final diagnoses:  Chest pain, unspecified type    Rx / DC Orders ED Discharge Orders    None       Valarie Merino, MD 09/21/20 1320

## 2020-09-21 NOTE — Progress Notes (Signed)
[facility] for heparin Indication: chest pain/ACS and atrial fibrillation    Labs: Recent Labs    09/21/20 1052 09/21/20 1210 09/21/20 1614 09/21/20 1737 09/21/20 2132  HGB 16.5  --   --   --   --   HCT 46.4  --   --   --   --   PLT 244  --   --   --   --   LABPROT 13.1  --   --   --   --   INR 1.0  --   --   --   --   HEPARINUNFRC  --   --   --   --  0.60  CREATININE 0.93  --   --   --   --   TROPONINIHS 16 37* 149* 180*  --     Assessment: 77 YOM presenting with jaw pain, hx CAD, new onset afib w/RVR.  He is not on anticoagulation PTA, CBC wnl. Initial heparin level 0.60 units/ml  Goal of Therapy:  Heparin level 0.3-0.7 units/ml Monitor platelets by anticoagulation protocol: Yes   Plan:  Continue Heparin drip 1600 units/hr F/u am labs F/u cards plan for DOAC tomorrow AM  Thanks for allowing pharmacy to be a part of this patient's care.  Excell Seltzer, PharmD Clinical Pharmacist 09/21/2020 10:46 PM

## 2020-09-21 NOTE — ED Triage Notes (Signed)
Arrived via EMS; from home. C/O chest pain this morning, along w/ jaw pain. Endorsed extensive hx of Afib and  MI w/ stent placements. EMS stated initial 12 lead EKG rhythm 150-190s, irregular. Cardizem 24 mg total given en route along w/ ASA 324 mg and NTG x1;

## 2020-09-21 NOTE — Progress Notes (Addendum)
Patient discussed with ER staff. Onset of chest pains and palpitations at home. EMS found him in afib with rates in 150s, given diltiazem and brought to ER. In ER rates WNL, EKG shows he remains in afib which is new diagnosis for patient. EKG without ischemic changes, initial trop is negative  Chest pain and palpitaitons have resolved, appear to be due to new onset afib which is rate controlled. Would increase his coreg to 25mg  bid, stop is aspirn and plavix and start eliquis 5mg  bid. If delta trop without significant rise would be ok for discharge from ER, we will arrange close f/u.    Addendum 116pm Mild uptrend in 2nd trop may be afib related. Patient with concerns given similarities of chest pain to his prior angina, we will plan to obs overnight   Carlyle Dolly MD

## 2020-09-21 NOTE — Progress Notes (Signed)
ANTICOAGULATION CONSULT NOTE - Initial Consult  Pharmacy Consult for heparin Indication: chest pain/ACS and atrial fibrillation   Allergies  Allergen Reactions  . Naproxen Swelling    Patient Measurements: Height: 5\' 10"  (177.8 cm) Weight: 113.4 kg (250 lb) IBW/kg (Calculated) : 73 Heparin Dosing Weight: 97.9kg  Vital Signs: Temp: 97.9 F (36.6 C) (01/23 1040) Temp Source: Oral (01/23 1040) BP: 129/72 (01/23 1415) Pulse Rate: 50 (01/23 1415)  Labs: Recent Labs    09/21/20 1052 09/21/20 1210  HGB 16.5  --   HCT 46.4  --   PLT 244  --   LABPROT 13.1  --   INR 1.0  --   CREATININE 0.93  --   TROPONINIHS 16 37*    Estimated Creatinine Clearance: 109.2 mL/min (by C-G formula based on SCr of 0.93 mg/dL).   Medical History: Past Medical History:  Diagnosis Date  . CAD in native artery, with prior stents, and instent restenosis of small vessel, medical therapy, LAD and RCA stents are patent 09/12/2018  . Cancer ([place])    thyroid  . Coronary artery disease    hx of stents  . DM (diabetes mellitus), type 2 ([place]) diet controlled 09/12/2018  . HLD (hyperlipidemia) 09/12/2018  . Hyperlipidemia   . Hypertension   . Syncope- may be due to diuretics 09/12/2018  . Thyroid disease     Assessment: 34 YOM presenting with jaw pain, hx CAD, new onset afib w/RVR.  He is not on anticoagulation PTA, CBC wnl.  Goal of Therapy:  Heparin level 0.3-0.7 units/ml Monitor platelets by anticoagulation protocol: Yes   Plan:  Heparin 5500 units IV x 1, and gtt at 1600 units/hr F/u 6 hour heparin level F/u cards plan for DOAC tomorrow AM  Bertis Ruddy, PharmD Clinical Pharmacist ED Pharmacist Phone # [phone] 09/21/2020 3:21 PM

## 2020-09-22 ENCOUNTER — Other Ambulatory Visit: Payer: Self-pay | Admitting: Physician Assistant

## 2020-09-22 ENCOUNTER — Observation Stay (HOSPITAL_BASED_OUTPATIENT_CLINIC_OR_DEPARTMENT_OTHER): Payer: Medicare Other

## 2020-09-22 DIAGNOSIS — E876 Hypokalemia: Secondary | ICD-10-CM

## 2020-09-22 DIAGNOSIS — I248 Other forms of acute ischemic heart disease: Secondary | ICD-10-CM

## 2020-09-22 DIAGNOSIS — I4891 Unspecified atrial fibrillation: Secondary | ICD-10-CM

## 2020-09-22 LAB — CBC
HCT: 43.3 % (ref 39.0–52.0)
Hemoglobin: 14.6 g/dL (ref 13.0–17.0)
MCH: 30.7 pg (ref 26.0–34.0)
MCHC: 33.7 g/dL (ref 30.0–36.0)
MCV: 91.2 fL (ref 80.0–100.0)
Platelets: 218 10*3/uL (ref 150–400)
RBC: 4.75 MIL/uL (ref 4.22–5.81)
RDW: 12.3 % (ref 11.5–15.5)
WBC: 8.8 10*3/uL (ref 4.0–10.5)
nRBC: 0 % (ref 0.0–0.2)

## 2020-09-22 LAB — GLUCOSE, CAPILLARY
Glucose-Capillary: 117 mg/dL — ABNORMAL HIGH (ref 70–99)
Glucose-Capillary: 196 mg/dL — ABNORMAL HIGH (ref 70–99)
Glucose-Capillary: 102 mg/dL — ABNORMAL HIGH (ref 70–99)

## 2020-09-22 LAB — ECHOCARDIOGRAM COMPLETE
Area-P 1/2: 3.48 cm2
Calc EF: 58.6 %
Height: 70 in
S' Lateral: 3.3 cm
Single Plane A2C EF: 59.9 %
Single Plane A4C EF: 57.9 %
Weight: 4000 oz

## 2020-09-22 LAB — HEPARIN LEVEL (UNFRACTIONATED): Heparin Unfractionated: 0.58 IU/mL (ref 0.30–0.70)

## 2020-09-22 LAB — HEMOGLOBIN A1C
Hgb A1c MFr Bld: 6.1 % — ABNORMAL HIGH (ref 4.8–5.6)
Mean Plasma Glucose: 128.37 mg/dL

## 2020-09-22 LAB — BASIC METABOLIC PANEL
Anion gap: 11 (ref 5–15)
BUN: 8 mg/dL (ref 6–20)
CO2: 26 mmol/L (ref 22–32)
Calcium: 8.8 mg/dL — ABNORMAL LOW (ref 8.9–10.3)
Chloride: 106 mmol/L (ref 98–111)
Creatinine, Ser: 0.8 mg/dL (ref 0.61–1.24)
GFR, Estimated: >60 mL/min (ref >60)
Glucose, Bld: 128 mg/dL — ABNORMAL HIGH (ref 70–99)
Potassium: 2.9 mmol/L — ABNORMAL LOW (ref 3.5–5.1)
Sodium: 143 mmol/L (ref 135–145)

## 2020-09-22 LAB — LIPID PANEL
Cholesterol: 129 mg/dL (ref 0–200)
HDL: 28 mg/dL — ABNORMAL LOW (ref >40)
LDL Cholesterol: 83 mg/dL (ref 0–99)
Total CHOL/HDL Ratio: 4.6 RATIO
Triglycerides: 89 mg/dL (ref <150)
VLDL: 18 mg/dL (ref 0–40)

## 2020-09-22 MED ORDER — APIXABAN 5 MG PO TABS: 5.0000 mg | ORAL_TABLET | Freq: Two times a day (BID) | ORAL | 3 refills | Status: DC

## 2020-09-22 MED ORDER — POTASSIUM CHLORIDE CRYS ER 20 MEQ PO TBCR
40.0000 meq | EXTENDED_RELEASE_TABLET | Freq: Three times a day (TID) | ORAL | Status: DC
  Administered 2020-09-22 (×2): 40 meq via ORAL
  Filled 2020-09-22 (×2): qty 2

## 2020-09-22 MED ORDER — APIXABAN 5 MG PO TABS: 5.0000 mg | ORAL_TABLET | Freq: Two times a day (BID) | ORAL | 0 refills | Status: DC

## 2020-09-22 MED ORDER — APIXABAN 5 MG PO TABS
5.0000 mg | ORAL_TABLET | Freq: Two times a day (BID) | ORAL | Status: DC
  Administered 2020-09-22: 5 mg via ORAL
  Filled 2020-09-22: qty 1

## 2020-09-22 MED ORDER — POTASSIUM CHLORIDE ER 10 MEQ PO TBCR: 10.0000 meq | EXTENDED_RELEASE_TABLET | Freq: Every day | ORAL | 2 refills | Status: DC

## 2020-09-22 NOTE — Discharge Instructions (Signed)

## 2020-09-22 NOTE — Care Management Obs Status (Signed)
[facility] NOTIFICATION   Patient Details  Name: William Mcmahon MRN: [id number] Date of Birth: 01-Sep-1960   Medicare Observation Status Notification Given:  Yes    Zenon Mayo, RN 09/22/2020, 5:48 PM

## 2020-09-22 NOTE — Discharge Summary (Addendum)
Discharge Summary    Patient ID: Square Jowett MRN: [id number]; DOB: 1960/10/03  Admit date: 09/21/2020 Discharge date: 09/22/2020  Primary Care Provider: Chesley Noon, MD  Primary Cardiologist: Minus Breeding, MD  Primary Electrophysiologist:  None   Discharge Diagnoses    Principal Problem:   Atrial fibrillation with RVR [facility]) Active Problems:   CAD in native artery, with prior stents, and instent restenosis of small vessel, medical therapy, LAD and RCA stents are patent   HLD (hyperlipidemia)   DM (diabetes mellitus), type 2 ([place]) diet controlled   Essential hypertension   Demand ischemia ([place])    Diagnostic Studies/Procedures     Echo 09/22/2020 1. Left ventricular ejection fraction, by estimation, is 55 to 60%. The  left ventricle has normal function. The left ventricle has no regional  wall motion abnormalities. Left ventricular diastolic parameters are  consistent with Grade I diastolic  dysfunction (impaired relaxation).  2. Right ventricular systolic function is normal. The right ventricular  size is mildly enlarged. There is normal pulmonary artery systolic  pressure. The estimated right ventricular systolic pressure is 24.5 mmHg.  3. The mitral valve is abnormal. Trivial mitral valve regurgitation.  4. The aortic valve is normal in structure. There is mild thickening of  the aortic valve. Aortic valve regurgitation is not visualized.  5. The inferior vena cava is normal in size with greater than 50%  respiratory variability, suggesting right atrial pressure of 3 mmHg.   Comparison(s): No significant change from prior study.  _____________   History of Present Illness     Alexandria Current is a 60 y.o. male with a history of CAD with prior stents to LADCA/Ramus Intermedius, hypertension, hyperlipidemia, type 2 diabetes, and hypothyroidism following complete thyroidectomy for thyroid cancer who presented to the ED on 09/21/2020 with jaw pain, his anginal  equivalent) and was found to be in new onset atrial fibrillation with RVR.   Mr. Radel is a 60 year old male with the above history who if followed by Dr. Percival Spanish. He has known CAD with prior stents to the LAD and RCA. Last cath in 08/2018 showed 99% stenosis of ostial ramus followed by 65% in-stent stenosis of prior ostial ramus stent. There was not a viable option for re-PCI so given size of ramus branch. Medical therapy was recommended. A Zio Monitor was ordered for further evaluation of palpitations in 11/2018 and showed rare non-sustained atrial tachycardia (longest run being 5 beats) as well as some PACs/PVCs but no sustained arrhythmias. Patient was last seen by Dr. Percival Spanish in 10/2019 at which time he was doing well from a cardiac standpoint.  Patient presented to the ED today via EMS for further evaluation of jaw pain and palpitations and was found to be in atrial fibrillation.  Patient was in his usual state of health until this morning when he developed sudden onset of jaw pain, which is his prior anginal symptoms.  He states he woke up and walked downstairs to take all of his morning medications and then walked upstairs and developed severe jaw pain with associated shortness of breath, mild nausea (no vomiting), and palpitations.  He states he began came so short of breath that he fell to the floor and his vision became blurry to the point where he could barely see the lights on the ceiling. He does not think he actually passed out. EMS was called and upon their arrival he was noted to be tachycardic with rates in the 150s to 190s.  He  was given Cardizem 24mg , Aspirin 324 mg, and a dose of sublingual Nitro with resolution of symptoms but the time he arrived to the ED. he denies any recent chest pain or shortness of breath prior to this event.  Before the snowy weather earlier this week, he was walking several miles a day without any problems.  No orthopnea or edema. He occasionally has left leg  swelling but none recently.  No recent fevers or illnesses.  He bleeds easily if he cuts himself on dual antiplatelet therapy but no abnormal bleeding in urine or stools.  Upon arrival to the ED, vitals stable. EKG showed atrial fibrillation, rate 75 bpm, with no acute ST/T changes. High-sensitivity troponin 16 >> 37. Chest x-ray showed no acute findings. WBC 7.6, Hgb 16.5, Plts 244. Na 142, K 3.4, Glucose 182, BUN 8, Cr 0.93. COVID-19 negative.  At the time of this evaluation, patient is resting comfortably. He is completely asymptomatic at this time.  He report prior tobacco use but quit smoking about 20 years ago. No alcohol or drug use. He does have a family history of heart disease with his father having CAD (with multiple stents and CABG).   Hospital Course     Consultants: N/A  Patient was kept overnight for observation.  Serial troponin trended up to 180, this was felt to be demand ischemia in the setting of rate controlled A. fib.  As soon as the patient self converted back to sinus rhythm, the jaw pain and neck pain went away.  He was seen in the morning of 09/21/2020 at which time he was doing well without chest pain, jaw pain, or shortness of breath.  Echocardiogram obtained on the same day showed EF 55 to 60%, grade 1 DD, normal pulmonary artery systolic pressure, trivial MR.  Overall, echocardiogram has not changed from the previous study.  Patient is deemed stable for discharge from cardiology perspective.  His aspirin was stopped and he was started on Eliquis 5 mg BID.   Note, his K was low during this admission, I started him on 10 meq of KCl, he will need BMET in 1-2 weeks in [place] and follow up in 2-4 weeks with Dr. Percival Spanish.    Did the patient have an acute coronary syndrome (MI, NSTEMI, STEMI, etc) this admission?:  No.   The elevated Troponin was due to the acute medical illness (demand ischemia).      _____________  Discharge Vitals Blood pressure  131/86, pulse 62, temperature 98.4 F (36.9 C), temperature source Oral, resp. rate 20, height 5\' 10"  (1.778 m), weight 113.4 kg, SpO2 97 %.  Filed Weights   09/21/20 1041  Weight: 113.4 kg    Labs & Radiologic Studies    CBC Recent Labs    09/21/20 1052 09/22/20 0303  WBC 7.6 8.8  HGB 16.5 14.6  HCT 46.4 43.3  MCV 89.2 91.2  PLT 244 [id number]   Basic Metabolic Panel Recent Labs    09/21/20 1052 09/21/20 1614 09/22/20 0303  NA 142  --  143  K 3.4*  --  2.9*  CL 103  --  106  CO2 25  --  26  GLUCOSE 182*  --  128*  BUN 8  --  8  CREATININE 0.93  --  0.80  CALCIUM 9.3  --  8.8*  MG  --  2.0  --    Liver Function Tests No results for input(s): AST, ALT, ALKPHOS, BILITOT, PROT, ALBUMIN in the last 72  hours. No results for input(s): LIPASE, AMYLASE in the last 72 hours. High Sensitivity Troponin:   Recent Labs  Lab 09/21/20 1052 09/21/20 1210 09/21/20 1614 09/21/20 1737  TROPONINIHS 16 37* 149* 180*    BNP Invalid input(s): POCBNP D-Dimer No results for input(s): DDIMER in the last 72 hours. Hemoglobin A1C Recent Labs    09/22/20 0303  HGBA1C 6.1*   Fasting Lipid Panel Recent Labs    09/22/20 0303  CHOL 129  HDL 28*  LDLCALC 83  TRIG 89  CHOLHDL 4.6   Thyroid Function Tests Recent Labs    09/21/20 1615  TSH 6.128*   _____________  DG Chest 2 View  Result Date: 09/21/2020 CLINICAL DATA:  Chest pain EXAM: CHEST - 2 VIEW COMPARISON:  09/10/2018 FINDINGS: Lungs are clear.  No pleural effusion or pneumothorax. The heart is normal in size. Mild degenerative changes of the visualized thoracolumbar spine. Cholecystectomy clips. IMPRESSION: Normal chest radiographs. Electronically Signed   By: Julian Hy M.D.   On: 09/21/2020 11:05   ECHOCARDIOGRAM COMPLETE  Result Date: 09/22/2020    ECHOCARDIOGRAM REPORT   Patient Name:   KYLAND Bessinger Date of Exam: 09/22/2020 Medical Rec #:  [id number]      Height:       70.0 in Accession #:    [id number]      Weight:       250.0 lb Date of Birth:  Apr 03, 1961      BSA:          2.295 m Patient Age:    48 years       BP:           145/85 mmHg Patient Gender: M              HR:           57 bpm. Exam Location:  Inpatient Procedure: 2D Echo, Cardiac Doppler and Color Doppler Indications:    Atrial Fibrillation I48.91  History:        Patient has prior history of Echocardiogram examinations, most                 recent 09/11/2018. CAD; Risk Factors:Hypertension, Diabetes,                 Dyslipidemia and Former Smoker.  Sonographer:    Vickie Epley RDCS Referring Phys: [id number] Darreld Mclean  Sonographer Comments: Image acquisition challenging due to respiratory motion. IMPRESSIONS  1. Left ventricular ejection fraction, by estimation, is 55 to 60%. The left ventricle has normal function. The left ventricle has no regional wall motion abnormalities. Left ventricular diastolic parameters are consistent with Grade I diastolic dysfunction (impaired relaxation).  2. Right ventricular systolic function is normal. The right ventricular size is mildly enlarged. There is normal pulmonary artery systolic pressure. The estimated right ventricular systolic pressure is 32.9 mmHg.  3. The mitral valve is abnormal. Trivial mitral valve regurgitation.  4. The aortic valve is normal in structure. There is mild thickening of the aortic valve. Aortic valve regurgitation is not visualized.  5. The inferior vena cava is normal in size with greater than 50% respiratory variability, suggesting right atrial pressure of 3 mmHg. Comparison(s): No significant change from prior study. FINDINGS  Left Ventricle: Left ventricular ejection fraction, by estimation, is 55 to 60%. The left ventricle has normal function. The left ventricle has no regional wall motion abnormalities. The left ventricular internal cavity size was normal in size. There is  no left ventricular  hypertrophy. Left ventricular diastolic parameters are consistent with Grade I diastolic  dysfunction (impaired relaxation). Right Ventricle: The right ventricular size is mildly enlarged. No increase in right ventricular wall thickness. Right ventricular systolic function is normal. There is normal pulmonary artery systolic pressure. The tricuspid regurgitant velocity is 2.15  m/s, and with an assumed right atrial pressure of 3 mmHg, the estimated right ventricular systolic pressure is [id number] mmHg. Left Atrium: Left atrial size was normal in size. Right Atrium: Right atrial size was normal in size. Pericardium: There is no evidence of pericardial effusion. Mitral Valve: The mitral valve is abnormal. There is mild thickening of the mitral valve leaflet(s). There is mild calcification of the mitral valve leaflet(s). Trivial mitral valve regurgitation. Tricuspid Valve: The tricuspid valve is normal in structure. Tricuspid valve regurgitation is trivial. Aortic Valve: The aortic valve is normal in structure. There is mild thickening of the aortic valve. Aortic valve regurgitation is not visualized. Pulmonic Valve: The pulmonic valve was normal in structure. Pulmonic valve regurgitation is trivial. Aorta: The aortic root is normal in size and structure. Venous: The inferior vena cava is normal in size with greater than 50% respiratory variability, suggesting right atrial pressure of 3 mmHg. IAS/Shunts: No atrial level shunt detected by color flow Doppler.  LEFT VENTRICLE PLAX 2D LVIDd:         5.30 cm      Diastology LVIDs:         3.30 cm      LV e' medial:    3.75 cm/s LV PW:         0.90 cm      LV E/e' medial:  16.5 LV IVS:        0.90 cm      LV e' lateral:   5.43 cm/s LVOT diam:     2.30 cm      LV E/e' lateral: 11.4 LV SV:         73 LV SV Index:   32 LVOT Area:     4.15 cm  LV Volumes (MOD) LV vol d, MOD A2C: 108.0 ml LV vol d, MOD A4C: 128.0 ml LV vol s, MOD A2C: 43.3 ml LV vol s, MOD A4C: 53.9 ml LV SV MOD A2C:     64.7 ml LV SV MOD A4C:     128.0 ml LV SV MOD BP:      69.5 ml RIGHT VENTRICLE RV S  prime:     15.60 cm/s TAPSE (M-mode): 2.2 cm LEFT ATRIUM             Index       RIGHT ATRIUM           Index LA diam:        4.70 cm 2.05 cm/m  RA Area:     15.50 cm LA Vol (A2C):   30.4 ml 13.25 ml/m RA Volume:   42.50 ml  18.52 ml/m LA Vol (A4C):   43.3 ml 18.87 ml/m LA Biplane Vol: 36.4 ml 15.86 ml/m  AORTIC VALVE LVOT Vmax:   76.20 cm/s LVOT Vmean:  48.400 cm/s LVOT VTI:    0.175 m  AORTA Ao Root diam: 3.30 cm MITRAL VALVE               TRICUSPID VALVE MV Area (PHT): 3.48 cm    TR Peak grad:   18.5 mmHg MV Decel Time: 218 msec    TR Vmax:        215.00  cm/s MV E velocity: 62.00 cm/s MV A velocity: 70.30 cm/s  SHUNTS MV E/A ratio:  0.88        Systemic VTI:  0.18 m                            Systemic Diam: 2.30 cm Gwyndolyn Kaufman MD Electronically signed by Gwyndolyn Kaufman MD Signature Date/Time: 09/22/2020/2:59:01 PM    Final    Disposition   Pt is being discharged home today in good condition.  Follow-up Plans & Appointments     Follow-up Information    Minus Breeding, MD Follow up.   Specialty: Cardiology Why: scheduler will contact you to arrange 2-4 weeks follow up with Dr. Percival Spanish. You will also need 1-2 week BMET lab in [place] to follow up on the potassium level. Contact information: [address] [place] [address] [phone]              Discharge Instructions    Diet - low sodium heart healthy   Complete by: As directed    Increase activity slowly   Complete by: As directed       Discharge Medications   Allergies as of 09/22/2020      Reactions   Naproxen Swelling      Medication List    STOP taking these medications   aspirin 81 MG EC tablet     TAKE these medications   amLODipine 10 MG tablet Commonly known as: NORVASC Take 1 tablet by mouth once daily   apixaban 5 MG Tabs tablet Commonly known as: ELIQUIS Take 1 tablet (5 mg total) by mouth 2 (two) times daily.   atorvastatin 80 MG tablet Commonly known  as: LIPITOR Take 1 tablet by mouth once daily   carvedilol 12.5 MG tablet Commonly known as: COREG Take 1 tablet (12.5 mg total) by mouth 2 (two) times daily with a meal.   clopidogrel 75 MG tablet Commonly known as: PLAVIX Take 1 tablet by mouth once daily   isosorbide mononitrate 60 MG 24 hr tablet Commonly known as: IMDUR Take 1 tablet by mouth once daily   levothyroxine 137 MCG tablet Commonly known as: SYNTHROID Take 137 mcg by mouth daily. What changed: Another medication with the same name was removed. Continue taking this medication, and follow the directions you see here.   nitroGLYCERIN 0.4 MG SL tablet Commonly known as: NITROSTAT Place 1 tablet (0.4 mg total) under the tongue every 5 (five) minutes as needed for chest pain.   potassium chloride 10 MEQ tablet Commonly known as: KLOR-CON Take 1 tablet (10 mEq total) by mouth daily.          Outstanding Labs/Studies   BMET in 1-2 weeks  Duration of Discharge Encounter   Greater than 30 minutes including physician time.  Hilbert Corrigan, [place] 09/22/2020, 5:41 PM

## 2020-09-22 NOTE — Progress Notes (Addendum)
Progress Note  Patient Name: William Mcmahon Date of Encounter: 09/22/2020  [facility] Cardiologist: Minus Breeding, MD   Subjective   Denies any further chest pain or jaw pain  Inpatient Medications    Scheduled Meds: . amLODipine  10 mg Oral Daily  . aspirin  81 mg Oral Daily  . atorvastatin  80 mg Oral Daily  . carvedilol  12.5 mg Oral BID WC  . clopidogrel  75 mg Oral Daily  . insulin aspart  0-9 Units Subcutaneous TID WC  . isosorbide mononitrate  60 mg Oral Daily  . levothyroxine  137 mcg Oral Daily  . nitroGLYCERIN  0.5 inch Topical Q6H   Continuous Infusions: . heparin 1,600 Units/hr (09/22/20 0415)   PRN Meds: acetaminophen, nitroGLYCERIN, ondansetron (ZOFRAN) IV   Vital Signs    Vitals:   09/21/20 1843 09/21/20 2035 09/22/20 0411 09/22/20 0741  BP:  126/74 123/90 (!) 145/85  Pulse: 64 61 63 63  Resp:   16 18  Temp:  98.2 F (36.8 C) 98.4 F (36.9 C) 98.4 F (36.9 C)  TempSrc:  Oral Oral Oral  SpO2:  96% 97% 95%  Weight:      Height:        Intake/Output Summary (Last 24 hours) at 09/22/2020 0935 Last data filed at 09/22/2020 0900 Gross per 24 hour  Intake 660 ml  Output 275 ml  Net 385 ml   Last 3 Weights 09/21/2020 10/03/2019 03/05/2019  Weight (lbs) 250 lb 232 lb 210 lb  Weight (kg) 113.399 kg 105.235 kg 95.255 kg      Telemetry    NSR with HR low 60s - Personally Reviewed  ECG    Atrial fibrillation with controlled HR - Personally Reviewed  Physical Exam   GEN: No acute distress.   Neck: No JVD Cardiac: RRR, no murmurs, rubs, or gallops.  Respiratory: Clear to auscultation bilaterally. GI: Soft, nontender, non-distended  MS: No edema; No deformity. Neuro:  Nonfocal  Psych: Normal affect   Labs    High Sensitivity Troponin:   Recent Labs  Lab 09/21/20 1052 09/21/20 1210 09/21/20 1614 09/21/20 1737  TROPONINIHS 16 37* 149* 180*      Chemistry Recent Labs  Lab 09/21/20 1052 09/22/20 0303  NA 142 143  K 3.4* 2.9*   CL 103 106  CO2 25 26  GLUCOSE 182* 128*  BUN 8 8  CREATININE 0.93 0.80  CALCIUM 9.3 8.8*  GFRNONAA >60 >60  ANIONGAP 14 11     Hematology Recent Labs  Lab 09/21/20 1052 09/22/20 0303  WBC 7.6 8.8  RBC 5.20 4.75  HGB 16.5 14.6  HCT 46.4 43.3  MCV 89.2 91.2  MCH 31.7 30.7  MCHC 35.6 33.7  RDW 12.3 12.3  PLT 244 218    BNPNo results for input(s): BNP, PROBNP in the last 168 hours.   DDimer No results for input(s): DDIMER in the last 168 hours.   Radiology    DG Chest 2 View  Result Date: 09/21/2020 CLINICAL DATA:  Chest pain EXAM: CHEST - 2 VIEW COMPARISON:  09/10/2018 FINDINGS: Lungs are clear.  No pleural effusion or pneumothorax. The heart is normal in size. Mild degenerative changes of the visualized thoracolumbar spine. Cholecystectomy clips. IMPRESSION: Normal chest radiographs. Electronically Signed   By: Julian Hy M.D.   On: 09/21/2020 11:05    Cardiac Studies   Echo 09/11/2018 LV EF: 55% -  60%   -------------------------------------------------------------------   Study Conclusions   - Left  ventricle: The cavity size was normal. Systolic function was  normal. The estimated ejection fraction was in the range of 55%  to 60%. Wall motion was normal; there were no regional wall  motion abnormalities. Doppler parameters are consistent with  abnormal left ventricular relaxation (grade 1 diastolic  dysfunction).  - Pulmonary arteries: PA peak pressure: 32 mm Hg (S).   Cath 09/11/2018  The left ventricular systolic function is normal. The left ventricular ejection fraction is 55-65% by visual estimate. LV end diastolic pressure is normal.  RCAl Ost RCA to Mid RCA overlapping stents (proximal from 2013, mid from 2019) -- 5% stenosed.  --- Mid RCA to Dist RCA lesion is 30% stenosed with 30% stenosed side branch in Acute Mrg.  --- Ost RPDA lesion is 75% stenosed. 2nd RPLB lesion is 70% stenosed.  LAD: Prox LAD stent (Xience 3.25 mm x 18  mm-2018) is 5% stenosed.  -- Prox LAD to Mid LAD lesion is 55% stenosed - after D1.  Ost Ramus lesion is 99% stenosed. Ost Ramus to Ramus stent (Xience 2.25 mm x 15 mm-2018) is 65% stenosed.  Mid Cx to Dist Cx lesion is 50% stenosed. Dist Cx lesion is 60% stenosed.   SUMMARY  Culprit lesion is likely the severe in-stent restenosis and near occlusion of the proximal diagonal-ramus intermedius that was previously treated with a DES stent --> not a viable option for re-PCI as I cannot find the ostium of this vessel.  Patent LAD and RCA stents.  Diffuse mild disease in the RCA with previously reported roughly 80% ostial PDA.  Diffuse mild to moderate disease in the mid LAD with roughly long segment of 50% after major diagonal branch.  Tandem moderate lesions in major OM branch.  Normal LVEF and normal EDP.   Films reviewed with Dr. Burt Knack, we both felt that the small ramus intermedius branch is not viable for reattempted PCI.  The other remaining lesions are moderate and we would be best served treated medically. Recommendation would be to continue to manage medically per primary team.  RECOMMENDATION  Return to nursing unit with ongoing care.  TR band removal per protocol.  Anticipate discharge tomorrow morning based on the late case.  Continue aggressive risk factor modification  Continue titrating antianginal medications -would consider Ranexa and or Imdur as he is already on good doses of carvedilol and amlodipine.    Patient Profile     60 y.o. male with PMH of CAD, HTN, HLD, DM II and hypothyroidism presented with jaw/neck pain which is his previous anginal equivalent and found to be in new afib with RVR  Assessment & Plan    1. New atrial fibrillation with RVR  - CHA2DS2-Vasc score 3 (CAD, HTN, DM II)  - self converted to sinus rhythm  - will need NOAC, holding off initiation until echocardiogram come back in case need for ischemic eval  2. CAD  - initial  presentation of neck and jaw pain was his anginal equivalent.   - last cath in Jan 2020 showed 99% ramus not viable for PCI. 75% RPDA lesion. Medical management  - serial troponin 16 --> 37 --> 149 --> 180  - unclear if demand ischemia in the setting of rate controlled afib. Pending echocardiogram to reassess EF and wall motion, may need outpatient myoview.   - on aspirin and plavix, if starting Eliquis, will need to drop the aspirin  3. HTN  4. HLD  5. DM II   For questions or updates,  please contact [place] Please consult [email or web address] for contact info under        Signed, Almyra Deforest, [place]  09/22/2020, 9:35 AM    I have personally seen and examined this patient. I agree with the assessment and plan as outlined above.  He is known to have CAD. Presented with jaw pain during episode of atrial fib with RVR ( EMS reported rates as high as 190s). Converted to sinus and jaw pain resolved. Mild elevation troponin. Likely demand ischemia in setting of rapid heart rate. Echo pending today. If LV function is normal, will start Eliquis today and drop ASA. Continue Coreg. Will d/c home today if echo ok with no plans for ischemic evaluation. He has no angina type pains at home with exertion.   Lauree Chandler 09/22/2020 10:15 AM

## 2020-09-22 NOTE — TOC Transition Note (Signed)
Transition of Care [facility]) - CM/SW Discharge Note   Patient Details  Name: Keiton Cosma MRN: [id number] Date of Birth: 03-01-1961  [facility]) CM/SW Contact:  Zenon Mayo, RN Phone Number: 09/22/2020, 2:19 PM   Clinical Narrative:    NCM spoke with patient , he states he does not need any DME , he has PCP, he has no issues with transportation.  NCM informed him looks like co pay for eliquis is 142.00 for refill, NCM gave 10.00 co pay card to Staff RN Eun to give to patien, informed patient that he can use this co pay card once the medication deductible has been met.  He is for possible dc today.   Final next level of care: Home/Self Care Barriers to Discharge: Continued Medical Work up   Patient Goals and CMS Choice Patient states their goals for this hospitalization and ongoing recovery are:: get better   Choice offered to / list presented to : NA  Discharge Placement                       Discharge Plan and Services                  DME Agency: NA       HH Arranged: NA          Social Determinants of Health (SDOH) Interventions     Readmission Risk Interventions No flowsheet data found.

## 2020-09-22 NOTE — Progress Notes (Addendum)
ANTICOAGULATION CONSULT NOTE   Pharmacy Consult for heparin Indication: chest pain/ACS and atrial fibrillation   Allergies  Allergen Reactions   Naproxen Swelling    Patient Measurements: Height: 5\' 10"  (177.8 cm) Weight: 113.4 kg (250 lb) IBW/kg (Calculated) : 73 Heparin Dosing Weight: 97.9kg  Vital Signs: Temp: 98.4 F (36.9 C) (01/24 0741) Temp Source: Oral (01/24 0741) BP: 145/85 (01/24 0741) Pulse Rate: 63 (01/24 0741)  Labs: Recent Labs    09/21/20 1052 09/21/20 1210 09/21/20 1614 09/21/20 1737 09/21/20 2132 09/22/20 0303  HGB 16.5  --   --   --   --  14.6  HCT 46.4  --   --   --   --  43.3  PLT 244  --   --   --   --  218  LABPROT 13.1  --   --   --   --   --   INR 1.0  --   --   --   --   --   HEPARINUNFRC  --   --   --   --  0.60 0.58  CREATININE 0.93  --   --   --   --  0.80  TROPONINIHS 16 37* 149* 180*  --   --     Estimated Creatinine Clearance: 127 mL/min (by C-G formula based on SCr of 0.8 mg/dL).   Assessment: 31 YOM presenting with jaw pain, hx CAD, new onset afib w/RVR.  He is not on anticoagulation PTA, CBC wnl.  Heparin level at goal this AM.  CBC stable, no overt bleeding or complications noted.  FYI - His copay for Eliquis will be $142 per month, however, the 1st 30 days would be free, and then could provide with Eliquis copay card after that.  Will ask TOC team to provide card.  Goal of Therapy:  Heparin level 0.3-0.7 units/ml Monitor platelets by anticoagulation protocol: Yes   Plan:  Heparin IV at 1600 units/hr Daily heparin level and CBC. F/u plans for Eliquis today.  [place] Crane, Roylene Reason, [facility] Clinical Pharmacist  09/22/2020 8:44 AM   [facility] pharmacy phone numbers are listed on [email or web address]

## 2020-09-22 NOTE — Progress Notes (Signed)
Potassium = 2.9. MD paged. ?

## 2020-09-22 NOTE — Progress Notes (Signed)
  Echocardiogram 2D Echocardiogram has been performed.  Michiel Cowboy 09/22/2020, 11:53 AM

## 2020-09-22 NOTE — Progress Notes (Signed)
bm 

## 2020-09-29 ENCOUNTER — Other Ambulatory Visit (HOSPITAL_COMMUNITY)
Admission: RE | Admit: 2020-09-29 | Discharge: 2020-09-29 | Disposition: A | Discharge status: Home / Self Care | Payer: Medicare Other | Source: Ambulatory Visit | Attending: Physician Assistant | Admitting: Physician Assistant

## 2020-09-29 ENCOUNTER — Other Ambulatory Visit: Payer: Self-pay

## 2020-09-29 DIAGNOSIS — E876 Hypokalemia: Secondary | ICD-10-CM | POA: Diagnosis present

## 2020-09-29 LAB — BASIC METABOLIC PANEL
Anion gap: 8 (ref 5–15)
BUN: 10 mg/dL (ref 6–20)
CO2: 27 mmol/L (ref 22–32)
Calcium: 9.4 mg/dL (ref 8.9–10.3)
Chloride: 102 mmol/L (ref 98–111)
Creatinine, Ser: 0.86 mg/dL (ref 0.61–1.24)
GFR, Estimated: >60 mL/min (ref >60)
Glucose, Bld: 170 mg/dL — ABNORMAL HIGH (ref 70–99)
Potassium: 3.3 mmol/L — ABNORMAL LOW (ref 3.5–5.1)
Sodium: 137 mmol/L (ref 135–145)

## 2020-09-30 ENCOUNTER — Telehealth: Payer: Self-pay

## 2020-09-30 NOTE — Telephone Encounter (Signed)
William Mcmahon is calling back in regards to this due to not getting a callback. Please advise.

## 2020-09-30 NOTE — Telephone Encounter (Signed)
Patient is returning call.  °

## 2020-09-30 NOTE — Telephone Encounter (Signed)
Attempted to call patient, left message for patient to call back to office.   

## 2020-09-30 NOTE — Telephone Encounter (Addendum)
Left a detailed message for the patient to give the office a call back for his lab results and medications.  ----- Message from Almyra Deforest, [place] sent at 09/29/2020  8:43 PM EST ----- Potassium improving, but still low. Double up potassium supplement to 20 meq daily for 5 days before going back to 10 meq daily thereafter, repeat BMET in 2-3 weeks.

## 2020-10-01 ENCOUNTER — Telehealth: Payer: Self-pay | Admitting: Cardiology

## 2020-10-01 DIAGNOSIS — E876 Hypokalemia: Secondary | ICD-10-CM

## 2020-10-01 NOTE — Telephone Encounter (Signed)
Patient is returning Jenna's call. Please advise. 

## 2020-10-01 NOTE — Telephone Encounter (Signed)
Advised patient of lab results, verbalized understanding    William Mcmahon, [place]  09/29/2020 8:43 PM EST      Potassium improving, but still low. Double up potassium supplement to 20 meq daily for 5 days before going back to 10 meq daily thereafter, repeat BMET in 2-3 weeks

## 2020-10-07 NOTE — Progress Notes (Signed)
Cardiology Office Note   Date:  10/10/2020   ID:  William Mcmahon, DOB September 11, 1960, MRN [id number]  PCP:  William Noon, MD  Cardiologist: Dr. Percival Spanish CC: Hospital follow up    History of Present Illness: William Mcmahon is a 60 y.o. male who presents for posthospitalization follow-up after admission on 09/21/2020, in the setting of of chest discomfort and new onset atrial fibrillation with RVR.  He also has a history of CAD with prior stents to the LAD and RCA.  Last cardiac catheterization on 09/18/2018 revealed 99% stenosis of the ostial ramus followed by 65% in-stent stenosis of prior ostial ramus stent.  There was not a viable option for rePCI so given the size of the ramus branch medical therapy was recommended.  A CO monitor was ordered for further evaluation of his complaints of palpitations in April 2020 and this showed rare nonsustained atrial tachycardia as well as some PACs and PVCs but no sustained arrhythmias.  However on presentation to the ED via EMS the patient was complaining of palpitations and jaw pain, and heart rates in the 150s to 190s.  He was given Cardizem, aspirin, and a dose of sublingual nitroglycerin with resolution of symptoms.  It was reported that prior to a snow event earlier that week he had been walking several miles a day without any issues.  He denied any exercise-induced symptoms.  He was kept overnight for observation, serial troponin trended up to 280, but was felt to be demand ischemia in the setting of rate control A. fib.  The patient self converted to normal sinus rhythm, and any discomfort in his jaw and neck subsided.  An echocardiogram was obtained the same day which showed an EF of 55 to 60% with grade 1 diastolic dysfunction, normal pulmonary artery systolic pressures and trivial MR.  Was also noted that his potassium was low during admission and therefore he was started on 10 mEq of potassium daily.  He is to have a follow-up BMET today.  He was sent  home on apixaban 5 mg twice daily, amlodipine 10 mg daily, atorvastatin 80 mg daily, carvedilol 12.5 mg twice daily, clopidogrel 75 mg daily, isosorbide mononitrate 60 mg daily, and levothyroxine as he was taking prior to admission at 137 mcg daily.  He comes today with multiple non-cardiac complaints. He reports trouble swallowing. States food sticks in his throat and he is unable to feel it, or control swallowing. He has passed out when the food gets stuck because he cannot breath.  He has a history of thyroid cancer with radiation and thyroidectomy. He states that his swallowing was not that bad post op but has become progressively worse.   He reports generalized musculoskeletal pain, left shoulder, hands, feet and knees. He often drops things out of his hands and does not  Know it until the object falls.   Concerning atrial fib symptoms, he has no complaints of racing, irregular, or thumping heart rhythm or rate. No excessive bleeding on Eliquis but does request samples. He is medically compliant.   Past Medical History:  Diagnosis Date  . CAD in native artery, with prior stents, and instent restenosis of small vessel, medical therapy, LAD and RCA stents are patent 09/12/2018  . Cancer ([place])    thyroid  . Coronary artery disease    hx of stents  . DM (diabetes mellitus), type 2 ([place]) diet controlled 09/12/2018  . HLD (hyperlipidemia) 09/12/2018  . Hyperlipidemia   . Hypertension   . Syncope-  may be due to diuretics 09/12/2018  . Thyroid disease     Past Surgical History:  Procedure Laterality Date  . CARDIAC SURGERY     stents  . CHOLECYSTECTOMY    . LEFT HEART CATH AND CORONARY ANGIOGRAPHY N/A 09/11/2018   Procedure: LEFT HEART CATH AND CORONARY ANGIOGRAPHY;  Surgeon: William Man, MD;  Location: [place] [facility];  Service: Cardiovascular;  Laterality: N/A;     Current Outpatient Medications  Medication Sig Dispense Refill  . amLODipine (NORVASC) 10 MG tablet Take 1 tablet by  mouth once daily 90 tablet 3  . apixaban (ELIQUIS) 5 MG TABS tablet Take 1 tablet (5 mg total) by mouth 2 (two) times daily. 180 tablet 3  . atorvastatin (LIPITOR) 80 MG tablet Take 1 tablet by mouth once daily 90 tablet 3  . carvedilol (COREG) 12.5 MG tablet Take 1 tablet (12.5 mg total) by mouth 2 (two) times daily with a meal. 180 tablet 3  . clopidogrel (PLAVIX) 75 MG tablet Take 1 tablet by mouth once daily 90 tablet 3  . isosorbide mononitrate (IMDUR) 60 MG 24 hr tablet Take 1 tablet by mouth once daily 90 tablet 1  . levothyroxine (SYNTHROID) 137 MCG tablet Take 137 mcg by mouth daily.    . nitroGLYCERIN (NITROSTAT) 0.4 MG SL tablet Place 1 tablet (0.4 mg total) under the tongue every 5 (five) minutes as needed for chest pain. 25 tablet 1  . potassium chloride (KLOR-CON) 10 MEQ tablet Take 1 tablet (10 mEq total) by mouth daily. 60 tablet 2   No current facility-administered medications for this visit.    Allergies:   Naproxen    Social History:  The patient  reports that he has quit smoking. He has never used smokeless tobacco. He reports that he does not drink alcohol and does not use drugs.   Family History:  The patient's family history includes CAD in his father; Heart attack in his maternal grandfather.    ROS: All other systems are reviewed and negative. Unless otherwise mentioned in H&P    PHYSICAL EXAM: VS:  BP 110/68   Pulse 62   Ht 5\' 10"  (1.778 m)   Wt 215 lb (97.5 kg)   SpO2 97%   BMI 30.85 kg/m  , BMI Body mass index is 30.85 kg/m. GEN: Well nourished, well developed, in no acute distress HEENT: normal Neck: no JVD, carotid bruits, or masses Cardiac: RRR; no murmurs, rubs, or gallops,no edema  Respiratory:  Clear to auscultation bilaterally, normal work of breathing GI: soft, nontender, nondistended, + BS MS: no deformity or atrophy Skin: warm and dry, no rash Neuro:  Strength and sensation are intact.no decrease in sensation when I touch hands or legs.   Psych: euthymic mood, full affect   EKG:  Sinus bradycardia rate of 56 bpm.  Personally reviewed.   Recent Labs: 09/21/2020: Magnesium 2.0; TSH 6.128 09/29/20: Hemoglobin 14.6; Platelets 218 09/29/2020: BUN 10; Creatinine, Ser 0.86; Potassium 3.3; Sodium 137    Lipid Panel    Component Value Date/Time   CHOL 129 29-Sep-2020 0303   TRIG 89 2020-09-29 0303   HDL 28 (L) 09/29/20 0303   CHOLHDL 4.6 09-29-2020 0303   VLDL 18 09/29/2020 0303   LDLCALC 83 2020-09-29 0303      Wt Readings from Last 3 Encounters:  10/10/20 215 lb (97.5 kg)  09/21/20 250 lb (113.4 kg)  10/03/19 232 lb (105.2 kg)      Other studies Reviewed: Echocardiogram 09-29-2020  1. Left ventricular ejection fraction, by estimation, is 55 to 60%. The  left ventricle has normal function. The left ventricle has no regional  wall motion abnormalities. Left ventricular diastolic parameters are  consistent with Grade I diastolic  dysfunction (impaired relaxation).  2. Right ventricular systolic function is normal. The right ventricular  size is mildly enlarged. There is normal pulmonary artery systolic  pressure. The estimated right ventricular systolic pressure is 51.8 mmHg.  3. The mitral valve is abnormal. Trivial mitral valve regurgitation.  4. The aortic valve is normal in structure. There is mild thickening of  the aortic valve. Aortic valve regurgitation is not visualized.  5. The inferior vena cava is normal in size with greater than 50%  respiratory variability, suggesting right atrial pressure of 3 mmHg.   Left Heart Cath 09/11/2018 1. Left ventricular ejection fraction, by estimation, is 55 to 60%. The  left ventricle has normal function. The left ventricle has no regional  wall motion abnormalities. Left ventricular diastolic parameters are  consistent with Grade I diastolic  dysfunction (impaired relaxation).  2. Right ventricular systolic function is normal. The right ventricular  size is  mildly enlarged. There is normal pulmonary artery systolic  pressure. The estimated right ventricular systolic pressure is 84.1 mmHg.  3. The mitral valve is abnormal. Trivial mitral valve regurgitation.  4. The aortic valve is normal in structure. There is mild thickening of  the aortic valve. Aortic valve regurgitation is not visualized.  5. The inferior vena cava is normal in size with greater than 50%  respiratory variability, suggesting right atrial pressure of 3 mmHg.   ASSESSMENT AND PLAN:  1. Paroxysmal Atrial Fibrillation: Now in NSR in the office today. He continues Eliquis 5 mg BID, carvedilol. Samples of Eliquis are provided. He will have CBC and BMET drawn today.   2. CAD:  Hx of stent to the LAD and RCA. With medical management of small Ramus branch. He remains on DAPT and has had no recurrent angina symptoms. Continue secondary prevention.   3. Hyperlipidemia: Continue on atorvastatin. Goal of LDL < 70 in patients with CAD.for cardioprotection. Follow up labs in 6 month unless drawn by PCP.   4. Dysphagia: Uncertain if he has neurologic issue causing paralyssi with inability to swallow food vs scarring from radiation and thyroid surgery affecting food motility within the throat. He reports that the thyroid tumor grew into his larynx and the surgeon was uncertain that he would be able to speak.   He may need to be seen by ENT vs GI for a swallowing study. My concern is aspiration should he be unable to swallow and have a syncopal episode. I have advised him to speak with his PCP for recommendations concerning appropriate referral.   5. Musculoskeletal pain: See PCP for pain control and appropriate referrals to orthopedist.     Current medicines are reviewed at length with the patient today.  I have spent 30 minutes dedicated to the care of this patient on the date of this encounter to include pre-visit review of records, assessment, management and diagnostic testing,with  shared decision making.  Labs/ tests ordered today include: BMET and CBC  Phill Myron. [place] Pugh, ANP, AACC   10/10/2020 4:06 PM    [facility] [place] [address] Office [phone] Fax [phone]  Notice: This dictation was prepared with Dragon dictation along with smaller phrase technology. Any transcriptional errors that result from this process are unintentional and may not  be corrected upon review.

## 2020-10-10 ENCOUNTER — Other Ambulatory Visit: Payer: Self-pay

## 2020-10-10 ENCOUNTER — Ambulatory Visit (INDEPENDENT_AMBULATORY_CARE_PROVIDER_SITE_OTHER): Payer: Medicare Other | Admitting: Adult Health

## 2020-10-10 ENCOUNTER — Encounter: Payer: Self-pay | Admitting: Adult Health

## 2020-10-10 VITALS — BP 110/68 | HR 62 | Ht 70.0 in | Wt 215.0 lb

## 2020-10-10 DIAGNOSIS — E785 Hyperlipidemia, unspecified: Secondary | ICD-10-CM

## 2020-10-10 DIAGNOSIS — I2510 Atherosclerotic heart disease of native coronary artery without angina pectoris: Secondary | ICD-10-CM | POA: Diagnosis not present

## 2020-10-10 DIAGNOSIS — I480 Paroxysmal atrial fibrillation: Secondary | ICD-10-CM | POA: Diagnosis not present

## 2020-10-10 DIAGNOSIS — Z79899 Other long term (current) drug therapy: Secondary | ICD-10-CM

## 2020-10-10 DIAGNOSIS — R1312 Dysphagia, oropharyngeal phase: Secondary | ICD-10-CM

## 2020-10-10 DIAGNOSIS — M7918 Myalgia, other site: Secondary | ICD-10-CM

## 2020-10-10 NOTE — Patient Instructions (Signed)
Medication Instructions:  Continue current medications  *If you need a refill on your cardiac medications before your next appointment, please call your pharmacy*   Lab Work: CBC and BMP  If you have labs (blood work) drawn today and your tests are completely normal, you will receive your results only by: Marland Kitchen MyChart Message (if you have MyChart) OR . A paper copy in the mail If you have any lab test that is abnormal or we need to change your treatment, we will call you to review the results.   Testing/Procedures: None Ordered   Follow-Up: At [facility], you and your health needs are our priority.  As part of our continuing mission to provide you with exceptional heart care, we have created designated Provider Care Teams.  These Care Teams include your primary Cardiologist (physician) and Advanced Practice Providers (APPs -  Physician Assistants and Nurse Practitioners) who all work together to provide you with the care you need, when you need it.  We recommend signing up for the patient portal called "MyChart".  Sign up information is provided on this After Visit Summary.  MyChart is used to connect with patients for Virtual Visits (Telemedicine).  Patients are able to view lab/test results, encounter notes, upcoming appointments, etc.  Non-urgent messages can be sent to your provider as well.   To learn more about what you can do with MyChart, go to [email or web address].    Your next appointment:   6 week(s)  The format for your next appointment:   In Person  Provider:   You may see Minus Breeding, MD or one of the following Advanced Practice Providers on your designated Care Team:    Rosaria Ferries, PA-C  Jory Sims, DNP, ANP

## 2020-10-11 LAB — BASIC METABOLIC PANEL
BUN/Creatinine Ratio: 16 (ref 9–20)
BUN: 18 mg/dL (ref 6–24)
CO2: 24 mmol/L (ref 20–29)
Calcium: 9.7 mg/dL (ref 8.7–10.2)
Chloride: 101 mmol/L (ref 96–106)
Creatinine, Ser: 1.15 mg/dL (ref 0.76–1.27)
GFR calc Af Amer: 81 mL/min/{1.73_m2} (ref >59)
GFR calc non Af Amer: 70 mL/min/{1.73_m2} (ref >59)
Glucose: 149 mg/dL — ABNORMAL HIGH (ref 65–99)
Potassium: 4.4 mmol/L (ref 3.5–5.2)
Sodium: 140 mmol/L (ref 134–144)

## 2020-10-11 LAB — CBC
Hematocrit: 43.1 % (ref 37.5–51.0)
Hemoglobin: 14.8 g/dL (ref 13.0–17.7)
MCH: 30.9 pg (ref 26.6–33.0)
MCHC: 34.3 g/dL (ref 31.5–35.7)
MCV: 90 fL (ref 79–97)
Platelets: 237 10*3/uL (ref 150–450)
RBC: 4.79 x10E6/uL (ref 4.14–5.80)
RDW: 12.5 % (ref 11.6–15.4)
WBC: 7.2 10*3/uL (ref 3.4–10.8)

## 2020-10-20 NOTE — Addendum Note (Signed)
Addended by: Wonda Horner on: 10/20/2020 02:29 PM   Modules accepted: Orders

## 2020-11-21 ENCOUNTER — Ambulatory Visit: Payer: Medicare Other | Admitting: Cardiology

## 2020-12-01 ENCOUNTER — Encounter: Payer: Self-pay | Admitting: Cardiology

## 2020-12-01 DIAGNOSIS — I480 Paroxysmal atrial fibrillation: Secondary | ICD-10-CM | POA: Insufficient documentation

## 2020-12-01 DIAGNOSIS — R1310 Dysphagia, unspecified: Secondary | ICD-10-CM | POA: Insufficient documentation

## 2020-12-01 NOTE — Progress Notes (Addendum)
Cardiology Office Note   Date:  12/02/2020   ID:  William Mcmahon, DOB 14-Nov-1960, MRN [id number]  PCP:  Chesley Noon, MD  Cardiologist:   Minus Breeding, MD   No chief complaint on file.     History of Present Illness: William Mcmahon is a 60 y.o. male who presents for evaluation of coronary disease.   He has a history of CAD as listed below.  He had palpitations.  He had PACs on ZIO patch.   Since I last saw him he was in the hospital in Jan with atrial fib with RVR.  He converted spontaneously.   He was complaining of dysphagia at the last visit.  Since he was last seen he denies any new cardiovascular symptoms.  He might feel occasional palpitations but is not something very significant and is not at all like his atrial fibrillation.  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  He does his activities of daily living but does not sound like he exercises routinely although he does go fishing.  He is not had any chest pressure, neck or arm discomfort.  Has had no weight gain or edema.    Past Medical History:  Diagnosis Date  . CAD in native artery, with prior stents, and instent restenosis of small vessel, medical therapy, LAD and RCA stents are patent 09/12/2018  . Cancer ([place])    thyroid  . DM (diabetes mellitus), type 2 ([place]) diet controlled 09/12/2018  . HLD (hyperlipidemia) 09/12/2018  . Hyperlipidemia   . Hypertension   . PAF (paroxysmal atrial fibrillation) ([place])   . Syncope- may be due to diuretics 09/12/2018  . Thyroid disease     Past Surgical History:  Procedure Laterality Date  . CARDIAC SURGERY     stents  . CHOLECYSTECTOMY    . LEFT HEART CATH AND CORONARY ANGIOGRAPHY N/A 09/11/2018   Procedure: LEFT HEART CATH AND CORONARY ANGIOGRAPHY;  Surgeon: Leonie Man, MD;  Location: [place] [facility];  Service: Cardiovascular;  Laterality: N/A;     Current Outpatient Medications  Medication Sig Dispense Refill  . amLODipine  (NORVASC) 10 MG tablet Take 1 tablet by mouth once daily 90 tablet 3  . apixaban (ELIQUIS) 5 MG TABS tablet Take 1 tablet (5 mg total) by mouth 2 (two) times daily. 180 tablet 3  . atorvastatin (LIPITOR) 80 MG tablet Take 1 tablet by mouth once daily 90 tablet 3  . carvedilol (COREG) 12.5 MG tablet Take 1 tablet (12.5 mg total) by mouth 2 (two) times daily with a meal. 180 tablet 3  . clopidogrel (PLAVIX) 75 MG tablet Take 1 tablet by mouth once daily 90 tablet 3  . isosorbide mononitrate (IMDUR) 60 MG 24 hr tablet Take 1 tablet by mouth once daily 90 tablet 1  . levothyroxine (SYNTHROID) 137 MCG tablet Take 137 mcg by mouth daily.    . nitroGLYCERIN (NITROSTAT) 0.4 MG SL tablet Place 1 tablet (0.4 mg total) under the tongue every 5 (five) minutes as needed for chest pain. 25 tablet 1  . potassium chloride (KLOR-CON) 10 MEQ tablet Take 1 tablet (10 mEq total) by mouth daily. 60 tablet 2   No current facility-administered medications for this visit.    Allergies:   Naproxen    ROS:  Please see the history of present illness.   Otherwise, review of systems are positive for dysphagia.   All other systems are reviewed and negative.  PHYSICAL EXAM: VS:  BP 134/78 (BP Location: Left Arm, Patient Position: Sitting, Cuff Size: Normal)   Pulse 60   Ht 5\' 10"  (1.778 m)   Wt 213 lb 3.2 oz (96.7 kg)   SpO2 98%   BMI 30.59 kg/m  , BMI Body mass index is 30.59 kg/m. GENERAL:  Well appearing NECK:  No jugular venous distention, waveform within normal limits, carotid upstroke brisk and symmetric, no bruits, no thyromegaly LUNGS:  Clear to auscultation bilaterally CHEST:  Unremarkable HEART:  PMI not displaced or sustained,S1 and S2 within normal limits, no S3, no S4, no clicks, no rubs, no murmurs ABD:  Flat, positive bowel sounds normal in frequency in pitch, no bruits, no rebound, no guarding, no midline pulsatile mass, no hepatomegaly, no splenomegaly EXT:  2 plus pulses throughout, no edema,  no cyanosis no clubbing    EKG:  EKG is not ordered today.    Recent Labs: 09/21/2020: Magnesium 2.0; TSH 6.128 10/10/2020: BUN 18; Creatinine, Ser 1.15; Hemoglobin 14.8; Platelets 237; Potassium 4.4; Sodium 140    Lipid Panel    Component Value Date/Time   CHOL 129 09/22/2020 0303   TRIG 89 09/22/2020 0303   HDL 28 (L) 09/22/2020 0303   CHOLHDL 4.6 09/22/2020 0303   VLDL 18 09/22/2020 0303   LDLCALC 83 09/22/2020 0303      Wt Readings from Last 3 Encounters:  12/02/20 213 lb 3.2 oz (96.7 kg)  10/10/20 215 lb (97.5 kg)  09/21/20 250 lb (113.4 kg)      Other studies Reviewed: Additional studies/ records that were reviewed today include: Hospital records. Review of the above records demonstrates:  Please see elsewhere in the note.     ASSESSMENT AND PLAN:  CAD:  The patient has no new sypmtoms.  No further cardiovascular testing is indicated.  We will continue with aggressive risk reduction and meds as listed.  I am going to continue DAPT given the fact that he has had stenosis and restenosis.  We talked about this.  DYSLIPIDEMIA: LDL was 83.  We talked about diet and I suggested he catch more fish.   HYPERTENSION:The blood pressure is at target. No change in medications is indicated. We will continue with therapeutic lifestyle changes (TLC).  PAF:    He is not having any symptomatic paroxysms.  No change in therapy.  He tolerates anticoagulation.  I requested that he get CBC the next time he sees his primary doctor    Current medicines are reviewed at length with the patient today.  The patient does not have concerns regarding medicines.  The following changes have been made:   None  Labs/ tests ordered today include:   None  No orders of the defined types were placed in this encounter.    Disposition:   FU with me in one year.     Signed, Minus Breeding, MD  12/02/2020 2:14 PM    [facility]

## 2020-12-02 ENCOUNTER — Encounter: Payer: Self-pay | Admitting: Cardiology

## 2020-12-02 ENCOUNTER — Other Ambulatory Visit: Payer: Self-pay

## 2020-12-02 ENCOUNTER — Ambulatory Visit (INDEPENDENT_AMBULATORY_CARE_PROVIDER_SITE_OTHER): Payer: Medicare Other | Admitting: Cardiology

## 2020-12-02 VITALS — BP 134/78 | HR 60 | Ht 70.0 in | Wt 213.2 lb

## 2020-12-02 DIAGNOSIS — E785 Hyperlipidemia, unspecified: Secondary | ICD-10-CM | POA: Diagnosis not present

## 2020-12-02 DIAGNOSIS — I2510 Atherosclerotic heart disease of native coronary artery without angina pectoris: Secondary | ICD-10-CM

## 2020-12-02 DIAGNOSIS — I480 Paroxysmal atrial fibrillation: Secondary | ICD-10-CM | POA: Diagnosis not present

## 2020-12-02 DIAGNOSIS — R1310 Dysphagia, unspecified: Secondary | ICD-10-CM

## 2020-12-02 DIAGNOSIS — I10 Essential (primary) hypertension: Secondary | ICD-10-CM

## 2020-12-02 NOTE — Patient Instructions (Signed)
Medication Instructions:  Continue current medications  *If you need a refill on your cardiac medications before your next appointment, please call your pharmacy*   Lab Work: None Ordered  Testing/Procedures: None Ordered   Follow-Up: At [facility], you and your health needs are our priority.  As part of our continuing mission to provide you with exceptional heart care, we have created designated Provider Care Teams.  These Care Teams include your primary Cardiologist (physician) and Advanced Practice Providers (APPs -  Physician Assistants and Nurse Practitioners) who all work together to provide you with the care you need, when you need it.  We recommend signing up for the patient portal called "MyChart".  Sign up information is provided on this After Visit Summary.  MyChart is used to connect with patients for Virtual Visits (Telemedicine).  Patients are able to view lab/test results, encounter notes, upcoming appointments, etc.  Non-urgent messages can be sent to your provider as well.   To learn more about what you can do with MyChart, go to [email or web address].    Your next appointment:   1 year(s)  The format for your next appointment:   In Person  Provider:   You may see Minus Breeding, MD or one of the following Advanced Practice Providers on your designated Care Team:    Rosaria Ferries, PA-C  Jory Sims, DNP, ANP   OTHER:  [employer]

## 2021-02-02 ENCOUNTER — Other Ambulatory Visit: Payer: Self-pay | Admitting: Cardiology

## 2021-03-30 ENCOUNTER — Emergency Department (HOSPITAL_COMMUNITY): Payer: Medicare Other

## 2021-03-30 ENCOUNTER — Encounter (HOSPITAL_COMMUNITY): Payer: Self-pay | Admitting: Emergency Medicine

## 2021-03-30 ENCOUNTER — Other Ambulatory Visit: Payer: Self-pay

## 2021-03-30 ENCOUNTER — Observation Stay (HOSPITAL_COMMUNITY)
Admission: EM | Admit: 2021-03-30 | Discharge: 2021-03-31 | Disposition: A | Discharge status: Home / Self Care | Payer: Medicare Other | Attending: Family Medicine | Admitting: Family Medicine

## 2021-03-30 DIAGNOSIS — E119 Type 2 diabetes mellitus without complications: Secondary | ICD-10-CM | POA: Diagnosis not present

## 2021-03-30 DIAGNOSIS — E782 Mixed hyperlipidemia: Secondary | ICD-10-CM

## 2021-03-30 DIAGNOSIS — Z20822 Contact with and (suspected) exposure to covid-19: Secondary | ICD-10-CM | POA: Insufficient documentation

## 2021-03-30 DIAGNOSIS — E66811 Obesity, class 1: Secondary | ICD-10-CM | POA: Diagnosis present

## 2021-03-30 DIAGNOSIS — Z7901 Long term (current) use of anticoagulants: Secondary | ICD-10-CM | POA: Insufficient documentation

## 2021-03-30 DIAGNOSIS — Z87891 Personal history of nicotine dependence: Secondary | ICD-10-CM | POA: Insufficient documentation

## 2021-03-30 DIAGNOSIS — I10 Essential (primary) hypertension: Secondary | ICD-10-CM | POA: Diagnosis present

## 2021-03-30 DIAGNOSIS — R072 Precordial pain: Principal | ICD-10-CM | POA: Insufficient documentation

## 2021-03-30 DIAGNOSIS — R079 Chest pain, unspecified: Secondary | ICD-10-CM | POA: Diagnosis present

## 2021-03-30 DIAGNOSIS — E039 Hypothyroidism, unspecified: Secondary | ICD-10-CM

## 2021-03-30 DIAGNOSIS — Z7902 Long term (current) use of antithrombotics/antiplatelets: Secondary | ICD-10-CM | POA: Insufficient documentation

## 2021-03-30 DIAGNOSIS — Z79899 Other long term (current) drug therapy: Secondary | ICD-10-CM | POA: Insufficient documentation

## 2021-03-30 DIAGNOSIS — I480 Paroxysmal atrial fibrillation: Secondary | ICD-10-CM | POA: Diagnosis not present

## 2021-03-30 DIAGNOSIS — Z9861 Coronary angioplasty status: Secondary | ICD-10-CM | POA: Insufficient documentation

## 2021-03-30 DIAGNOSIS — E038 Other specified hypothyroidism: Secondary | ICD-10-CM | POA: Diagnosis present

## 2021-03-30 DIAGNOSIS — I4891 Unspecified atrial fibrillation: Secondary | ICD-10-CM | POA: Diagnosis present

## 2021-03-30 DIAGNOSIS — D72829 Elevated white blood cell count, unspecified: Secondary | ICD-10-CM | POA: Diagnosis present

## 2021-03-30 DIAGNOSIS — R0789 Other chest pain: Secondary | ICD-10-CM | POA: Diagnosis present

## 2021-03-30 DIAGNOSIS — I2510 Atherosclerotic heart disease of native coronary artery without angina pectoris: Secondary | ICD-10-CM | POA: Diagnosis not present

## 2021-03-30 DIAGNOSIS — R739 Hyperglycemia, unspecified: Secondary | ICD-10-CM | POA: Diagnosis present

## 2021-03-30 DIAGNOSIS — Z85850 Personal history of malignant neoplasm of thyroid: Secondary | ICD-10-CM | POA: Insufficient documentation

## 2021-03-30 DIAGNOSIS — E876 Hypokalemia: Secondary | ICD-10-CM | POA: Diagnosis present

## 2021-03-30 DIAGNOSIS — E785 Hyperlipidemia, unspecified: Secondary | ICD-10-CM | POA: Diagnosis present

## 2021-03-30 DIAGNOSIS — E669 Obesity, unspecified: Secondary | ICD-10-CM

## 2021-03-30 LAB — TSH: TSH: 8.649 u[IU]/mL — ABNORMAL HIGH (ref 0.350–4.500)

## 2021-03-30 LAB — CBC
HCT: 42.9 % (ref 39.0–52.0)
Hemoglobin: 14.9 g/dL (ref 13.0–17.0)
MCH: 31.6 pg (ref 26.0–34.0)
MCHC: 34.7 g/dL (ref 30.0–36.0)
MCV: 91.1 fL (ref 80.0–100.0)
Platelets: 242 10*3/uL (ref 150–400)
RBC: 4.71 MIL/uL (ref 4.22–5.81)
RDW: 12.1 % (ref 11.5–15.5)
WBC: 11.3 10*3/uL — ABNORMAL HIGH (ref 4.0–10.5)
nRBC: 0 % (ref 0.0–0.2)

## 2021-03-30 LAB — RESP PANEL BY RT-PCR (FLU A&B, COVID) ARPGX2
Influenza A by PCR: NEGATIVE
Influenza B by PCR: NEGATIVE
SARS Coronavirus 2 by RT PCR: NEGATIVE

## 2021-03-30 LAB — HEPATIC FUNCTION PANEL
ALT: 25 U/L (ref 0–44)
AST: 18 U/L (ref 15–41)
Albumin: 4 g/dL (ref 3.5–5.0)
Alkaline Phosphatase: 73 U/L (ref 38–126)
Bilirubin, Direct: 0.2 mg/dL (ref 0.0–0.2)
Indirect Bilirubin: 0.9 mg/dL (ref 0.3–0.9)
Total Bilirubin: 1.1 mg/dL (ref 0.3–1.2)
Total Protein: 7.1 g/dL (ref 6.5–8.1)

## 2021-03-30 LAB — LIPASE, BLOOD: Lipase: 32 U/L (ref 11–51)

## 2021-03-30 LAB — BASIC METABOLIC PANEL
Anion gap: 9 (ref 5–15)
BUN: 16 mg/dL (ref 6–20)
CO2: 27 mmol/L (ref 22–32)
Calcium: 8.6 mg/dL — ABNORMAL LOW (ref 8.9–10.3)
Chloride: 106 mmol/L (ref 98–111)
Creatinine, Ser: 0.89 mg/dL (ref 0.61–1.24)
GFR, Estimated: >60 mL/min (ref >60)
Glucose, Bld: 150 mg/dL — ABNORMAL HIGH (ref 70–99)
Potassium: 2.9 mmol/L — ABNORMAL LOW (ref 3.5–5.1)
Sodium: 142 mmol/L (ref 135–145)

## 2021-03-30 LAB — MAGNESIUM: Magnesium: 1.7 mg/dL (ref 1.7–2.4)

## 2021-03-30 LAB — TROPONIN I (HIGH SENSITIVITY)
Troponin I (High Sensitivity): 7 ng/L (ref <18)
Troponin I (High Sensitivity): 7 ng/L (ref <18)

## 2021-03-30 LAB — PHOSPHORUS: Phosphorus: 1.2 mg/dL — ABNORMAL LOW (ref 2.5–4.6)

## 2021-03-30 MED ORDER — ONDANSETRON HCL 4 MG/2ML IJ SOLN
4.0000 mg | Freq: Once | INTRAMUSCULAR | Status: AC
  Administered 2021-03-30: 4 mg via INTRAVENOUS
  Filled 2021-03-30: qty 2

## 2021-03-30 MED ORDER — MAGNESIUM SULFATE 2 GM/50ML IV SOLN
2.0000 g | Freq: Once | INTRAVENOUS | Status: AC
  Administered 2021-03-31: 2 g via INTRAVENOUS
  Filled 2021-03-30: qty 50

## 2021-03-30 MED ORDER — K PHOS MONO-SOD PHOS DI & MONO 155-852-130 MG PO TABS
500.0000 mg | ORAL_TABLET | Freq: Once | ORAL | Status: AC
  Administered 2021-03-31: 500 mg via ORAL
  Filled 2021-03-30: qty 2

## 2021-03-30 MED ORDER — MORPHINE SULFATE (PF) 4 MG/ML IV SOLN
4.0000 mg | Freq: Once | INTRAVENOUS | Status: AC
  Administered 2021-03-30: 4 mg via INTRAVENOUS
  Filled 2021-03-30: qty 1

## 2021-03-30 MED ORDER — POTASSIUM CHLORIDE 10 MEQ/100ML IV SOLN
10.0000 meq | INTRAVENOUS | Status: AC
  Administered 2021-03-30 – 2021-03-31 (×3): 10 meq via INTRAVENOUS
  Filled 2021-03-30 (×3): qty 100

## 2021-03-30 NOTE — ED Provider Notes (Signed)
Emergency Department Provider Note   I have reviewed the triage vital signs and the nursing notes.   HISTORY  Chief Complaint Jaw Pain   HPI William Mcmahon is a 60 y.o. male with PMH of CAD, PAF on anticoagulation, DM, HLD, and thyroid cancer s/p thyroidectomy presents to the ED with acute onset CP radiating to the throat and jaw. Patient reports similar pain with CAD in the past. Last cath was 2020 which showed an occluded stent but revascularization wasn't possible. He has been compliant with medications and doing well overall. No fever, chills, cough, or SOB. He took Nitro when pain began suddenly today with no relief. EMS arrived on scene with reported HR in the 170s and gave diltiazem for what appeared to be A fib with RVR. Symptoms improved with rate reduction. Continues to have mild jaw pain here but overall improved. No active CP.    Past Medical History:  Diagnosis Date   CAD in native artery, with prior stents, and instent restenosis of small vessel, medical therapy, LAD and RCA stents are patent 09/12/2018   Cancer ([place])    thyroid   DM (diabetes mellitus), type 2 ([place]) diet controlled 09/12/2018   HLD (hyperlipidemia) 09/12/2018   Hyperlipidemia    Hypertension    PAF (paroxysmal atrial fibrillation) ([place])    Syncope- may be due to diuretics 09/12/2018   Thyroid disease     Patient Active Problem List   Diagnosis Date Noted   Leukocytosis 03/31/2021   Hypophosphatemia 03/31/2021   Hypothyroidism 03/31/2021   Hyperglycemia 03/31/2021   Obesity (BMI 30.0-34.9) 03/31/2021   Atypical chest pain 03/30/2021   PAF (paroxysmal atrial fibrillation) ([place]) 12/01/2020   Dysphagia 12/01/2020   Demand ischemia ([place]) 09/22/2020   Atrial fibrillation with RVR ([place]) 09/21/2020   Educated about COVID-19 virus infection 02/07/2019   Essential hypertension 11/29/2018   Palpitations 11/29/2018   SOB (shortness of breath) 11/29/2018   CAD in native artery, with prior stents, and  instent restenosis of small vessel, medical therapy, LAD and RCA stents are patent 09/12/2018   HLD (hyperlipidemia) 09/12/2018   DM (diabetes mellitus), type 2 ([place]) diet controlled 09/12/2018   Hypokalemia 09/12/2018   Syncope- may be due to diuretics 09/12/2018   Unstable angina ([place]) 09/10/2018    Past Surgical History:  Procedure Laterality Date   CARDIAC SURGERY     stents   CHOLECYSTECTOMY     LEFT HEART CATH AND CORONARY ANGIOGRAPHY N/A 09/11/2018   Procedure: LEFT HEART CATH AND CORONARY ANGIOGRAPHY;  Surgeon: Leonie Man, MD;  Location: [facility];  Service: Cardiovascular;  Laterality: N/A;    Allergies Naproxen  Family History  Problem Relation Age of Onset   CAD Father        s/p stent and CABG   Heart attack Maternal Grandfather     Social History Social History   Tobacco Use   Smoking status: Former   Smokeless tobacco: Never  Substance Use Topics   Alcohol use: Never   Drug use: Never    Review of Systems  Constitutional: No fever/chills Eyes: No visual changes. ENT: No sore throat. Cardiovascular: Positive chest pain radiating to the jaw.  Respiratory: Denies shortness of breath. Gastrointestinal: No abdominal pain.  No nausea, no vomiting.  No diarrhea.  No constipation. Genitourinary: Negative for dysuria. Musculoskeletal: Negative for back pain. Skin: Negative for rash. Neurological: Negative for headaches, focal weakness or numbness.  10-point ROS otherwise negative.  ____________________________________________   PHYSICAL  EXAM:  VITAL SIGNS: ED Triage Vitals  Enc Vitals Group     BP 03/30/21 1852 132/80     Pulse Rate 03/30/21 1852 (!) 103     Resp 03/30/21 1852 (!) 24     Temp 03/30/21 1852 97.9 F (36.6 C)     Temp Source 03/30/21 1852 Oral     SpO2 03/30/21 1852 97 %     Weight 03/30/21 1850 211 lb (95.7 kg)     Height 03/30/21 1850 '5\' 10"'$  (1.778 m)   Constitutional: Alert and oriented. Well appearing and in  no acute distress. Eyes: Conjunctivae are normal.  Head: Atraumatic. Nose: No congestion/rhinnorhea. Mouth/Throat: Mucous membranes are moist.  Neck: No stridor.  Cardiovascular: Normal rate, regular rhythm. Good peripheral circulation. Grossly normal heart sounds.   Respiratory: Normal respiratory effort.  No retractions. Lungs CTAB. Gastrointestinal: Soft and nontender. No distention.  Musculoskeletal: No lower extremity tenderness nor edema. No gross deformities of extremities. Neurologic:  Normal speech and language. No gross focal neurologic deficits are appreciated.  Skin:  Skin is warm, dry and intact. No rash noted.  ____________________________________________   LABS (all labs ordered are listed, but only abnormal results are displayed)  Labs Reviewed  BASIC METABOLIC PANEL - Abnormal; Notable for the following components:      Result Value   Potassium 2.9 (*)    Glucose, Bld 150 (*)    Calcium 8.6 (*)    All other components within normal limits  CBC - Abnormal; Notable for the following components:   WBC 11.3 (*)    All other components within normal limits  PHOSPHORUS - Abnormal; Notable for the following components:   Phosphorus 1.2 (*)    All other components within normal limits  TSH - Abnormal; Notable for the following components:   TSH 8.649 (*)    All other components within normal limits  RESP PANEL BY RT-PCR (FLU A&B, COVID) ARPGX2  HEPATIC FUNCTION PANEL  LIPASE, BLOOD  MAGNESIUM  HIV ANTIBODY (ROUTINE TESTING W REFLEX)  COMPREHENSIVE METABOLIC PANEL  CBC  PROTIME-INR  APTT  MAGNESIUM  PHOSPHORUS  TROPONIN I (HIGH SENSITIVITY)  TROPONIN I (HIGH SENSITIVITY)   ____________________________________________  EKG   EKG Interpretation  Date/Time:  Monday March 30 2021 18:50:13 EDT Ventricular Rate:  104 PR Interval:    QRS Duration: 162 QT Interval:  398 QTC Calculation: 503 R Axis:   236 Text Interpretation: Atrial fibrillation Multiple  ventricular premature complexes Right bundle branch block RBBB new from prior Confirmed by Nanda Quinton [phone]) on 03/30/2021 6:55:26 PM        ____________________________________________  RADIOLOGY  DG Chest 2 View  Result Date: 03/30/2021 CLINICAL DATA:  Chest pain EXAM: CHEST - 2 VIEW COMPARISON:  09/21/2020 FINDINGS: The heart size and mediastinal contours are within normal limits. Atherosclerotic calcification of the aortic knob. No focal airspace consolidation, pleural effusion, or pneumothorax. The visualized skeletal structures are unremarkable. IMPRESSION: No active cardiopulmonary disease. Electronically Signed   By: Davina Poke D.O.   On: 03/30/2021 20:08    ____________________________________________   PROCEDURES  Procedure(s) performed:   Procedures  CRITICAL CARE Performed by: Margette Fast Total critical care time: 35 minutes Critical care time was exclusive of separately billable procedures and treating other patients. Critical care was necessary to treat or prevent imminent or life-threatening deterioration. Critical care was time spent personally by me on the following activities: development of treatment plan with patient and/or surrogate as well as nursing, discussions with  consultants, evaluation of patient's response to treatment, examination of patient, obtaining history from patient or surrogate, ordering and performing treatments and interventions, ordering and review of laboratory studies, ordering and review of radiographic studies, pulse oximetry and re-evaluation of patient's condition.  Nanda Quinton, MD Emergency Medicine  ____________________________________________   INITIAL IMPRESSION / ASSESSMENT AND PLAN / ED COURSE  Pertinent labs & imaging results that were available during my care of the patient were reviewed by me and considered in my medical decision making (see chart for details).   Patient presents to the emergency department with  acute onset chest pain radiating to the jaw which reminds him of his ACS pain.  Reviewed his last heart cath from 2020.  Arrives in A. fib with some right axis deviation.  There.  Be multiple PVCs on his initial EKG.  While I was in the room evaluating him he would have several runs of 3-4 beat NSVT. No symptoms with these. BP is normal. Plan for labs including troponin and TSH.   Troponin normal x 1. Patient has converted to NSR without additional intervention. CXR and labs reviewed. Discussed case with Cardiology fellow. Suspect symptomatic a-fib with angina related to increased HR. Plan to observe overnight and trend troponin. Can consult Cards locally in the AM. PRN.   Discussed patient's case with [facility] to request admission. Patient and family (if present) updated with plan. Care transferred to [facility] service.  I reviewed all nursing notes, vitals, pertinent old records, EKGs, labs, imaging (as available).  ____________________________________________  FINAL CLINICAL IMPRESSION(S) / ED DIAGNOSES  Final diagnoses:  Atrial fibrillation with RVR (HCC)  Precordial chest pain     MEDICATIONS GIVEN DURING THIS VISIT:  Medications  potassium chloride 10 mEq in 100 mL IVPB (10 mEq Intravenous New Bag/Given 03/31/21 0028)  magnesium sulfate IVPB 2 g 50 mL (2 g Intravenous New Bag/Given 03/31/21 0030)  morphine 4 MG/ML injection 4 mg (4 mg Intravenous Given 03/30/21 1925)  ondansetron (ZOFRAN) injection 4 mg (4 mg Intravenous Given 03/30/21 1924)  phosphorus (K PHOS NEUTRAL) tablet 500 mg (500 mg Oral Given 03/31/21 0030)      Note:  This document was prepared using Dragon voice recognition software and may include unintentional dictation errors.  Nanda Quinton, MD, [facility] Emergency Medicine    Belkys Henault, [facility], MD 03/31/21 [phone]

## 2021-03-30 NOTE — H&P (Signed)
History and Physical  William Mcmahon [id number] DOB: 1961/02/18 DOA: 03/30/2021  Referring physician: Margette Fast, MD PCP: Chesley Noon, MD  Patient coming from: Home  Chief Complaint: Chest pain  HPI: William Mcmahon is a 60 y.o. male with medical history significant for CAD status post stent placement, hypothyroidism, A. fib on Eliquis, hypertension and hyperlipidemia who presents to the emergency department due to palpitations which started around 5:30 PM, the intensity of the palpitations was so much that patient felt pain in his jaw, he also complained of diaphoresis associated with nausea but without vomiting.  He states that he had similar sensation about 2 years ago (2020).  Patient states that he took a nitroglycerin without any relief.  EMS was activated and on arrival of EMS team, HR was noted to be in the 170s, it was thought to be in A. fib with RVR, so diltiazem was given with improved rate reduction.  Patient states that the palpitations have resolved and that he has been back to his normal baseline.  ED Course:  In the emergency department, he was bradycardic with HR in high 40s and 50s, but other vital signs were within normal range.  Work-up in the ER showed mild leukocytosis, hypokalemia, hyperglycemia, troponin x2 was flat at 7.  Magnesium 1.7, lipase 32, TSH 8.649.  Influenza A, B, SARS coronavirus 2 was negative. Chest x-ray showed no active cardiopulmonary disease IV morphine was given, phosphorus, potassium and magnesium were replenished.  Zofran was given.  Hospitalist was asked to admit patient for further evaluation and management.  Review of Systems: Constitutional: Negative for chills and fever.  HENT: Positive for jaw pain due to palpitations in the chest.  Negative for ear pain and sore throat.   Eyes: Negative for pain and visual disturbance.  Respiratory: Negative for cough, chest tightness and shortness of breath.   Cardiovascular: Positive for  palpitations and diaphoresis.  Negative for chest pain  Gastrointestinal: Positive for nausea.  Negative for abdominal pain and vomiting.  Endocrine: Negative for polyphagia and polyuria.  Genitourinary: Negative for decreased urine volume, dysuria, enuresis Musculoskeletal: Negative for arthralgias and back pain.  Skin: Negative for color change and rash.  Allergic/Immunologic: Negative for immunocompromised state.  Neurological: Negative for tremors, syncope, speech difficulty, weakness, light-headedness and headaches.  Hematological: Does not bruise/bleed easily.  All other systems reviewed and are negative  Past Medical History:  Diagnosis Date   CAD in native artery, with prior stents, and instent restenosis of small vessel, medical therapy, LAD and RCA stents are patent 09/12/2018   Cancer ([place])    thyroid   DM (diabetes mellitus), type 2 ([place]) diet controlled 09/12/2018   HLD (hyperlipidemia) 09/12/2018   Hyperlipidemia    Hypertension    PAF (paroxysmal atrial fibrillation) ([place])    Syncope- may be due to diuretics 09/12/2018   Thyroid disease    Past Surgical History:  Procedure Laterality Date   CARDIAC SURGERY     stents   CHOLECYSTECTOMY     LEFT HEART CATH AND CORONARY ANGIOGRAPHY N/A 09/11/2018   Procedure: LEFT HEART CATH AND CORONARY ANGIOGRAPHY;  Surgeon: Leonie Man, MD;  Location: [facility];  Service: Cardiovascular;  Laterality: N/A;    Social History:  reports that he has quit smoking. He has never used smokeless tobacco. He reports that he does not drink alcohol and does not use drugs.   Allergies  Allergen Reactions   Naproxen Swelling    Family History  Problem Relation Age of Onset   CAD Father        s/p stent and CABG   Heart attack Maternal Grandfather      Prior to Admission medications   Medication Sig Start Date End Date Taking? Authorizing Provider  amLODipine (NORVASC) 10 MG tablet Take 1 tablet by mouth once daily 02/02/21   Yes Hochrein, Jeneen Rinks, MD  apixaban (ELIQUIS) 5 MG TABS tablet Take 1 tablet (5 mg total) by mouth 2 (two) times daily. 09/22/20  Yes Almyra Deforest, PA  atorvastatin (LIPITOR) 80 MG tablet Take 1 tablet by mouth once daily 03/13/20  Yes Hochrein, Jeneen Rinks, MD  carvedilol (COREG) 12.5 MG tablet TAKE 1 TABLET BY MOUTH TWICE DAILY WITH MEAL(S) Patient taking differently: Take 12.5 mg by mouth 2 (two) times daily with a meal. 02/02/21  Yes Minus Breeding, MD  clopidogrel (PLAVIX) 75 MG tablet Take 1 tablet by mouth once daily 02/02/21  Yes Minus Breeding, MD  isosorbide mononitrate (IMDUR) 60 MG 24 hr tablet Take 1 tablet by mouth once daily 06/02/20  Yes Hochrein, Jeneen Rinks, MD  levothyroxine (SYNTHROID) 137 MCG tablet Take 137 mcg by mouth daily. 08/01/20  Yes [provider]  lidocaine (XYLOCAINE) 1 % (with preservative) injection Inject into the muscle. 03/18/21  Yes [provider]  nitroGLYCERIN (NITROSTAT) 0.4 MG SL tablet Place 1 tablet (0.4 mg total) under the tongue every 5 (five) minutes as needed for chest pain. 09/25/18  Yes Lendon Colonel, NP  potassium chloride (KLOR-CON) 10 MEQ tablet Take 1 tablet (10 mEq total) by mouth daily. 09/22/20  Yes Almyra Deforest, PA  triamcinolone acetonide (KENALOG-40) 40 MG/ML injection (RADIOLOGY ONLY) Inject into the articular space. 03/18/21  Yes [provider]    Physical Exam: BP 132/77 (BP Location: Right Arm)   Pulse (!) 57   Temp 97.9 F (36.6 C) (Oral)   Resp 18   Ht '5\' 10"'$  (1.778 m)   Wt 95.7 kg   SpO2 99%   BMI 30.28 kg/m   General: 60 y.o. year-old male well developed well nourished in no acute distress.  Alert and oriented x3. HEENT: NCAT, EOMI Neck: Supple, trachea medial Cardiovascular: Bradycardia.  Regular rate and rhythm with no rubs or gallops.  No thyromegaly or JVD noted.  2/4 pulses in all 4 extremities. Respiratory: Clear to auscultation with no wheezes or rales. Good inspiratory effort. Abdomen: Soft, nontender  nondistended with normal bowel sounds x4 quadrants. Muskuloskeletal: No cyanosis, clubbing or edema noted bilaterally Neuro: CN II-XII intact, strength 5/5 x 4, sensation, reflexes intact Skin: No ulcerative lesions noted or rashes Psychiatry: Judgement and insight appear normal. Mood is appropriate for condition and setting          Labs on Admission:  Basic Metabolic Panel: Recent Labs  Lab 03/30/21 1910 03/30/21 1911  NA 142  --   K 2.9*  --   CL 106  --   CO2 27  --   GLUCOSE 150*  --   BUN 16  --   CREATININE 0.89  --   CALCIUM 8.6*  --   MG 1.7  --   PHOS  --  1.2*   Liver Function Tests: Recent Labs  Lab 03/30/21 1910  AST 18  ALT 25  ALKPHOS 73  BILITOT 1.1  PROT 7.1  ALBUMIN 4.0   Recent Labs  Lab 03/30/21 1910  LIPASE 32   No results for input(s): AMMONIA in the last 168 hours. CBC: Recent Labs  Lab 03/30/21 1910  WBC 11.3*  HGB 14.9  HCT 42.9  MCV 91.1  PLT 242   Cardiac Enzymes: No results for input(s): CKTOTAL, CKMB, CKMBINDEX, TROPONINI in the last 168 hours.  BNP (last 3 results) No results for input(s): BNP in the last 8760 hours.  ProBNP (last 3 results) No results for input(s): PROBNP in the last 8760 hours.  CBG: No results for input(s): GLUCAP in the last 168 hours.  Radiological Exams on Admission: DG Chest 2 View  Result Date: 03/30/2021 CLINICAL DATA:  Chest pain EXAM: CHEST - 2 VIEW COMPARISON:  09/21/2020 FINDINGS: The heart size and mediastinal contours are within normal limits. Atherosclerotic calcification of the aortic knob. No focal airspace consolidation, pleural effusion, or pneumothorax. The visualized skeletal structures are unremarkable. IMPRESSION: No active cardiopulmonary disease. Electronically Signed   By: Davina Poke D.O.   On: 03/30/2021 20:08    EKG: I independently viewed the EKG done and my findings are as followed: A. fib with RVR  Assessment/Plan Present on Admission:  Hypokalemia  CAD in  native artery, with prior stents, and instent restenosis of small vessel, medical therapy, LAD and RCA stents are patent  HLD (hyperlipidemia)  Essential hypertension  Atrial fibrillation with RVR ([place])  Active Problems:   CAD in native artery, with prior stents, and instent restenosis of small vessel, medical therapy, LAD and RCA stents are patent   HLD (hyperlipidemia)   Hypokalemia   Essential hypertension   Atrial fibrillation with RVR ([place])   Atypical chest pain   Leukocytosis   Hypophosphatemia   Hypothyroidism   Hyperglycemia   Obesity (BMI 30.0-34.9)   Atypical chest pain Patient denies typical chest pain, he endorsed palpitations with radiation into the jaws causing jaw pain This has since resolved Troponin x2 was flat at 7 Continue telemetry Patient states that he has since been back to his normal baseline Patient may need to follow-up with outpatient cardiology  Paroxysmal atrial fibrillation-resolved IV Cardizem was given by EMS team Continue home Coreg and Eliquis  Leukocytosis possibly reactive WBC 11.3, continue to monitor  Electrolyte abnormalities K+ is 2.9; this was replenished Phosphorus 1.2, this was replenished Please monitor for AM K+ and phosphorus for further replenishmemnt  Hyperglycemia possibly reactive CBG 150, no diabetic medication noted in patient's med rec Continue to monitor blood glucose level  Essential hypertension (controlled) Continue Norvasc, Coreg  Hyperlipidemia Continue Lipitor  CAD Continue Eliquis Plavix, Imdur, nitroglycerin as needed  Hypothyroidism TSH 8.649; free T4 will be checked Continue Synthroid  Obesity (BMI 30.28) Patient advised on diet and lifestyle modification  DVT prophylaxis: Eliquis  Code Status: Full code  Family Communication: None at bedside  Disposition Plan:  Patient is from:                        home Anticipated DC to:                   SNF or family members home Anticipated DC  date:               2-3 days Anticipated DC barriers:          Patient requires inpatient management due to atypical chest pain    Consults called: None  Admission status: Observation    Bernadette Hoit MD [facility]  03/31/2021, 12:52 AM

## 2021-03-30 NOTE — ED Triage Notes (Signed)
Pt arrives via [facility] from home. Pt states he began having jaw pain about 1730 this afternoon. Pts wife put nitro paste on his chest prior to EMS arrival. EMS found pt to be in afib with RVR rate about 170's. Pt given '25mg'$  IV Cardizem by EMS.

## 2021-03-31 ENCOUNTER — Telehealth: Payer: Self-pay | Admitting: *Deleted

## 2021-03-31 DIAGNOSIS — E038 Other specified hypothyroidism: Secondary | ICD-10-CM | POA: Diagnosis present

## 2021-03-31 DIAGNOSIS — R0789 Other chest pain: Secondary | ICD-10-CM | POA: Diagnosis not present

## 2021-03-31 DIAGNOSIS — E669 Obesity, unspecified: Secondary | ICD-10-CM | POA: Diagnosis present

## 2021-03-31 DIAGNOSIS — R739 Hyperglycemia, unspecified: Secondary | ICD-10-CM | POA: Diagnosis present

## 2021-03-31 DIAGNOSIS — D72829 Elevated white blood cell count, unspecified: Secondary | ICD-10-CM | POA: Diagnosis present

## 2021-03-31 DIAGNOSIS — E039 Hypothyroidism, unspecified: Secondary | ICD-10-CM | POA: Diagnosis present

## 2021-03-31 LAB — PROTIME-INR
INR: 1.1 (ref 0.8–1.2)
Prothrombin Time: 13.7 seconds (ref 11.4–15.2)

## 2021-03-31 LAB — CBC
HCT: 42 % (ref 39.0–52.0)
Hemoglobin: 14 g/dL (ref 13.0–17.0)
MCH: 31.3 pg (ref 26.0–34.0)
MCHC: 33.3 g/dL (ref 30.0–36.0)
MCV: 94 fL (ref 80.0–100.0)
Platelets: 208 10*3/uL (ref 150–400)
RBC: 4.47 MIL/uL (ref 4.22–5.81)
RDW: 12.3 % (ref 11.5–15.5)
WBC: 11.2 10*3/uL — ABNORMAL HIGH (ref 4.0–10.5)
nRBC: 0 % (ref 0.0–0.2)

## 2021-03-31 LAB — COMPREHENSIVE METABOLIC PANEL
ALT: 22 U/L (ref 0–44)
AST: 12 U/L — ABNORMAL LOW (ref 15–41)
Albumin: 3.7 g/dL (ref 3.5–5.0)
Alkaline Phosphatase: 62 U/L (ref 38–126)
Anion gap: 6 (ref 5–15)
BUN: 14 mg/dL (ref 6–20)
CO2: 32 mmol/L (ref 22–32)
Calcium: 8.3 mg/dL — ABNORMAL LOW (ref 8.9–10.3)
Chloride: 106 mmol/L (ref 98–111)
Creatinine, Ser: 0.77 mg/dL (ref 0.61–1.24)
GFR, Estimated: >60 mL/min (ref >60)
Glucose, Bld: 132 mg/dL — ABNORMAL HIGH (ref 70–99)
Potassium: 3.5 mmol/L (ref 3.5–5.1)
Sodium: 144 mmol/L (ref 135–145)
Total Bilirubin: 0.9 mg/dL (ref 0.3–1.2)
Total Protein: 6.6 g/dL (ref 6.5–8.1)

## 2021-03-31 LAB — APTT: aPTT: 28 seconds (ref 24–36)

## 2021-03-31 LAB — HIV ANTIBODY (ROUTINE TESTING W REFLEX): HIV Screen 4th Generation wRfx: NONREACTIVE

## 2021-03-31 LAB — MAGNESIUM: Magnesium: 2.2 mg/dL (ref 1.7–2.4)

## 2021-03-31 LAB — PHOSPHORUS: Phosphorus: 3.7 mg/dL (ref 2.5–4.6)

## 2021-03-31 MED ORDER — ISOSORBIDE MONONITRATE ER 60 MG PO TB24
60.0000 mg | ORAL_TABLET | Freq: Every day | ORAL | Status: DC
  Administered 2021-03-31: 60 mg via ORAL
  Filled 2021-03-31: qty 1

## 2021-03-31 MED ORDER — LEVOTHYROXINE SODIUM 137 MCG PO TABS
137.0000 ug | ORAL_TABLET | Freq: Every day | ORAL | Status: DC
  Administered 2021-03-31: 137 ug via ORAL
  Filled 2021-03-31: qty 1

## 2021-03-31 MED ORDER — POTASSIUM CHLORIDE CRYS ER 20 MEQ PO TBCR
40.0000 meq | EXTENDED_RELEASE_TABLET | Freq: Once | ORAL | Status: AC
  Administered 2021-03-31: 40 meq via ORAL
  Filled 2021-03-31: qty 2

## 2021-03-31 MED ORDER — APIXABAN 5 MG PO TABS
5.0000 mg | ORAL_TABLET | Freq: Two times a day (BID) | ORAL | Status: DC
  Administered 2021-03-31: 5 mg via ORAL
  Filled 2021-03-31: qty 1

## 2021-03-31 MED ORDER — CARVEDILOL 6.25 MG PO TABS: 6.2500 mg | ORAL_TABLET | Freq: Two times a day (BID) | ORAL | 3 refills | Status: DC

## 2021-03-31 MED ORDER — AMLODIPINE BESYLATE 5 MG PO TABS
10.0000 mg | ORAL_TABLET | Freq: Every day | ORAL | Status: DC
  Administered 2021-03-31: 10 mg via ORAL
  Filled 2021-03-31: qty 2

## 2021-03-31 MED ORDER — NITROGLYCERIN 0.4 MG SL SUBL: 0.4000 mg | SUBLINGUAL_TABLET | SUBLINGUAL | Status: DC | PRN

## 2021-03-31 MED ORDER — POTASSIUM CHLORIDE ER 10 MEQ PO TBCR: 10.0000 meq | EXTENDED_RELEASE_TABLET | ORAL | 5 refills | Status: DC

## 2021-03-31 MED ORDER — CARVEDILOL 12.5 MG PO TABS
12.5000 mg | ORAL_TABLET | Freq: Two times a day (BID) | ORAL | Status: DC
  Filled 2021-03-31: qty 1

## 2021-03-31 MED ORDER — POTASSIUM CHLORIDE CRYS ER 10 MEQ PO TBCR
10.0000 meq | EXTENDED_RELEASE_TABLET | Freq: Every day | ORAL | Status: DC
  Administered 2021-03-31: 10 meq via ORAL
  Filled 2021-03-31: qty 1

## 2021-03-31 MED ORDER — CLOPIDOGREL BISULFATE 75 MG PO TABS
75.0000 mg | ORAL_TABLET | Freq: Every day | ORAL | Status: DC
  Administered 2021-03-31: 75 mg via ORAL
  Filled 2021-03-31: qty 1

## 2021-03-31 MED ORDER — ATORVASTATIN CALCIUM 40 MG PO TABS
80.0000 mg | ORAL_TABLET | Freq: Every day | ORAL | Status: DC
  Administered 2021-03-31: 80 mg via ORAL
  Filled 2021-03-31: qty 2

## 2021-03-31 NOTE — Discharge Summary (Signed)
William Mcmahon, is a 60 y.o. male  DOB [birth year]  MRN [id number].  Admission date:  03/30/2021  Admitting Physician  Bernadette Hoit, DO  Discharge Date:  03/31/2021   Primary MD  Chesley Noon, MD  Recommendations for primary care physician for things to follow:   1)Your irregular heartbeat may be related to low potassium levels----please take potassium supplements as prescribed--Take 20 meq (2 Tab) on MWF and 1 tab (10 meq) on TTSS 2)follow up with a cardiologist Dr. Percival Spanish as scheduled on April 16, 2021 3) Coreg/carvedilol reduced to 6.25 mg twice a day to prevent your heart from being too slow 4) you are taking Eliquis/apixaban and Plavix/clopidogrel which are blood thinners so please Avoid ibuprofen/Advil/Aleve/Motrin/Goody Powders/Naproxen/BC powders/Meloxicam/Diclofenac/Indomethacin and other Nonsteroidal anti-inflammatory medications as these will make you more likely to bleed and can cause stomach ulcers, can also cause Kidney problems.  5) you will need a repeat BMP blood test when you see a cardiologist Dr. Percival Spanish in a couple of weeks to check your potassium levels   Admission Diagnosis  Precordial chest pain [R07.2] Atrial fibrillation with RVR ([place]) [I48.91] Chest pain [R07.9]   Discharge Diagnosis  Precordial chest pain [R07.2] Atrial fibrillation with RVR ([place]) [I48.91] Chest pain [R07.9]    Principal Problem:   Atypical chest pain Active Problems:   CAD in native artery, with prior stents, and instent restenosis of small vessel, medical therapy, LAD and RCA stents are patent   Atrial fibrillation with RVR ([place])   Essential hypertension   Hypothyroidism   HLD (hyperlipidemia)   DM (diabetes mellitus), type 2 (HCC) diet controlled   Hypokalemia   Leukocytosis   Hypophosphatemia   Hyperglycemia   Obesity (BMI 30.0-34.9)      Past Medical History:  Diagnosis Date   CAD in  native artery, with prior stents, and instent restenosis of small vessel, medical therapy, LAD and RCA stents are patent 09/12/2018   Cancer ([place])    thyroid   DM (diabetes mellitus), type 2 ([place]) diet controlled 09/12/2018   HLD (hyperlipidemia) 09/12/2018   Hyperlipidemia    Hypertension    PAF (paroxysmal atrial fibrillation) ([place])    Syncope- may be due to diuretics 09/12/2018   Thyroid disease     Past Surgical History:  Procedure Laterality Date   CARDIAC SURGERY     stents   CHOLECYSTECTOMY     LEFT HEART CATH AND CORONARY ANGIOGRAPHY N/A 09/11/2018   Procedure: LEFT HEART CATH AND CORONARY ANGIOGRAPHY;  Surgeon: Leonie Man, MD;  Location: [facility];  Service: Cardiovascular;  Laterality: N/A;       HPI  from the history and physical done on the day of admission:    Chief Complaint: Chest pain   HPI: William Mcmahon is a 60 y.o. male with medical history significant for CAD status post stent placement, hypothyroidism, A. fib on Eliquis, hypertension and hyperlipidemia who presents to the emergency department due to palpitations which started around 5:30 PM, the intensity of the palpitations was  so much that patient felt pain in his jaw, he also complained of diaphoresis associated with nausea but without vomiting.  He states that he had similar sensation about 2 years ago (2020).  Patient states that he took a nitroglycerin without any relief.  EMS was activated and on arrival of EMS team, HR was noted to be in the 170s, it was thought to be in A. fib with RVR, so diltiazem was given with improved rate reduction.  Patient states that the palpitations have resolved and that he has been back to his normal baseline.   ED Course:  In the emergency department, he was bradycardic with HR in high 40s and 50s, but other vital signs were within normal range.  Work-up in the ER showed mild leukocytosis, hypokalemia, hyperglycemia, troponin x2 was flat at 7.  Magnesium 1.7, lipase  32, TSH 8.649.  Influenza A, B, SARS coronavirus 2 was negative. Chest x-ray showed no active cardiopulmonary disease IV morphine was given, phosphorus, potassium and magnesium were replenished.  Zofran was given.  Hospitalist was asked to admit patient for further evaluation and management.     Hospital Course:    1)Atypical CP--patient with history of CAD with prior stents -Ruled out for ACS by cardiac enzymes and EKG -Remains symptom-free at this time -Patient ambulated in the hallways without any concerning symptoms, specifically no chest pains no jaw pain no shortness of breath -Patient had A. fib with RVR in the setting of hypokalemia which is similar to his previous presentations in January 2022 and back in 2019 -Patient admits to noncompliance with potassium supplementation -Continue Plavix, isosorbide, Lipitor for CAD and Eliquis for atrial fibrillation  2)Hypokalemia/hypophosphatemia--due to noncompliance, new Rx given, compliance advised -Potassium replaced and is up to 3.5 from 2.9, additional potassium given -Repeat BMP with Dr. Percival Spanish around 04/16/2021 -Phosphorus replaced  3) hypothyroidism--continue levothyroxine, TSH is 8  4)HTN--stable, Coreg reduced to 6.25 mg twice daily due to bradycardia, continue isosorbide and amlodipine  Discharge Condition: stable  Follow UP   Follow-up Information     Minus Breeding, MD Follow up on 04/16/2021.   Specialty: Cardiology Why: Cardiology Hospital Follow-up on 04/16/2021 at 2:30 PM. Contact information: [address] [place] [address] [phone]                  Consults obtained - curbside cardiology team  Diet and Activity recommendation:  As advised  Discharge Instructions    Discharge Instructions     Call MD for:  difficulty breathing, headache or visual disturbances   Complete by: As directed    Call MD for:  persistant dizziness or light-headedness   Complete by: As directed     Call MD for:  persistant nausea and vomiting   Complete by: As directed    Call MD for:  temperature >100.4   Complete by: As directed    Diet - low sodium heart healthy   Complete by: As directed    Discharge instructions   Complete by: As directed    1)Your irregular heartbeat may be related to low potassium levels----please take potassium supplements as prescribed--Take 20 meq (2 Tab) on MWF and 1 tab (10 meq) on TTSS 2)follow up with a cardiologist Dr. Percival Spanish as scheduled on April 16, 2021 3) Coreg/carvedilol reduced to 6.25 mg twice a day to prevent your heart from being too slow 4) you are taking Eliquis/apixaban and Plavix/clopidogrel which are blood thinners so please Avoid ibuprofen/Advil/Aleve/Motrin/Goody Powders/Naproxen/BC powders/Meloxicam/Diclofenac/Indomethacin and other  Nonsteroidal anti-inflammatory medications as these will make you more likely to bleed and can cause stomach ulcers, can also cause Kidney problems.  5) you will need a repeat BMP blood test when you see a cardiologist Dr. Percival Spanish in a couple of weeks to check your potassium levels   Increase activity slowly   Complete by: As directed          Discharge Medications     Allergies as of 03/31/2021       Reactions   Naproxen Swelling        Medication List     STOP taking these medications    lidocaine 1 % (with preservative) injection Commonly known as: XYLOCAINE   triamcinolone acetonide 40 MG/ML injection (RADIOLOGY ONLY) Commonly known as: KENALOG-40       TAKE these medications    amLODipine 10 MG tablet Commonly known as: NORVASC Take 1 tablet by mouth once daily   apixaban 5 MG Tabs tablet Commonly known as: ELIQUIS Take 1 tablet (5 mg total) by mouth 2 (two) times daily.   atorvastatin 80 MG tablet Commonly known as: LIPITOR Take 1 tablet by mouth once daily   carvedilol 6.25 MG tablet Commonly known as: COREG Take 1 tablet (6.25 mg total) by mouth 2 (two) times  daily with a meal. What changed:  medication strength See the new instructions.   clopidogrel 75 MG tablet Commonly known as: PLAVIX Take 1 tablet by mouth once daily   isosorbide mononitrate 60 MG 24 hr tablet Commonly known as: IMDUR Take 1 tablet by mouth once daily   levothyroxine 137 MCG tablet Commonly known as: SYNTHROID Take 137 mcg by mouth daily.   nitroGLYCERIN 0.4 MG SL tablet Commonly known as: NITROSTAT Place 1 tablet (0.4 mg total) under the tongue every 5 (five) minutes as needed for chest pain.   potassium chloride 10 MEQ tablet Commonly known as: KLOR-CON Take 1 tablet (10 mEq total) by mouth See admin instructions. Take 20 meq (2 Tab) on MWF and 1 tab (10 meq) on TTSS What changed:  when to take this additional instructions        Major procedures and Radiology Reports - PLEASE review detailed and final reports for all details, in brief -    DG Chest 2 View  Result Date: 03/30/2021 CLINICAL DATA:  Chest pain EXAM: CHEST - 2 VIEW COMPARISON:  09/21/2020 FINDINGS: The heart size and mediastinal contours are within normal limits. Atherosclerotic calcification of the aortic knob. No focal airspace consolidation, pleural effusion, or pneumothorax. The visualized skeletal structures are unremarkable. IMPRESSION: No active cardiopulmonary disease. Electronically Signed   By: Davina Poke D.O.   On: 03/30/2021 20:08    Micro Results   Recent Results (from the past 240 hour(s))  Resp Panel by RT-PCR (Flu A&B, Covid) Nasopharyngeal Swab     Status: None   Collection Time: 03/30/21  7:23 PM   Specimen: Nasopharyngeal Swab; Nasopharyngeal(NP) swabs in vial transport medium  Result Value Ref Range Status   SARS Coronavirus 2 by RT PCR NEGATIVE NEGATIVE Final    Comment: (NOTE) SARS-CoV-2 target nucleic acids are NOT DETECTED.  The SARS-CoV-2 RNA is generally detectable in upper respiratory specimens during the acute phase of infection. The  lowest concentration of SARS-CoV-2 viral copies this assay can detect is 138 copies/mL. A negative result does not preclude SARS-Cov-2 infection and should not be used as the sole basis for treatment or other patient management decisions. A negative result may occur  with  improper specimen collection/handling, submission of specimen other than nasopharyngeal swab, presence of viral mutation(s) within the areas targeted by this assay, and inadequate number of viral copies(<138 copies/mL). A negative result must be combined with clinical observations, patient history, and epidemiological information. The expected result is Negative.  Fact Sheet for Patients:  EntrepreneurPulse.com.au  Fact Sheet for Healthcare Providers:  IncredibleEmployment.be  This test is no t yet approved or cleared by the [place] FDA and  has been authorized for detection and/or diagnosis of SARS-CoV-2 by FDA under an Emergency Use Authorization (EUA). This EUA will remain  in effect (meaning this test can be used) for the duration of the COVID-19 declaration under Section 564(b)(1) of the Act, 21 U.S.C.section 360bbb-3(b)(1), unless the authorization is terminated  or revoked sooner.       Influenza A by PCR NEGATIVE NEGATIVE Final   Influenza B by PCR NEGATIVE NEGATIVE Final    Comment: (NOTE) The Xpert Xpress SARS-CoV-2/FLU/RSV plus assay is intended as an aid in the diagnosis of influenza from Nasopharyngeal swab specimens and should not be used as a sole basis for treatment. Nasal washings and aspirates are unacceptable for Xpert Xpress SARS-CoV-2/FLU/RSV testing.  Fact Sheet for Patients: EntrepreneurPulse.com.au  Fact Sheet for Healthcare Providers: IncredibleEmployment.be  This test is not yet approved or cleared by the [place] FDA and has been authorized for detection and/or diagnosis of SARS-CoV-2 by FDA under  an Emergency Use Authorization (EUA). This EUA will remain in effect (meaning this test can be used) for the duration of the COVID-19 declaration under Section 564(b)(1) of the Act, 21 U.S.C. section 360bbb-3(b)(1), unless the authorization is terminated or revoked.  Performed at [facility], [address] [place], [place] [address]        Today   [place] today has no new complaints, -Wife at bedside, ambulating hallways without chest pain palpitations dizziness chest pain or other concerns          Patient has been seen and examined prior to discharge   Objective   Blood pressure (!) 146/81, pulse (!) 53, temperature 98.2 F (36.8 C), temperature source Oral, resp. rate 18, height '5\' 10"'$  (1.778 m), weight 95.4 kg, SpO2 100 %.   Intake/Output Summary (Last 24 hours) at 03/31/2021 1111 Last data filed at 03/31/2021 0137 Gross per 24 hour  Intake 150 ml  Output 500 ml  Net -350 ml    Exam Gen:- Awake Alert, no acute distress  HEENT:- [place].AT, No sclera icterus Neck-Supple Neck,No JVD,.  Lungs-  CTAB , good air movement bilaterally  CV- S1, S2 normal, regular Abd-  +ve B.Sounds, Abd Soft, No tenderness,    Extremity/Skin:- No  edema,   good pulses Psych-affect is appropriate, oriented x3 Neuro-no new focal deficits, no tremors    Data Review   CBC w Diff:  Lab Results  Component Value Date   WBC 11.2 (H) 03/31/2021   HGB 14.0 03/31/2021   HGB 14.8 10/10/2020   HCT 42.0 03/31/2021   HCT 43.1 10/10/2020   PLT 208 03/31/2021   PLT 237 10/10/2020    CMP:  Lab Results  Component Value Date   NA 144 03/31/2021   NA 140 10/10/2020   K 3.5 03/31/2021   CL 106 03/31/2021   CO2 32 03/31/2021   BUN 14 03/31/2021   BUN 18 10/10/2020   CREATININE 0.77 03/31/2021   PROT 6.6 03/31/2021   ALBUMIN 3.7 03/31/2021   BILITOT  0.9 03/31/2021   ALKPHOS 62 03/31/2021   AST 12 (L) 03/31/2021   ALT 22 03/31/2021  .   Total Discharge time is about  33 minutes  Roxan Hockey M.D on 03/31/2021 at 11:11 AM  Go to [email or web address] -  for contact info  [facility] - Office  [phone]

## 2021-03-31 NOTE — Telephone Encounter (Signed)
   [facility] Pre-operative Risk Assessment    Patient Name: William Mcmahon  DOB: 28-Sep-1960 MRN: [id number]  Request for surgical clearance:  What type of surgery is being performed? ENDOSCOPY AND COLONOSCOPY   When is this surgery scheduled? TBD  What type of clearance is required (medical clearance vs. Pharmacy clearance to hold med vs. Both)? PHARMACY   Are there any medications that need to be held prior to surgery and how long? ELIQUIS AND PLAVIX FOR 4 DAYS   Practice name and name of physician performing surgery? [facility]   What is the office phone number? [phone]    7.   What is the office fax number? [phone]   8.   Anesthesia type (None, local, MAC, general) ?      PATIENT CURRENTLY ADMITTED

## 2021-03-31 NOTE — Discharge Instructions (Signed)
1)Your irregular heartbeat may be related to low potassium levels----please take potassium supplements as prescribed--Take 20 meq (2 Tab) on MWF and 1 tab (10 meq) on TTSS 2)follow up with a cardiologist Dr. Percival Spanish as scheduled on April 16, 2021 3) Coreg/carvedilol reduced to 6.25 mg twice a day to prevent your heart from being too slow 4) you are taking Eliquis/apixaban and Plavix/clopidogrel which are blood thinners so please Avoid ibuprofen/Advil/Aleve/Motrin/Goody Powders/Naproxen/BC powders/Meloxicam/Diclofenac/Indomethacin and other Nonsteroidal anti-inflammatory medications as these will make you more likely to bleed and can cause stomach ulcers, can also cause Kidney problems.  5) you will need a repeat BMP blood test when you see a cardiologist Dr. Percival Spanish in a couple of weeks to check your potassium levels

## 2021-03-31 NOTE — Telephone Encounter (Signed)
Patient with diagnosis of afib on Eliquis 5 mg BID for anticoagulation.    Procedure: endoscopy and colonoscopy Date of procedure: TBD  CHA2DS2-VASc Score = 3  This indicates a 3.2% annual risk of stroke. The patient's score is based upon: CHF History: No HTN History: Yes Diabetes History: Yes Stroke History: No Vascular Disease History: Yes Age Score: 0 Gender Score: 0   CrCl 139 mL/min Platelet count 208K  Per office protocol, patient can hold Eliquis for 2 days prior to procedure.    Patient should restart Eliquis on the evening of procedure or day after, at discretion of procedure MD

## 2021-03-31 NOTE — Telephone Encounter (Signed)
Patient just got released from the hospital today after being admitted for atypical chest pain and atrial fibrillation with RVR.  After rate control therapy, he also noted to have some bradycardia afterward.  He will need to be seen in the office prior to full cardiac clearance.  He has upcoming visit with Dr. Percival Spanish on 04/16/2021.  Will defer final cardiac clearance to MD.

## 2021-03-31 NOTE — Telephone Encounter (Signed)
Pt has appt 04/16/21 with Dr. Percival Spanish. Pre op clearance to be addressed at appt. Will send clearance notes to MD for upcoming appt. Will send FYI to surgeon's office pt has appt.

## 2021-03-31 NOTE — Telephone Encounter (Signed)
Clinical pharmacist to review Eliquis 

## 2021-04-15 NOTE — Progress Notes (Signed)
Cardiology Office Note   Date:  04/16/2021   ID:  William Mcmahon, DOB 02-Nov-1960, MRN [id number]  PCP:  Chesley Noon, MD  Cardiologist:   Minus Breeding, MD   Chief Complaint  Patient presents with   Atrial Fibrillation       History of Present Illness: William Mcmahon is a 60 y.o. male who presents for evaluation of coronary disease.   He has a history of CAD as listed below.  He was in the hospital recently with documented atrial fib.  He had had this previously.    He was in the hospital because of this.  He has had this before lasting about this long but he had been a few years.  He has brief episodes probably of tachypalpitations.  However on this day it started and it would not stop.  He called EMS.  It was going about 170.  I reviewed the hospital records for this visit and he did not have an elevated enzyme.  He was started on Cardizem and his heart rate slowed down any converted back to regular rhythm.  He otherwise has done well.  He says he is active.  He hunts.  He fishes.  He does not have any issues with this and has had no chest pressure, neck or arm discomfort.  He has had no shortness of breath, PND or orthopnea.  He tolerates his anticoagulation.   Past Medical History:  Diagnosis Date   CAD in native artery, with prior stents, and instent restenosis of small vessel, medical therapy, LAD and RCA stents are patent 09/12/2018   Cancer ([place])    thyroid   DM (diabetes mellitus), type 2 ([place]) diet controlled 09/12/2018   HLD (hyperlipidemia) 09/12/2018   Hyperlipidemia    Hypertension    PAF (paroxysmal atrial fibrillation) ([place])    Syncope- may be due to diuretics 09/12/2018   Thyroid disease     Past Surgical History:  Procedure Laterality Date   CARDIAC SURGERY     stents   CHOLECYSTECTOMY     LEFT HEART CATH AND CORONARY ANGIOGRAPHY N/A 09/11/2018   Procedure: LEFT HEART CATH AND CORONARY ANGIOGRAPHY;  Surgeon: Leonie Man, MD;  Location: [facility];  Service: Cardiovascular;  Laterality: N/A;     Current Outpatient Medications  Medication Sig Dispense Refill   amLODipine (NORVASC) 10 MG tablet Take 1 tablet by mouth once daily 90 tablet 3   apixaban (ELIQUIS) 5 MG TABS tablet Take 1 tablet (5 mg total) by mouth 2 (two) times daily. 180 tablet 3   atorvastatin (LIPITOR) 80 MG tablet Take 1 tablet by mouth once daily 90 tablet 3   carvedilol (COREG) 6.25 MG tablet Take 1 tablet (6.25 mg total) by mouth 2 (two) times daily with a meal. 60 tablet 3   clopidogrel (PLAVIX) 75 MG tablet Take 1 tablet by mouth once daily 90 tablet 3   isosorbide mononitrate (IMDUR) 60 MG 24 hr tablet Take 1 tablet by mouth once daily 90 tablet 1   levothyroxine (SYNTHROID) 137 MCG tablet Take 137 mcg by mouth daily.     nitroGLYCERIN (NITROSTAT) 0.4 MG SL tablet Place 1 tablet (0.4 mg total) under the tongue every 5 (five) minutes as needed for chest pain. 25 tablet 1   potassium chloride (KLOR-CON) 10 MEQ tablet Take 1 tablet (10 mEq total) by mouth See admin instructions. Take 20 meq (2 Tab) on MWF and 1 tab (10 meq) on TTSS  45 tablet 5   No current facility-administered medications for this visit.    Allergies:   Naproxen    ROS:  Please see the history of present illness.   Otherwise, review of systems are positive for none.   All other systems are reviewed and negative.    PHYSICAL EXAM: VS:  BP 140/70   Pulse (!) 57   Ht '5\' 10"'$  (1.778 m)   Wt 211 lb 9.6 oz (96 kg)   SpO2 97%   BMI 30.36 kg/m  , BMI Body mass index is 30.36 kg/m. GENERAL:  Well appearing NECK:  No jugular venous distention, waveform within normal limits, carotid upstroke brisk and symmetric, no bruits, no thyromegaly LUNGS:  Clear to auscultation bilaterally CHEST:  Unremarkable HEART:  PMI not displaced or sustained,S1 and S2 within normal limits, no S3, no S4, no clicks, no rubs, no murmurs ABD:  Flat, positive bowel sounds normal in frequency in pitch, no  bruits, no rebound, no guarding, no midline pulsatile mass, no hepatomegaly, no splenomegaly EXT:  2 plus pulses throughout, no edema, no cyanosis no clubbing    EKG:  EKG is   ordered today. Sinus rhythm, rate 67, right bundle branch block, left axis deviation, no acute ST-T wave changes   Recent Labs: 03/30/2021: TSH 8.649 03/31/2021: ALT 22; BUN 14; Creatinine, Ser 0.77; Hemoglobin 14.0; Magnesium 2.2; Platelets 208; Potassium 3.5; Sodium 144    Lipid Panel    Component Value Date/Time   CHOL 129 09/22/2020 0303   TRIG 89 09/22/2020 0303   HDL 28 (L) 09/22/2020 0303   CHOLHDL 4.6 09/22/2020 0303   VLDL 18 09/22/2020 0303   LDLCALC 83 09/22/2020 0303      Wt Readings from Last 3 Encounters:  04/16/21 211 lb 9.6 oz (96 kg)  03/31/21 210 lb 5.1 oz (95.4 kg)  12/02/20 213 lb 3.2 oz (96.7 kg)      Other studies Reviewed: Additional studies/ records that were reviewed today include: Hospital records Review of the above records demonstrates:  Please see elsewhere in the note.     ASSESSMENT AND PLAN:  CAD:   The patient has no new sypmtoms.  No further cardiovascular testing is indicated.  We will continue with aggressive risk reduction and meds as listed.  DYSLIPIDEMIA:   LDL was 83.  No change in therapy.   HYPERTENSION:   The blood pressure is at target.  No change in therapy.   PAF:    He was reduced on his carvedilol for some reason in the hospital and I am going to have him go back to 12 and half milligrams twice daily.  I do not see any mention of bradycardia arrhythmias.  He is not having any presyncope or syncope.  He has tolerated this dose for quite a while.  At this point he and I both agree that while this was a sustained episode it does not constitute a failure of rate control.  Therefore, he will continue with antiarrhythmic at this point.  He will continue meds as listed.   Current medicines are reviewed at length with the patient today.  The patient does not  have concerns regarding medicines.  The following changes have been made: As above  Labs/ tests ordered today include:   None  Orders Placed This Encounter  Procedures   EKG 12-Lead      Disposition:   FU with me in 6 months   Signed, Minus Breeding, MD  04/16/2021 5:17 PM  [facility]

## 2021-04-16 ENCOUNTER — Other Ambulatory Visit: Payer: Self-pay

## 2021-04-16 ENCOUNTER — Ambulatory Visit (INDEPENDENT_AMBULATORY_CARE_PROVIDER_SITE_OTHER): Payer: Medicare Other | Admitting: Cardiology

## 2021-04-16 ENCOUNTER — Encounter: Payer: Self-pay | Admitting: Cardiology

## 2021-04-16 VITALS — BP 140/70 | HR 57 | Ht 70.0 in | Wt 211.6 lb

## 2021-04-16 DIAGNOSIS — I480 Paroxysmal atrial fibrillation: Secondary | ICD-10-CM | POA: Diagnosis not present

## 2021-04-16 DIAGNOSIS — E785 Hyperlipidemia, unspecified: Secondary | ICD-10-CM

## 2021-04-16 DIAGNOSIS — I10 Essential (primary) hypertension: Secondary | ICD-10-CM | POA: Diagnosis not present

## 2021-04-16 NOTE — Patient Instructions (Signed)
Medication Instructions:   OKAY TO HOLD PLAVIX 7 DAYS PRIOR TO COLONOSCOPY  OKAY TO HOLD ELIQUIS 2 DAYS PRIOR TO COLONOSCOPY  TAKE ASPIRIN 17 MG ONCE DAILY WHILE HOLDING PLAVIX AND ELIQUIS  *If you need a refill on your cardiac medications before your next appointment, please call your pharmacy*   Follow-Up: At [facility], you and your health needs are our priority.  As part of our continuing mission to provide you with exceptional heart care, we have created designated Provider Care Teams.  These Care Teams include your primary Cardiologist (physician) and Advanced Practice Providers (APPs -  Physician Assistants and Nurse Practitioners) who all work together to provide you with the care you need, when you need it.  We recommend signing up for the patient portal called "MyChart".  Sign up information is provided on this After Visit Summary.  MyChart is used to connect with patients for Virtual Visits (Telemedicine).  Patients are able to view lab/test results, encounter notes, upcoming appointments, etc.  Non-urgent messages can be sent to your provider as well.   To learn more about what you can do with MyChart, go to [email or web address].    Your next appointment:   6 month(s)  The format for your next appointment:   In Person  Provider:   Minus Breeding, MD

## 2021-04-17 NOTE — Telephone Encounter (Signed)
Per Dr Rosezella [place] note: OKAY TO HOLD PLAVIX 7 DAYS PRIOR TO COLONOSCOPY   OKAY TO HOLD ELIQUIS 2 DAYS PRIOR TO COLONOSCOPY   TAKE ASPIRIN 25 MG ONCE DAILY WHILE HOLDING PLAVIX AND ELIQUIS  Forwarded note to requesting party

## 2021-04-17 NOTE — Telephone Encounter (Signed)
Received cardiac clearance request. Needs clearance for Plavix ok to, hold medications 2 to  4 days before procedure? Clearance date has been added, date of procedure is 06-17-21 per request.

## 2021-05-12 ENCOUNTER — Other Ambulatory Visit: Payer: Self-pay | Admitting: Cardiology

## 2021-05-18 ENCOUNTER — Other Ambulatory Visit: Payer: Self-pay | Admitting: *Deleted

## 2021-05-18 MED ORDER — ISOSORBIDE MONONITRATE ER 60 MG PO TB24: 60.0000 mg | ORAL_TABLET | Freq: Every day | ORAL | 3 refills | Status: DC

## 2021-06-23 ENCOUNTER — Telehealth: Payer: Self-pay | Admitting: Cardiology

## 2021-06-23 NOTE — Telephone Encounter (Signed)
*  STAT* If patient is at the pharmacy, call can be transferred to refill team.   1. Which medications need to be refilled? (please list name of each medication and dose if known) carvedilol (COREG) 12 MG tablet  2. Which pharmacy/location (including street and city if local pharmacy) is medication to be sent to? [place] [facility], [address]  3. Do they need a 30 day or 90 day supply? 90 day   Has been out of the medication for a week.

## 2021-06-24 MED ORDER — CARVEDILOL 12.5 MG PO TABS: 12.5000 mg | ORAL_TABLET | Freq: Two times a day (BID) | ORAL | 3 refills | Status: DC

## 2021-06-24 NOTE — Telephone Encounter (Signed)
Pt called again today requesting a refill on Carvedilol. He states that Dr Percival Spanish told him to increase it to the 12.5/BID. He has been out of medication for a week and this is the 3rd time he has called to get a refill. PLEASE send this Rx in to [facility] in [place] and let the pt know when this is done so he can get back on his medication.

## 2021-06-24 NOTE — Telephone Encounter (Signed)
Per last office visit note patient should be taking carvedilol 12.5 mg BID. Will send in refill.  Left message for patient letting him know that medication was sent in.

## 2021-08-05 ENCOUNTER — Telehealth: Payer: Self-pay | Admitting: Cardiology

## 2021-08-05 ENCOUNTER — Other Ambulatory Visit: Payer: Self-pay

## 2021-08-05 MED ORDER — POTASSIUM CHLORIDE ER 10 MEQ PO TBCR: 10.0000 meq | EXTENDED_RELEASE_TABLET | ORAL | 5 refills | Status: DC

## 2021-08-05 NOTE — Telephone Encounter (Signed)
*  STAT* If patient is at the pharmacy, call can be transferred to refill team.   1. Which medications need to be refilled? (please list name of each medication and dose if known)  potassium chloride (KLOR-CON) 10 MEQ tablet  2. Which pharmacy/location (including street and city if local pharmacy) is medication to be sent to? [facility], [place]  3. Do they need a 30 day or 90 day supply? 90 with refills

## 2021-08-05 NOTE — Telephone Encounter (Signed)
*  STAT* If patient is at the pharmacy, call can be transferred to refill team.   1. Which medications need to be refilled? (please list name of each medication and dose if known)  potassium chloride (KLOR-CON) 10 MEQ tablet  2. Which pharmacy/location (including street and city if local pharmacy) is medication to be sent to?  3. Do they need a 30 day or 90 day supply? 90 with refills

## 2021-08-19 ENCOUNTER — Telehealth: Payer: Self-pay | Admitting: Cardiology

## 2021-08-19 ENCOUNTER — Other Ambulatory Visit: Payer: Self-pay

## 2021-08-19 MED ORDER — CARVEDILOL 12.5 MG PO TABS: 12.5000 mg | ORAL_TABLET | Freq: Two times a day (BID) | ORAL | 1 refills | Status: DC

## 2021-08-19 NOTE — Telephone Encounter (Signed)
°*  STAT* If patient is at the pharmacy, call can be transferred to refill team.   1. Which medications need to be refilled? (please list name of each medication and dose if known) carvedilol (COREG) 12.5 MG tablet  2. Which pharmacy/location (including street and city if local pharmacy) is medication to be sent to?[place], [place] [address]  3. Do they need a 30 day or 90 day supply? 90 day

## 2021-08-19 NOTE — Telephone Encounter (Signed)
RX sent to pharmacy  

## 2021-10-17 NOTE — Progress Notes (Signed)
Cardiology Office Note   Date:  10/19/2021   ID:  William Mcmahon, DOB 01/24/1961, MRN [id number]  PCP:  Chesley Noon, MD  Cardiologist:   Minus Breeding, MD   Chief Complaint  Patient presents with   Coronary Artery Disease       History of Present Illness: William Mcmahon is a 61 y.o. male who presents for evaluation of coronary disease.   He has a history of CAD as listed below.  He was in the hospital in August 2022 with documented atrial fib.  He was treated with Cardizem.  Since I last saw him he has done better.  He has not had any further sustained tachypalpitations and does not really notice atrial fibrillation. The patient denies any new symptoms such as chest discomfort, neck or arm discomfort. There has been no new shortness of breath, PND or orthopnea. There have been no reported palpitations, presyncope or syncope.  He is hunging regularly and actually 5 years this year.    Past Medical History:  Diagnosis Date   CAD in native artery, with prior stents, and instent restenosis of small vessel, medical therapy, LAD and RCA stents are patent 09/12/2018   Cancer ([place])    thyroid   DM (diabetes mellitus), type 2 ([place]) diet controlled 09/12/2018   HLD (hyperlipidemia) 09/12/2018   Hyperlipidemia    Hypertension    PAF (paroxysmal atrial fibrillation) ([place])    Syncope- may be due to diuretics 09/12/2018   Thyroid disease     Past Surgical History:  Procedure Laterality Date   CARDIAC SURGERY     stents   CHOLECYSTECTOMY     LEFT HEART CATH AND CORONARY ANGIOGRAPHY N/A 09/11/2018   Procedure: LEFT HEART CATH AND CORONARY ANGIOGRAPHY;  Surgeon: Leonie Man, MD;  Location: [facility];  Service: Cardiovascular;  Laterality: N/A;     Current Outpatient Medications  Medication Sig Dispense Refill   amLODipine (NORVASC) 10 MG tablet Take 1 tablet by mouth once daily 90 tablet 3   apixaban (ELIQUIS) 5 MG TABS tablet Take 1 tablet (5 mg total) by mouth 2  (two) times daily. 180 tablet 3   atorvastatin (LIPITOR) 80 MG tablet Take 1 tablet by mouth once daily 90 tablet 1   carvedilol (COREG) 12.5 MG tablet Take 1 tablet (12.5 mg total) by mouth 2 (two) times daily for 9 days. 180 tablet 1   clopidogrel (PLAVIX) 75 MG tablet Take 1 tablet by mouth once daily 90 tablet 3   isosorbide mononitrate (IMDUR) 60 MG 24 hr tablet Take 1 tablet (60 mg total) by mouth daily. 90 tablet 3   levothyroxine (SYNTHROID) 137 MCG tablet Take 137 mcg by mouth daily.     nitroGLYCERIN (NITROSTAT) 0.4 MG SL tablet Place 1 tablet (0.4 mg total) under the tongue every 5 (five) minutes as needed for chest pain. 25 tablet 1   potassium chloride (KLOR-CON) 10 MEQ tablet Take 1 tablet (10 mEq total) by mouth See admin instructions. Take 20 meq (2 Tab) on MWF and 1 tab (10 meq) on TTSS 45 tablet 5   No current facility-administered medications for this visit.    Allergies:   Naproxen    ROS:  Please see the history of present illness.   Otherwise, review of systems are positive for none.   All other systems are reviewed and negative.    PHYSICAL EXAM: VS:  BP 134/86    Pulse (!) 54    Ht  5\' 10"  (1.778 m)    Wt 220 lb 9.6 oz (100.1 kg)    SpO2 96%    BMI 31.65 kg/m  , BMI Body mass index is 31.65 kg/m. GENERAL:  Well appearing NECK:  No jugular venous distention, waveform within normal limits, carotid upstroke brisk and symmetric, no bruits, no thyromegaly LUNGS:  Clear to auscultation bilaterally CHEST:  Unremarkable HEART:  PMI not displaced or sustained,S1 and S2 within normal limits, no S3, no S4, no clicks, no rubs, no murmurs ABD:  Flat, positive bowel sounds normal in frequency in pitch, no bruits, no rebound, no guarding, no midline pulsatile mass, no hepatomegaly, no splenomegaly EXT:  2 plus pulses throughout, no edema, no cyanosis no clubbing   EKG:  EKG is   ordered today. Sinus rhythm, rate 58, right bundle branch block, left axis deviation, no acute ST-T  wave changes   Recent Labs: 03/30/2021: TSH 8.649 03/31/2021: ALT 22; BUN 14; Creatinine, Ser 0.77; Hemoglobin 14.0; Magnesium 2.2; Platelets 208; Potassium 3.5; Sodium 144    Lipid Panel    Component Value Date/Time   CHOL 129 09/22/2020 0303   TRIG 89 09/22/2020 0303   HDL 28 (L) 09/22/2020 0303   CHOLHDL 4.6 09/22/2020 0303   VLDL 18 09/22/2020 0303   LDLCALC 83 09/22/2020 0303      Wt Readings from Last 3 Encounters:  10/19/21 220 lb 9.6 oz (100.1 kg)  04/16/21 211 lb 9.6 oz (96 kg)  03/31/21 210 lb 5.1 oz (95.4 kg)      Other studies Reviewed: Additional studies/ records that were reviewed today include:  Labs Review of the above records demonstrates:  Please see elsewhere in the note.     ASSESSMENT AND PLAN:  CAD:   The patient has no new sypmtoms.  No further cardiovascular testing is indicated.  We will continue with aggressive risk reduction and meds as listed.  DYSLIPIDEMIA:   LDL was 83.   I will check a lipid profile with the LDL being 50  HYPERTENSION:   The blood pressure is at target.  No change in therapy.   BRUIT:  I will order carotid Doppler   PAF:     He has had no symptomatic recurrence.  No change in therapy.  Current medicines are reviewed at length with the patient today.  The patient does not have concerns regarding medicines.  The following changes have been made: None  Labs/ tests ordered today include:     Orders Placed This Encounter  Procedures   Lipid panel   Hepatic function panel   VAS US CAROTID      Disposition:   FU with me in 12 months   Signed, Minus Breeding, MD  10/19/2021 8:18 AM    [place]

## 2021-10-19 ENCOUNTER — Encounter: Payer: Self-pay | Admitting: Cardiology

## 2021-10-19 ENCOUNTER — Other Ambulatory Visit: Payer: Self-pay

## 2021-10-19 ENCOUNTER — Ambulatory Visit (INDEPENDENT_AMBULATORY_CARE_PROVIDER_SITE_OTHER): Payer: Medicare Other | Admitting: Cardiology

## 2021-10-19 VITALS — BP 134/86 | HR 54 | Ht 70.0 in | Wt 220.6 lb

## 2021-10-19 DIAGNOSIS — E785 Hyperlipidemia, unspecified: Secondary | ICD-10-CM | POA: Diagnosis not present

## 2021-10-19 DIAGNOSIS — R0989 Other specified symptoms and signs involving the circulatory and respiratory systems: Secondary | ICD-10-CM | POA: Diagnosis not present

## 2021-10-19 LAB — LIPID PANEL
Chol/HDL Ratio: 4.1 ratio (ref 0.0–5.0)
Cholesterol, Total: 119 mg/dL (ref 100–199)
HDL: 29 mg/dL — ABNORMAL LOW (ref >39)
LDL Chol Calc (NIH): 70 mg/dL (ref 0–99)
Triglycerides: 106 mg/dL (ref 0–149)
VLDL Cholesterol Cal: 20 mg/dL (ref 5–40)

## 2021-10-19 LAB — HEPATIC FUNCTION PANEL
ALT: 34 IU/L (ref 0–44)
AST: 19 IU/L (ref 0–40)
Albumin: 4.6 g/dL (ref 3.8–4.9)
Alkaline Phosphatase: 106 IU/L (ref 44–121)
Bilirubin Total: 0.9 mg/dL (ref 0.0–1.2)
Bilirubin, Direct: 0.23 mg/dL (ref 0.00–0.40)
Total Protein: 7.2 g/dL (ref 6.0–8.5)

## 2021-10-19 NOTE — Patient Instructions (Addendum)
Medication Instructions:  Your physician recommends that you continue on your current medications as directed. Please refer to the Current Medication list given to you today.  *If you need a refill on your cardiac medications before your next appointment, please call your pharmacy*  Lab Work: Your physician recommends that you return for lab work TODAY:  Fasting Lipid Panel Liver Function Test  If you have labs (blood work) drawn today and your tests are completely normal, you will receive your results only by: MyChart Message (if you have MyChart) OR A paper copy in the mail If you have any lab test that is abnormal or we need to change your treatment, we will call you to review the results.  Testing/Procedures: Your physician has requested that you have a carotid duplex. This test is an ultrasound of the carotid arteries in your neck. It looks at blood flow through these arteries that supply the brain with blood. Allow one hour for this exam. There are no restrictions or special instructions.  Follow-Up: At [facility], you and your health needs are our priority.  As part of our continuing mission to provide you with exceptional heart care, we have created designated Provider Care Teams.  These Care Teams include your primary Cardiologist (physician) and Advanced Practice Providers (APPs -  Physician Assistants and Nurse Practitioners) who all work together to provide you with the care you need, when you need it.  Your next appointment:   1 year(s)  The format for your next appointment:   In Person  Provider:   Minus Breeding, MD    Other Instructions

## 2021-10-22 ENCOUNTER — Other Ambulatory Visit: Payer: Self-pay | Admitting: *Deleted

## 2021-10-22 DIAGNOSIS — E785 Hyperlipidemia, unspecified: Secondary | ICD-10-CM

## 2021-10-22 MED ORDER — EZETIMIBE 10 MG PO TABS: 10.0000 mg | ORAL_TABLET | Freq: Every day | ORAL | 3 refills | Status: DC

## 2021-10-23 ENCOUNTER — Ambulatory Visit (INDEPENDENT_AMBULATORY_CARE_PROVIDER_SITE_OTHER): Payer: Medicare Other

## 2021-10-23 ENCOUNTER — Other Ambulatory Visit: Payer: Self-pay

## 2021-10-23 DIAGNOSIS — R0989 Other specified symptoms and signs involving the circulatory and respiratory systems: Secondary | ICD-10-CM | POA: Diagnosis not present

## 2021-10-28 ENCOUNTER — Encounter: Payer: Self-pay | Admitting: *Deleted

## 2021-11-03 ENCOUNTER — Other Ambulatory Visit: Payer: Self-pay | Admitting: Physician Assistant

## 2021-11-03 DIAGNOSIS — I4891 Unspecified atrial fibrillation: Secondary | ICD-10-CM

## 2021-11-03 NOTE — Telephone Encounter (Signed)
Prescription refill request for Eliquis received. ?Indication: Afib  ?Last office visit:10/19/21 ([place])  ?Scr: 0.77 (03/31/21)  ?Age: 61 ?Weight: 100.1kg ? ?Appropriate dose and refill sent to requested pharmacy.  ?

## 2021-11-10 ENCOUNTER — Telehealth: Payer: Self-pay | Admitting: Cardiology

## 2021-11-10 ENCOUNTER — Other Ambulatory Visit: Payer: Self-pay | Admitting: Cardiology

## 2021-11-10 MED ORDER — AMLODIPINE BESYLATE 10 MG PO TABS: 10.0000 mg | ORAL_TABLET | Freq: Every day | ORAL | 3 refills | Status: DC

## 2021-11-10 NOTE — Telephone Encounter (Signed)
?*  STAT* If patient is at the pharmacy, call can be transferred to refill team. ? ? ?1. Which medications need to be refilled? (please list name of each medication and dose if known) amLODipine (NORVASC) 10 MG tablet ? ?2. Which pharmacy/location (including street and city if local pharmacy) is medication to be sent to? [place], [address] ? ?3. Do they need a 30 day or 90 day supply? 90 day ? ?Patient has enough for this week ?

## 2021-11-10 NOTE — Telephone Encounter (Signed)
Refill sent.

## 2021-11-16 ENCOUNTER — Other Ambulatory Visit: Payer: Self-pay

## 2021-11-16 MED ORDER — CLOPIDOGREL BISULFATE 75 MG PO TABS: 75.0000 mg | ORAL_TABLET | Freq: Every day | ORAL | 3 refills | Status: DC

## 2021-12-01 ENCOUNTER — Telehealth: Payer: Self-pay | Admitting: Cardiology

## 2021-12-01 ENCOUNTER — Other Ambulatory Visit: Payer: Self-pay

## 2021-12-01 MED ORDER — ATORVASTATIN CALCIUM 80 MG PO TABS: 80.0000 mg | ORAL_TABLET | Freq: Every day | ORAL | 3 refills | Status: DC

## 2021-12-01 NOTE — Telephone Encounter (Signed)
?*  STAT* If patient is at the pharmacy, call can be transferred to refill team. ? ? ?1. Which medications need to be refilled? (please list name of each medication and dose if known)  ?atorvastatin (LIPITOR) 80 MG tablet ? ?2. Which pharmacy/location (including street and city if local pharmacy) is medication to be sent to? [place], [address] ? ?3. Do they need a 30 day or 90 day supply? 90 day  ?

## 2021-12-01 NOTE — Telephone Encounter (Signed)
Refill sent to pharmacy.   

## 2021-12-04 ENCOUNTER — Telehealth: Payer: Self-pay | Admitting: Cardiology

## 2021-12-04 MED ORDER — ISOSORBIDE MONONITRATE ER 60 MG PO TB24: 60.0000 mg | ORAL_TABLET | Freq: Every day | ORAL | 3 refills | Status: DC

## 2021-12-04 NOTE — Telephone Encounter (Signed)
Refill sent to pharmacy.   

## 2021-12-04 NOTE — Telephone Encounter (Signed)
?*  STAT* If patient is at the pharmacy, call can be transferred to refill team. ? ? ?1. Which medications need to be refilled? (please list name of each medication and dose if known) isosorbide mononitrate (IMDUR) 60 MG 24 hr tablet ? ?2. Which pharmacy/location (including street and city if local pharmacy) is medication to be sent to? [place], [address] ? ?3. Do they need a 30 day or 90 day supply? 90 day ? ? ?

## 2022-02-01 ENCOUNTER — Telehealth: Payer: Self-pay

## 2022-02-01 NOTE — Telephone Encounter (Signed)
   Pre-operative Risk Assessment    Patient Name: William Mcmahon  DOB: [birth year] MRN: [id number]      Request for Surgical Clearance    Procedure:   Colonoscopy  Date of Surgery:  Clearance 05/05/22                                 Surgeon:  Dr. Vergia Alberts Group or Practice Name:  [facility] Phone number:  [phone] Fax number:  [phone]   Type of Clearance Requested:   - Pharmacy:  Hold Clopidogrel (Plavix) and Apixaban (Eliquis) 3   Type of Anesthesia:  General    Additional requests/questions:    SignedWonda Horner   02/01/2022, 3:39 PM

## 2022-02-02 NOTE — Telephone Encounter (Signed)
Patient with diagnosis of afib on Eliquis for anticoagulation.    Procedure: colonoscopy Date of procedure: 05/05/22  CHA2DS2-VASc Score = 3  This indicates a 3.2% annual risk of stroke. The patient's score is based upon: CHF History: 0 HTN History: 1 Diabetes History: 1 Stroke History: 0 Vascular Disease History: 1 Age Score: 0 Gender Score: 0   CrCl >151m/min Platelet count 208K  Per office protocol, patient can hold Eliquis for 1-2 days prior to procedure.

## 2022-02-02 NOTE — Telephone Encounter (Signed)
    Patient Name: William Mcmahon  DOB: August 15, 1961 MRN: [id number]  Primary Cardiologist: Minus Breeding, MD  Chart reviewed as part of pre-operative protocol coverage.   He has known CAD with prior stents to the LAD and RCA. Last cath in 08/2018 showed 99% stenosis of ostial ramus followed by 65% in-stent stenosis of prior ostial ramus stent. There was not a viable option for re-PCI so given size of ramus branch. Medical therapy was recommended.   Dr. Percival Spanish, Please provider your recommendations regarding holding Plavix.  Pharmacy to review anticoagulation.   [place], [place] 02/02/2022, 11:57 AM

## 2022-02-03 NOTE — Telephone Encounter (Signed)
    Name: William Mcmahon  DOB: 1960-11-11  MRN: [id number]  Primary Cardiologist: Minus Breeding, MD   Preoperative team, please contact this patient and set up a phone call appointment for further preoperative risk assessment. Please obtain consent and complete medication review. Thank you for your help.  I confirm that guidance regarding antiplatelet and oral anticoagulation therapy has been completed and, if necessary, noted below.  Patient with diagnosis of afib on Eliquis for anticoagulation.     Procedure: colonoscopy Date of procedure: 05/05/22   CHA2DS2-VASc Score = 3  This indicates a 3.2% annual risk of stroke. The patient's score is based upon: CHF History: 0 HTN History: 1 Diabetes History: 1 Stroke History: 0 Vascular Disease History: 1 Age Score: 0 Gender Score: 0   CrCl >138m/min Platelet count 208K   Per office protocol, patient can hold Eliquis for 1-2 days prior to procedure.  Pt also with a history of CAD. Per Dr. HPercival Spanish primary cardiologist, patient may hold Plavix for 5 days prior to procedure.   Please resume Plavix and Eliquis as soon as possible postprocedure, at the discretion of the surgeon.  ELenna Sciara NP 02/03/2022, 2:34 PM [place] [address]

## 2022-02-04 NOTE — Telephone Encounter (Signed)
Left message to call back to schedule a tele pre op appt sometime in early to mid August.

## 2022-02-05 NOTE — Telephone Encounter (Signed)
Left message for pt to call back to set up tele appt ; see previous notes.

## 2022-02-16 NOTE — Telephone Encounter (Signed)
Left message x 3 to reach the pt to set up a tele pre op appt. I will send letter to the pt to call the office for appt. I will send FYI to requesting office that we have not been able to reach the pt. I will remove from the pre op call back pool.

## 2022-02-19 ENCOUNTER — Telehealth: Payer: Self-pay | Admitting: *Deleted

## 2022-02-19 NOTE — Telephone Encounter (Signed)
Pt contacted me through [facility] CHART; Pt agreeable to tele pre op appt 04/12/22 @ 1 pm. Med rec and consent are done.

## 2022-02-19 NOTE — Telephone Encounter (Signed)
Pt agreeable to tele pre op appt 04/12/22 @ 1 pm. Med rec and consent are done.     Patient Consent for Virtual Visit        William Mcmahon has provided verbal consent on 02/19/2022 for a virtual visit (video or telephone).   CONSENT FOR VIRTUAL VISIT FOR:  William Mcmahon  By participating in this virtual visit I agree to the following:  I hereby voluntarily request, consent and authorize [facility] and its employed or contracted physicians, physician assistants, nurse practitioners or other licensed health care professionals (the Practitioner), to provide me with telemedicine health care services (the "Services") as deemed necessary by the treating Practitioner. I acknowledge and consent to receive the Services by the Practitioner via telemedicine. I understand that the telemedicine visit will involve communicating with the Practitioner through live audiovisual communication technology and the disclosure of certain medical information by electronic transmission. I acknowledge that I have been given the opportunity to request an in-person assessment or other available alternative prior to the telemedicine visit and am voluntarily participating in the telemedicine visit.  I understand that I have the right to withhold or withdraw my consent to the use of telemedicine in the course of my care at any time, without affecting my right to future care or treatment, and that the Practitioner or I may terminate the telemedicine visit at any time. I understand that I have the right to inspect all information obtained and/or recorded in the course of the telemedicine visit and may receive copies of available information for a reasonable fee.  I understand that some of the potential risks of receiving the Services via telemedicine include:  Delay or interruption in medical evaluation due to technological equipment failure or disruption; Information transmitted may not be sufficient (e.g. poor resolution of  images) to allow for appropriate medical decision making by the Practitioner; and/or  In rare instances, security protocols could fail, causing a breach of personal health information.  Furthermore, I acknowledge that it is my responsibility to provide information about my medical history, conditions and care that is complete and accurate to the best of my ability. I acknowledge that Practitioner's advice, recommendations, and/or decision may be based on factors not within their control, such as incomplete or inaccurate data provided by me or distortions of diagnostic images or specimens that may result from electronic transmissions. I understand that the practice of medicine is not an exact science and that Practitioner makes no warranties or guarantees regarding treatment outcomes. I acknowledge that a copy of this consent can be made available to me via my patient portal [facility] MyChart), or I can request a printed copy by calling the office of [facility].    I understand that my insurance will be billed for this visit.   I have read or had this consent read to me. I understand the contents of this consent, which adequately explains the benefits and risks of the Services being provided via telemedicine.  I have been provided ample opportunity to ask questions regarding this consent and the Services and have had my questions answered to my satisfaction. I give my informed consent for the services to be provided through the use of telemedicine in my medical care

## 2022-02-20 ENCOUNTER — Other Ambulatory Visit: Payer: Self-pay | Admitting: Cardiology

## 2022-03-16 ENCOUNTER — Other Ambulatory Visit: Payer: Self-pay | Admitting: Cardiology

## 2022-03-17 MED ORDER — POTASSIUM CHLORIDE ER 10 MEQ PO TBCR: EXTENDED_RELEASE_TABLET | ORAL | 0 refills | Status: DC

## 2022-03-17 NOTE — Addendum Note (Signed)
Addended by: Hinton Dyer on: 03/17/2022 04:37 PM   Modules accepted: Orders

## 2022-04-11 NOTE — Progress Notes (Signed)
Virtual Visit via Telephone Note   Because of William Mcmahon's co-morbid illnesses, he is at least at moderate risk for complications without adequate follow up.  This format is felt to be most appropriate for this patient at this time.  The patient did not have access to video technology/had technical difficulties with video requiring transitioning to audio format only (telephone).  All issues noted in this document were discussed and addressed.  No physical exam could be performed with this format.  Please refer to the patient's chart for his consent to telehealth for [facility]  Evaluation Performed:  Preoperative Cardiovascular Risk Assessment _____________   Date:  04/12/2022   Patient ID:  William Mcmahon, DOB [birth year], MRN [id number] Patient Location:  Home Provider location:   Office  Primary Care Provider:  Chesley Noon, MD Primary Cardiologist:  Minus Breeding, MD  Chief Complaint / Patient Profile   [name] is a 61 y.o. year old male with a history of CAD with prior stents to the LAD, RCA, and ramus intermedius. Last cath in 08/2018 showed patents stents to LAD and RCA but severe in-stent restenosis of prior stent of the proximal diagonal-ramus intermedius. Also has a history of paroxysmal atrial fibrillation on Eliquis, hypertension, hyperlipidemia, and hypothyroidism, who is pending a colonoscopy and presents today for telephonic preoperative cardiovascular risk assessment.  Past Medical History    Past Medical History:  Diagnosis Date   CAD in native artery, with prior stents, and instent restenosis of small vessel, medical therapy, LAD and RCA stents are patent 09/12/2018   Cancer ([place])    thyroid   DM (diabetes mellitus), type 2 ([place]) diet controlled 09/12/2018   HLD (hyperlipidemia) 09/12/2018   Hyperlipidemia    Hypertension    PAF (paroxysmal atrial fibrillation) ([place])    Syncope- may be due to diuretics 09/12/2018   Thyroid disease    Past  Surgical History:  Procedure Laterality Date   CARDIAC SURGERY     stents   CHOLECYSTECTOMY     LEFT HEART CATH AND CORONARY ANGIOGRAPHY N/A 09/11/2018   Procedure: LEFT HEART CATH AND CORONARY ANGIOGRAPHY;  Surgeon: Leonie Man, MD;  Location: [facility];  Service: Cardiovascular;  Laterality: N/A;    Allergies  Allergies  Allergen Reactions   Naproxen Swelling    History of Present Illness    William Mcmahon is a 61 y.o. male who presents via audio/video conferencing for a telehealth visit today.  Patient was last seen in our office on 10/19/2021 by Dr. Percival Spanish.  At that time, he was doing well with no recurrent sustained tachypalpitations or chest pain. He is now pending procedure as outlined above. Since his last visit, he has been doing well. He denies any chest pain, shortness of breath, orthopnea, PND. He notes an occasional flutter in his chest but not sustained/prolonged palpitations. No lightheadedness, dizziness, or syncope. He is able to complete >4.0 METS. He walks 3 miles every morning and has no chest pain or significant shortness of breath with this.  Home Medications    Prior to Admission medications   Medication Sig Start Date End Date Taking? Authorizing Provider  amLODipine (NORVASC) 10 MG tablet Take 1 tablet (10 mg total) by mouth daily. 11/10/21   Minus Breeding, MD  apixaban Arne [place]) 5 MG TABS tablet Take 1 tablet by mouth twice daily 11/03/21   Minus Breeding, MD  atorvastatin (LIPITOR) 80 MG tablet Take 1 tablet (80 mg total) by mouth daily. 12/01/21  Minus Breeding, MD  carvedilol (COREG) 12.5 MG tablet Take 1 tablet (12.5 mg total) by mouth 2 (two) times daily for 9 days. 08/19/21 02/19/22  Minus Breeding, MD  clopidogrel (PLAVIX) 75 MG tablet Take 1 tablet (75 mg total) by mouth daily. 11/16/21   Minus Breeding, MD  ezetimibe (ZETIA) 10 MG tablet Take 1 tablet (10 mg total) by mouth daily. 10/22/21 02/19/22  Minus Breeding, MD  isosorbide  mononitrate (IMDUR) 60 MG 24 hr tablet Take 1 tablet (60 mg total) by mouth daily. 12/04/21   Minus Breeding, MD  levothyroxine (SYNTHROID) 137 MCG tablet Take 137 mcg by mouth daily. 08/01/20   [provider]  nitroGLYCERIN (NITROSTAT) 0.4 MG SL tablet Place 1 tablet (0.4 mg total) under the tongue every 5 (five) minutes as needed for chest pain. 09/25/18   Lendon Colonel, NP  potassium chloride (KLOR-CON) 10 MEQ tablet TAKE 2 TABLETS BY MOUTH ON MONDAY, WEDNESDAY AND FRIDAY AND 1 TABLET ONCE DAILY ON TUESDAY, THURSDAY, SATURDAY AND SUNDAY Strength: 10 mEq 03/17/22   Minus Breeding, MD    Physical Exam    Vital Signs:  Judd Gaudier does not have vital signs available for review today.  Given telephonic nature of communication, physical exam is limited. Alert and oriented x3. No acute distress. Normal affect. Speech and respirations are unlabored.  Accessory Clinical Findings    None  Assessment & Plan    Preoperative Cardiovascular Risk Assessment: Patient has an upcoming colonoscopy planned. He is stable from a cardiac standpoint with with no angina, shortness of breath, acute CHF symptoms, significant palpitations, or syncope. He is able to complete >4.0 METS without an chest pain or significant shortness of breath. Therefore, based on ACC/AHA guidelines, patient would be at acceptable risk for the planned procedure without further cardiovascular testing. Per Dr. Percival Spanish, patient can hold Plavix for 5 days prior to procedure. Per office protocol, Eliquis can be held for 1-2 days prior to procedure. Both should be restarted as soon as safely possible afterwards. I will route this recommendation to the requesting party via Epic fax function.    Time:   Today, I have spent 5 minutes with the patient with telehealth technology discussing medical history, symptoms, and management plan.     Darreld Mclean, PA-C  04/12/2022, 1:12 PM

## 2022-04-12 ENCOUNTER — Ambulatory Visit (INDEPENDENT_AMBULATORY_CARE_PROVIDER_SITE_OTHER): Payer: Medicare Other | Admitting: Student

## 2022-04-12 ENCOUNTER — Other Ambulatory Visit: Payer: Self-pay | Admitting: Cardiology

## 2022-04-12 DIAGNOSIS — Z01810 Encounter for preprocedural cardiovascular examination: Secondary | ICD-10-CM | POA: Diagnosis not present

## 2022-05-25 ENCOUNTER — Other Ambulatory Visit: Payer: Self-pay

## 2022-05-25 ENCOUNTER — Telehealth: Payer: Self-pay | Admitting: Cardiology

## 2022-05-25 DIAGNOSIS — I4891 Unspecified atrial fibrillation: Secondary | ICD-10-CM

## 2022-05-25 MED ORDER — APIXABAN 5 MG PO TABS: 5.0000 mg | ORAL_TABLET | Freq: Two times a day (BID) | ORAL | 5 refills | Status: DC

## 2022-05-25 NOTE — Telephone Encounter (Signed)
*  STAT* If patient is at the pharmacy, call can be transferred to refill team.   1. Which medications need to be refilled? (please list name of each medication and dose if known) apixaban (ELIQUIS) 5 MG TABS tablet  2. Which pharmacy/location (including street and city if local pharmacy) is medication to be sent to? [place], [address]  3. Do they need a 30 day or 90 day supply? [place]

## 2022-05-25 NOTE — Telephone Encounter (Signed)
Prescription refill request for Eliquis received. Indication:Afib Last office visit:8/23 Scr:0.7 Age: 61 Weight:100.1 kg  Prescription refilled

## 2022-08-11 ENCOUNTER — Encounter: Payer: Self-pay | Admitting: Cardiology

## 2022-08-11 MED ORDER — POTASSIUM CHLORIDE ER 10 MEQ PO TBCR: EXTENDED_RELEASE_TABLET | ORAL | 3 refills | Status: DC

## 2022-09-16 ENCOUNTER — Other Ambulatory Visit: Payer: Self-pay | Admitting: Cardiology

## 2022-10-19 IMAGING — CR DG CHEST 2V
2 series · 2 of 2 positions shown · non-contrast
Comparison: 09/10/2018

CLINICAL DATA: Chest pain

EXAM:
CHEST - 2 VIEW

[chest pa]
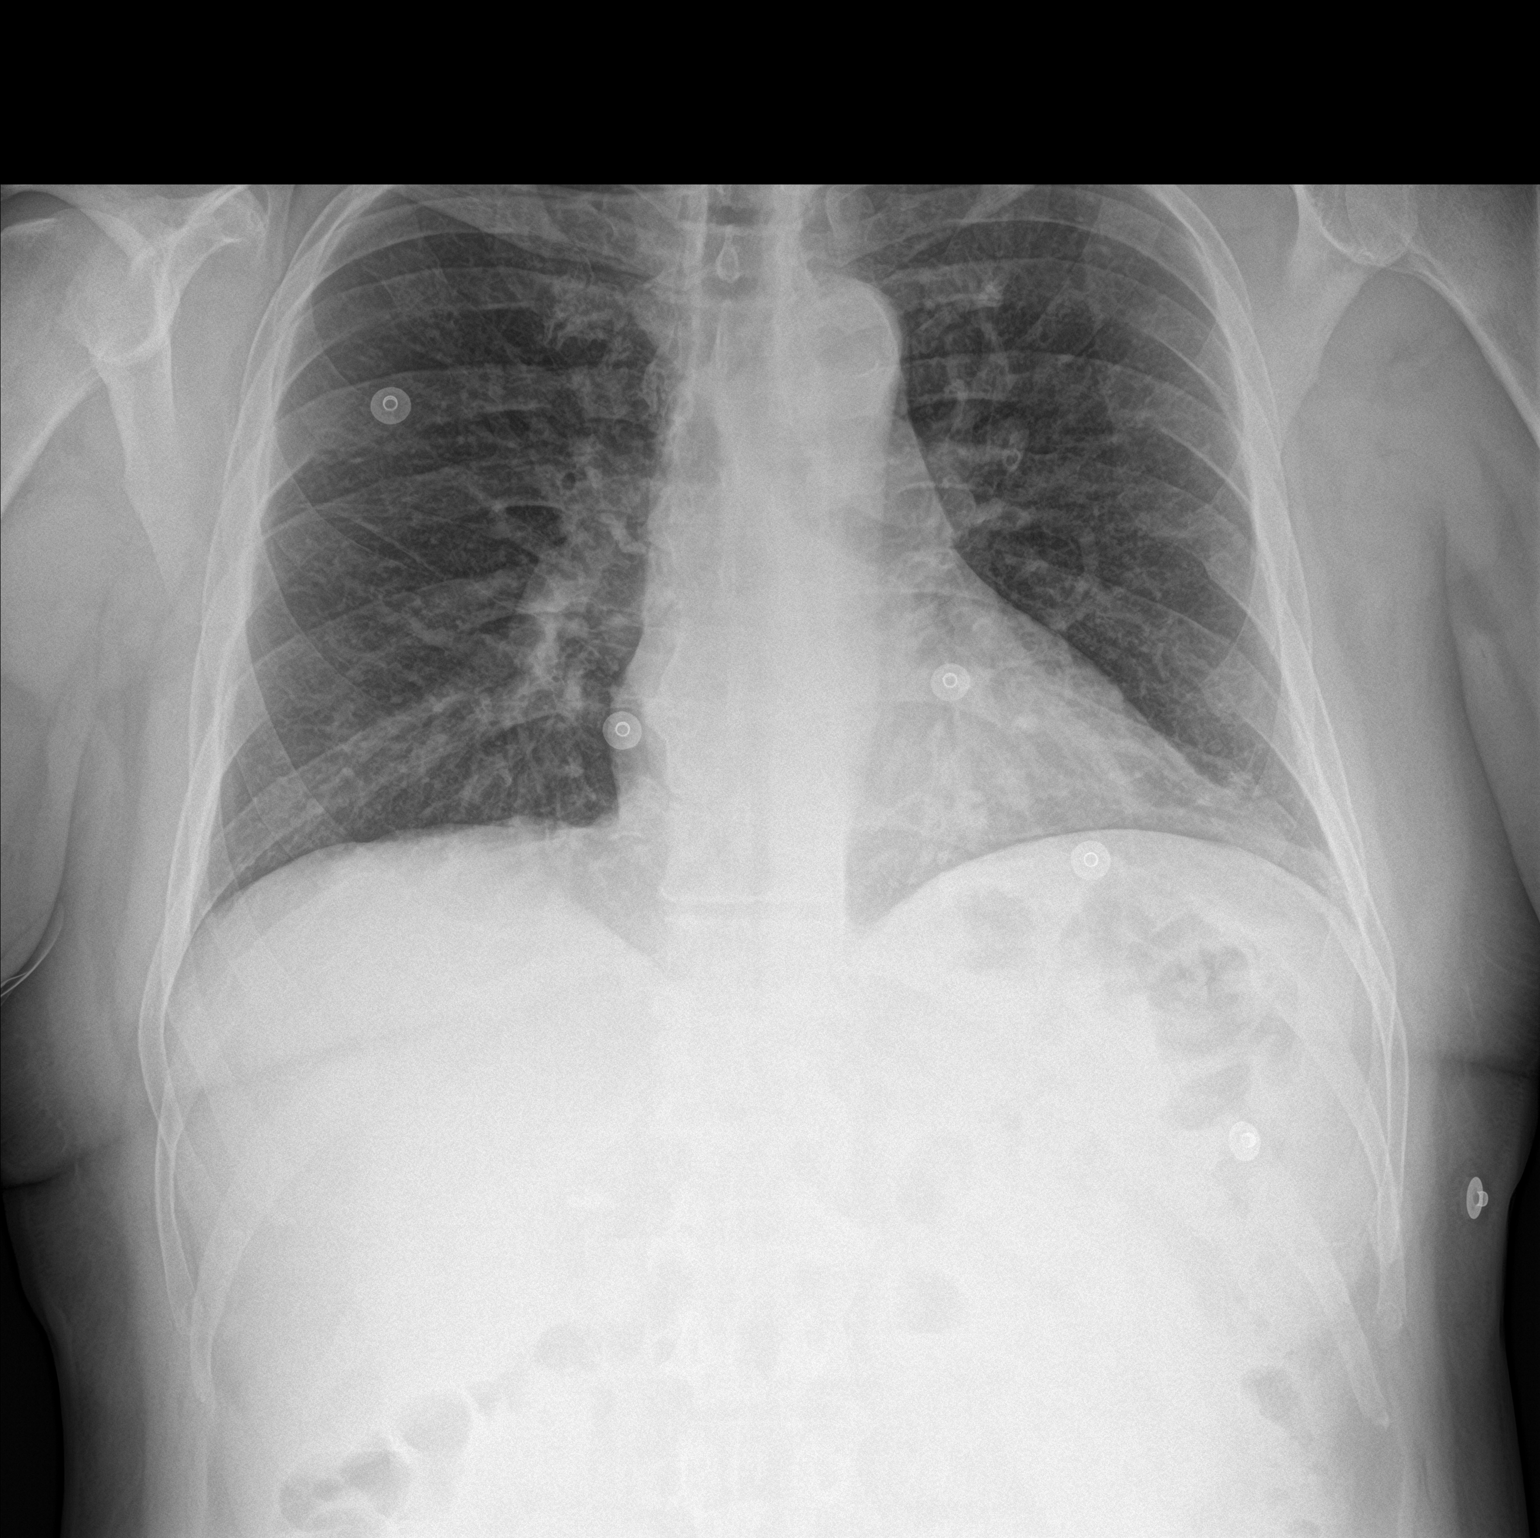

[chest lat]
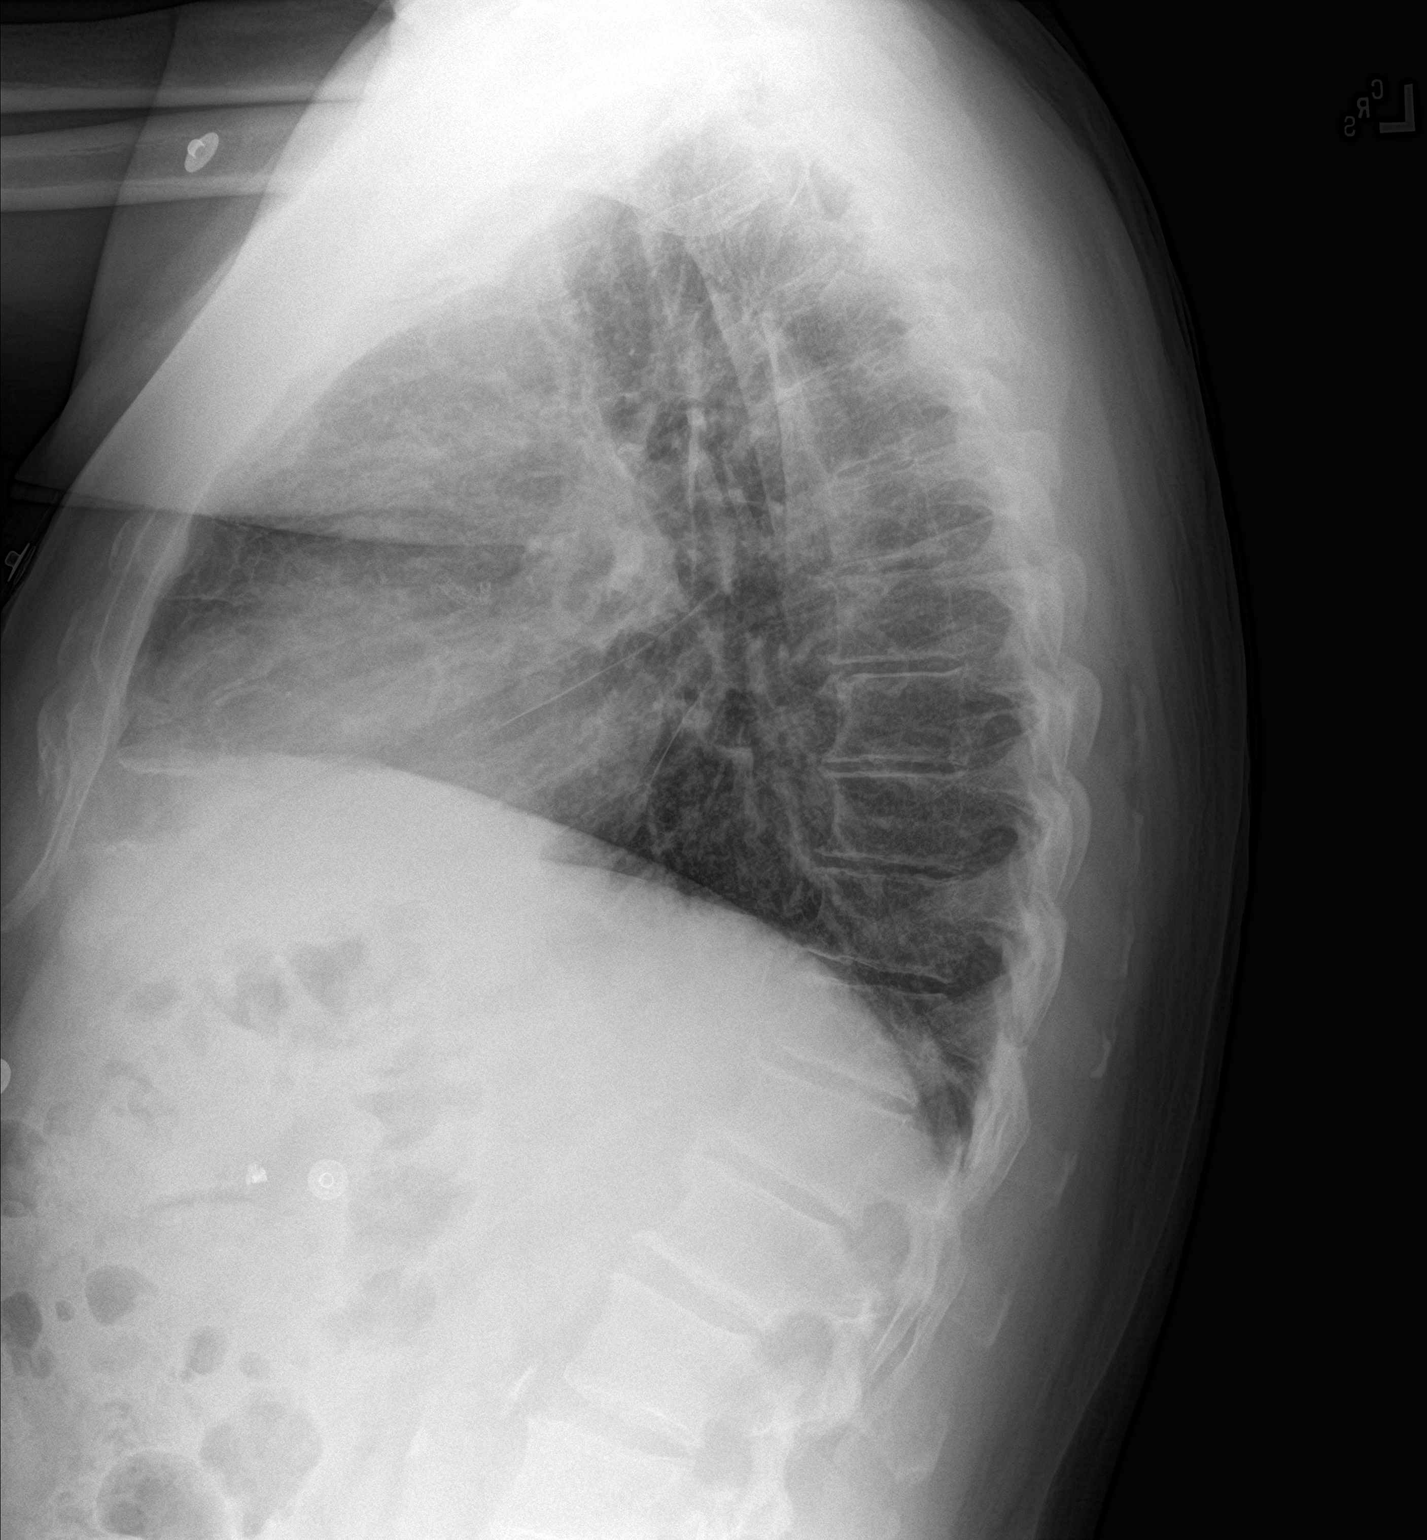

[2 of 2 positions shown; findings below may reference images not displayed]

FINDINGS: Lungs are clear.  No pleural effusion or pneumothorax.

The heart is normal in size.

Mild degenerative changes of the visualized thoracolumbar spine.

Cholecystectomy clips.
IMPRESSION: Normal chest radiographs.

## 2022-10-22 ENCOUNTER — Other Ambulatory Visit: Payer: Self-pay | Admitting: Cardiology

## 2022-10-22 DIAGNOSIS — E785 Hyperlipidemia, unspecified: Secondary | ICD-10-CM

## 2022-10-26 NOTE — Progress Notes (Unsigned)
Cardiology Office Note   Date:  10/28/2022   ID:  William Mcmahon, DOB [birth year], MRN [id number]  PCP:  Chesley Noon, MD  Cardiologist:   Minus Breeding, MD   Chief Complaint  Patient presents with   Coronary Artery Disease      History of Present Illness: William Mcmahon is a 62 y.o. male who presents for evaluation of coronary disease.   He has a history of CAD as listed below.  He was in the hospital in August 2022 with documented atrial fib.  He was treated with Cardizem.  Since I last saw him he has done well.  He remains active.  The patient denies any new symptoms such as chest discomfort, neck or arm discomfort. There has been no new shortness of breath, PND or orthopnea. There have been no reported palpitations, presyncope or syncope.     He deer hunts.  He is to be physically active with this and has had no symptoms. He was kind enough to bring me some homemade canned deer meat.       Past Medical History:  Diagnosis Date   CAD in native artery, with prior stents, and instent restenosis of small vessel, medical therapy, LAD and RCA stents are patent 09/12/2018   Cancer ([place])    thyroid   DM (diabetes mellitus), type 2 ([place]) diet controlled 09/12/2018   HLD (hyperlipidemia) 09/12/2018   Hyperlipidemia    Hypertension    PAF (paroxysmal atrial fibrillation) ([place])    Syncope- may be due to diuretics 09/12/2018   Thyroid disease     Past Surgical History:  Procedure Laterality Date   CARDIAC SURGERY     stents   CHOLECYSTECTOMY     LEFT HEART CATH AND CORONARY ANGIOGRAPHY N/A 09/11/2018   Procedure: LEFT HEART CATH AND CORONARY ANGIOGRAPHY;  Surgeon: Leonie Man, MD;  Location: [facility];  Service: Cardiovascular;  Laterality: N/A;     Current Outpatient Medications  Medication Sig Dispense Refill   amLODipine-benazepril (LOTREL) 10-20 MG capsule Take 1 capsule by mouth daily. 90 capsule 3   apixaban (ELIQUIS) 5 MG TABS tablet Take 1 tablet (5 mg  total) by mouth 2 (two) times daily. 60 tablet 5   atorvastatin (LIPITOR) 80 MG tablet Take 1 tablet (80 mg total) by mouth daily. 90 tablet 3   carvedilol (COREG) 12.5 MG tablet Take 1 tablet (12.5 mg total) by mouth 2 (two) times daily with a meal. 180 tablet 3   clopidogrel (PLAVIX) 75 MG tablet Take 1 tablet by mouth once daily 90 tablet 1   colestipol (COLESTID) 1 g tablet Take 1 g by mouth 2 (two) times daily.     ezetimibe (ZETIA) 10 MG tablet Take 1 tablet by mouth once daily 90 tablet 2   isosorbide mononitrate (IMDUR) 60 MG 24 hr tablet Take 1 tablet (60 mg total) by mouth daily. 90 tablet 3   levothyroxine (SYNTHROID) 137 MCG tablet Take 137 mcg by mouth daily.     potassium chloride (KLOR-CON) 10 MEQ tablet TAKE 2 TABLETS BY MOUTH ONCE DAILY ON MONDAY, WEDNESDAY AND FRIDAY THEN TAKE 1 TABLET ONCE DAILY ON TUESDAY, THURSDAY, SATURDAY AND SUNDAY 135 tablet 3   nitroGLYCERIN (NITROSTAT) 0.4 MG SL tablet Place 1 tablet (0.4 mg total) under the tongue every 5 (five) minutes as needed for chest pain. 25 tablet 2   No current facility-administered medications for this visit.    Allergies:   Naproxen  ROS:  Please see the history of present illness.   Otherwise, review of systems are positive for none.   All other systems are reviewed and negative.    PHYSICAL EXAM: VS:  BP (!) 146/82   Pulse (!) 50   Ht '5\' 11"'$  (1.803 m)   Wt 219 lb 3.2 oz (99.4 kg)   SpO2 98%   BMI 30.57 kg/m  , BMI Body mass index is 30.57 kg/m. GENERAL:  Well appearing NECK:  No jugular venous distention, waveform within normal limits, carotid upstroke brisk and symmetric, no bruits, no thyromegaly LUNGS:  Clear to auscultation bilaterally CHEST:  Unremarkable HEART:  PMI not displaced or sustained,S1 and S2 within normal limits, no S3, no S4, no clicks, no rubs, no murmurs ABD:  Flat, positive bowel sounds normal in frequency in pitch, no bruits, no rebound, no guarding, no midline pulsatile mass, no  hepatomegaly, no splenomegaly EXT:  2 plus pulses throughout, no edema, no cyanosis no clubbing   EKG:  EKG is   ordered today. Sinus rhythm, rate 50, right bundle branch block, left axis deviation, no acute ST-T wave changes   Recent Labs: No results found for requested labs within last 365 days.    Lipid Panel    Component Value Date/Time   CHOL 119 10/19/2021 0839   TRIG 106 10/19/2021 0839   HDL 29 (L) 10/19/2021 0839   CHOLHDL 4.1 10/19/2021 0839   CHOLHDL 4.6 09/22/2020 0303   VLDL 18 09/22/2020 0303   LDLCALC 70 10/19/2021 0839      Wt Readings from Last 3 Encounters:  10/28/22 219 lb 3.2 oz (99.4 kg)  10/19/21 220 lb 9.6 oz (100.1 kg)  04/16/21 211 lb 9.6 oz (96 kg)      Other studies Reviewed: Additional studies/ records that were reviewed today include:  Labs Review of the above records demonstrates:  Please see elsewhere in the note.     ASSESSMENT AND PLAN:  CAD:  The patient has no new sypmtoms.  No further cardiovascular testing is indicated.  We will continue with aggressive risk reduction and meds as listed.  DYSLIPIDEMIA:   LDL was 70 of 29.  I would like his LDL to be 50.  He is going to get his labs drawn soon by his primary provider.  If his LDL is not at 50 I will change him from Lipitor to Crestor 40 mg daily.   Blood sugars not well-controlled.  I am going to stop his Norvasc and add Lotrel 10/20 daily.     Current medicines are reviewed at length with the patient today.  The patient does not have concerns regarding medicines.  The following changes have been made: None  Labs/ tests ordered today include:   None  Orders Placed This Encounter  Procedures   EKG 12-Lead      Disposition:   FU with me in 12 months   Signed, Minus Breeding, MD  10/28/2022 11:12 AM    [place]

## 2022-10-28 ENCOUNTER — Ambulatory Visit: Payer: Medicare Other | Attending: Cardiology | Admitting: Cardiology

## 2022-10-28 ENCOUNTER — Encounter: Payer: Self-pay | Admitting: Cardiology

## 2022-10-28 VITALS — BP 146/82 | HR 50 | Ht 71.0 in | Wt 219.2 lb

## 2022-10-28 DIAGNOSIS — I2510 Atherosclerotic heart disease of native coronary artery without angina pectoris: Secondary | ICD-10-CM

## 2022-10-28 DIAGNOSIS — R0989 Other specified symptoms and signs involving the circulatory and respiratory systems: Secondary | ICD-10-CM

## 2022-10-28 DIAGNOSIS — E785 Hyperlipidemia, unspecified: Secondary | ICD-10-CM | POA: Diagnosis not present

## 2022-10-28 DIAGNOSIS — I480 Paroxysmal atrial fibrillation: Secondary | ICD-10-CM

## 2022-10-28 DIAGNOSIS — I10 Essential (primary) hypertension: Secondary | ICD-10-CM

## 2022-10-28 MED ORDER — AMLODIPINE BESY-BENAZEPRIL HCL 10-20 MG PO CAPS: 1.0000 | ORAL_CAPSULE | Freq: Every day | ORAL | 3 refills | Status: DC

## 2022-10-28 MED ORDER — NITROGLYCERIN 0.4 MG SL SUBL: 0.4000 mg | SUBLINGUAL_TABLET | SUBLINGUAL | 2 refills | Status: AC | PRN

## 2022-10-28 NOTE — Patient Instructions (Signed)
Medication Instructions:  STOP amlodipine START amlodipine/benazepril (Lotrel) 10-20 mg daily  *If you need a refill on your cardiac medications before your next appointment, please call your pharmacy*  Follow-Up: At [facility], you and your health needs are our priority.  As part of our continuing mission to provide you with exceptional heart care, we have created designated Provider Care Teams.  These Care Teams include your primary Cardiologist (physician) and Advanced Practice Providers (APPs -  Physician Assistants and Nurse Practitioners) who all work together to provide you with the care you need, when you need it.  We recommend signing up for the patient portal called "MyChart".  Sign up information is provided on this After Visit Summary.  MyChart is used to connect with patients for Virtual Visits (Telemedicine).  Patients are able to view lab/test results, encounter notes, upcoming appointments, etc.  Non-urgent messages can be sent to your provider as well.   To learn more about what you can do with MyChart, go to [email or web address].    Your next appointment:   12 month(s)  Provider:   Minus Breeding, MD

## 2022-11-22 ENCOUNTER — Other Ambulatory Visit: Payer: Self-pay | Admitting: Cardiology

## 2022-11-27 ENCOUNTER — Other Ambulatory Visit: Payer: Self-pay | Admitting: Cardiology

## 2022-11-27 DIAGNOSIS — I4891 Unspecified atrial fibrillation: Secondary | ICD-10-CM

## 2022-11-29 ENCOUNTER — Other Ambulatory Visit: Payer: Self-pay | Admitting: *Deleted

## 2022-11-29 DIAGNOSIS — I4891 Unspecified atrial fibrillation: Secondary | ICD-10-CM

## 2022-11-29 MED ORDER — APIXABAN 5 MG PO TABS: 5.0000 mg | ORAL_TABLET | Freq: Two times a day (BID) | ORAL | 1 refills | Status: DC

## 2022-11-29 NOTE — Telephone Encounter (Signed)
Eliquis 5mg  refill request received. Patient is 62 years old, weight-99.4kg, Crea-0.96 on 11/09/22 via [place], and last seen by Dr. Percival Spanish on 10/28/22. Dose is appropriate based on dosing criteria. Will send in refill to requested pharmacy.

## 2022-11-29 NOTE — Telephone Encounter (Signed)
Prescription refill request for Eliquis received. Indication: a fib Last office visit: 10/28/22 Scr: 0.96 KPN  11/08/22 Age: 62 Weight: 99kg

## 2023-01-23 ENCOUNTER — Other Ambulatory Visit: Payer: Self-pay

## 2023-01-23 ENCOUNTER — Emergency Department (HOSPITAL_COMMUNITY): Payer: Medicare Other

## 2023-01-23 ENCOUNTER — Encounter (HOSPITAL_COMMUNITY): Payer: Self-pay

## 2023-01-23 ENCOUNTER — Inpatient Hospital Stay (HOSPITAL_COMMUNITY)
Admission: EM | Admit: 2023-01-23 | Discharge: 2023-01-26 | DRG: 372 | Disposition: A | Discharge status: Home / Self Care | Payer: Medicare Other | Attending: Internal Medicine | Admitting: Internal Medicine

## 2023-01-23 ENCOUNTER — Observation Stay (HOSPITAL_COMMUNITY): Payer: Medicare Other

## 2023-01-23 DIAGNOSIS — Z955 Presence of coronary angioplasty implant and graft: Secondary | ICD-10-CM

## 2023-01-23 DIAGNOSIS — I10 Essential (primary) hypertension: Secondary | ICD-10-CM | POA: Diagnosis present

## 2023-01-23 DIAGNOSIS — Z79899 Other long term (current) drug therapy: Secondary | ICD-10-CM

## 2023-01-23 DIAGNOSIS — Z833 Family history of diabetes mellitus: Secondary | ICD-10-CM

## 2023-01-23 DIAGNOSIS — N179 Acute kidney failure, unspecified: Principal | ICD-10-CM | POA: Diagnosis present

## 2023-01-23 DIAGNOSIS — E782 Mixed hyperlipidemia: Secondary | ICD-10-CM

## 2023-01-23 DIAGNOSIS — E66811 Obesity, class 1: Secondary | ICD-10-CM | POA: Diagnosis present

## 2023-01-23 DIAGNOSIS — R5383 Other fatigue: Secondary | ICD-10-CM

## 2023-01-23 DIAGNOSIS — A040 Enteropathogenic Escherichia coli infection: Principal | ICD-10-CM | POA: Diagnosis present

## 2023-01-23 DIAGNOSIS — E785 Hyperlipidemia, unspecified: Secondary | ICD-10-CM | POA: Diagnosis present

## 2023-01-23 DIAGNOSIS — E039 Hypothyroidism, unspecified: Secondary | ICD-10-CM | POA: Diagnosis present

## 2023-01-23 DIAGNOSIS — I480 Paroxysmal atrial fibrillation: Secondary | ICD-10-CM | POA: Diagnosis present

## 2023-01-23 DIAGNOSIS — R531 Weakness: Secondary | ICD-10-CM

## 2023-01-23 DIAGNOSIS — Z8249 Family history of ischemic heart disease and other diseases of the circulatory system: Secondary | ICD-10-CM

## 2023-01-23 DIAGNOSIS — R0600 Dyspnea, unspecified: Secondary | ICD-10-CM | POA: Diagnosis not present

## 2023-01-23 DIAGNOSIS — E860 Dehydration: Secondary | ICD-10-CM | POA: Diagnosis present

## 2023-01-23 DIAGNOSIS — Z6830 Body mass index (BMI) 30.0-30.9, adult: Secondary | ICD-10-CM

## 2023-01-23 DIAGNOSIS — R079 Chest pain, unspecified: Secondary | ICD-10-CM | POA: Diagnosis not present

## 2023-01-23 DIAGNOSIS — I4510 Unspecified right bundle-branch block: Secondary | ICD-10-CM | POA: Diagnosis present

## 2023-01-23 DIAGNOSIS — E119 Type 2 diabetes mellitus without complications: Secondary | ICD-10-CM

## 2023-01-23 DIAGNOSIS — Z7901 Long term (current) use of anticoagulants: Secondary | ICD-10-CM

## 2023-01-23 DIAGNOSIS — E876 Hypokalemia: Secondary | ICD-10-CM | POA: Diagnosis present

## 2023-01-23 DIAGNOSIS — Z79890 Hormone replacement therapy (postmenopausal): Secondary | ICD-10-CM

## 2023-01-23 DIAGNOSIS — R6884 Jaw pain: Secondary | ICD-10-CM

## 2023-01-23 DIAGNOSIS — Z87891 Personal history of nicotine dependence: Secondary | ICD-10-CM

## 2023-01-23 DIAGNOSIS — E038 Other specified hypothyroidism: Secondary | ICD-10-CM | POA: Diagnosis present

## 2023-01-23 DIAGNOSIS — Z85850 Personal history of malignant neoplasm of thyroid: Secondary | ICD-10-CM

## 2023-01-23 DIAGNOSIS — R197 Diarrhea, unspecified: Secondary | ICD-10-CM | POA: Diagnosis present

## 2023-01-23 DIAGNOSIS — R1310 Dysphagia, unspecified: Secondary | ICD-10-CM

## 2023-01-23 DIAGNOSIS — E669 Obesity, unspecified: Secondary | ICD-10-CM | POA: Diagnosis present

## 2023-01-23 DIAGNOSIS — I2510 Atherosclerotic heart disease of native coronary artery without angina pectoris: Secondary | ICD-10-CM | POA: Diagnosis present

## 2023-01-23 DIAGNOSIS — R0602 Shortness of breath: Secondary | ICD-10-CM | POA: Diagnosis present

## 2023-01-23 LAB — BASIC METABOLIC PANEL
Anion gap: 10 (ref 5–15)
BUN: 25 mg/dL — ABNORMAL HIGH (ref 8–23)
CO2: 22 mmol/L (ref 22–32)
Calcium: 9.4 mg/dL (ref 8.9–10.3)
Chloride: 104 mmol/L (ref 98–111)
Creatinine, Ser: 1.59 mg/dL — ABNORMAL HIGH (ref 0.61–1.24)
GFR, Estimated: 49 mL/min — ABNORMAL LOW (ref >60)
Glucose, Bld: 157 mg/dL — ABNORMAL HIGH (ref 70–99)
Potassium: 3.2 mmol/L — ABNORMAL LOW (ref 3.5–5.1)
Sodium: 136 mmol/L (ref 135–145)

## 2023-01-23 LAB — CBC
HCT: 45.7 % (ref 39.0–52.0)
Hemoglobin: 16 g/dL (ref 13.0–17.0)
MCH: 31.9 pg (ref 26.0–34.0)
MCHC: 35 g/dL (ref 30.0–36.0)
MCV: 91 fL (ref 80.0–100.0)
Platelets: 218 10*3/uL (ref 150–400)
RBC: 5.02 MIL/uL (ref 4.22–5.81)
RDW: 11.9 % (ref 11.5–15.5)
WBC: 10.2 10*3/uL (ref 4.0–10.5)
nRBC: 0 % (ref 0.0–0.2)

## 2023-01-23 LAB — C DIFFICILE QUICK SCREEN W PCR REFLEX
C Diff antigen: NEGATIVE
C Diff interpretation: NOT DETECTED
C Diff toxin: NEGATIVE

## 2023-01-23 LAB — CK: Total CK: 145 U/L (ref 49–397)

## 2023-01-23 LAB — HEPATIC FUNCTION PANEL
ALT: 33 U/L (ref 0–44)
AST: 26 U/L (ref 15–41)
Albumin: 4 g/dL (ref 3.5–5.0)
Alkaline Phosphatase: 82 U/L (ref 38–126)
Bilirubin, Direct: 0.2 mg/dL (ref 0.0–0.2)
Indirect Bilirubin: 1.5 mg/dL — ABNORMAL HIGH (ref 0.3–0.9)
Total Bilirubin: 1.7 mg/dL — ABNORMAL HIGH (ref 0.3–1.2)
Total Protein: 6.7 g/dL (ref 6.5–8.1)

## 2023-01-23 LAB — BRAIN NATRIURETIC PEPTIDE: B Natriuretic Peptide: 16.5 pg/mL (ref 0.0–100.0)

## 2023-01-23 LAB — LIPASE, BLOOD: Lipase: 27 U/L (ref 11–51)

## 2023-01-23 LAB — MAGNESIUM: Magnesium: 1.9 mg/dL (ref 1.7–2.4)

## 2023-01-23 LAB — TROPONIN I (HIGH SENSITIVITY)
Troponin I (High Sensitivity): 13 ng/L (ref <18)
Troponin I (High Sensitivity): 11 ng/L (ref <18)

## 2023-01-23 MED ORDER — ATORVASTATIN CALCIUM 40 MG PO TABS
80.0000 mg | ORAL_TABLET | Freq: Every day | ORAL | Status: DC
  Administered 2023-01-24 – 2023-01-26 (×3): 80 mg via ORAL
  Filled 2023-01-23 (×3): qty 2

## 2023-01-23 MED ORDER — LEVOTHYROXINE SODIUM 25 MCG PO TABS
137.0000 ug | ORAL_TABLET | Freq: Every day | ORAL | Status: DC
  Administered 2023-01-24 – 2023-01-26 (×3): 137 ug via ORAL
  Filled 2023-01-23 (×3): qty 1

## 2023-01-23 MED ORDER — ISOSORBIDE MONONITRATE ER 30 MG PO TB24
60.0000 mg | ORAL_TABLET | Freq: Every day | ORAL | Status: DC
  Administered 2023-01-24 – 2023-01-26 (×3): 60 mg via ORAL
  Filled 2023-01-23 (×3): qty 2

## 2023-01-23 MED ORDER — ONDANSETRON HCL 4 MG/2ML IJ SOLN: 4.0000 mg | Freq: Four times a day (QID) | INTRAMUSCULAR | Status: DC | PRN

## 2023-01-23 MED ORDER — OXYCODONE HCL 5 MG PO TABS
5.0000 mg | ORAL_TABLET | ORAL | Status: DC | PRN
  Administered 2023-01-24 – 2023-01-25 (×2): 5 mg via ORAL
  Filled 2023-01-23 (×2): qty 1

## 2023-01-23 MED ORDER — HYDRALAZINE HCL 20 MG/ML IJ SOLN: 10.0000 mg | Freq: Four times a day (QID) | INTRAMUSCULAR | Status: DC | PRN

## 2023-01-23 MED ORDER — POTASSIUM CHLORIDE CRYS ER 10 MEQ PO TBCR
10.0000 meq | EXTENDED_RELEASE_TABLET | Freq: Every day | ORAL | Status: DC
  Administered 2023-01-23: 10 meq via ORAL
  Filled 2023-01-23 (×2): qty 1

## 2023-01-23 MED ORDER — SODIUM CHLORIDE 0.9% FLUSH
3.0000 mL | Freq: Two times a day (BID) | INTRAVENOUS | Status: DC
  Administered 2023-01-24 – 2023-01-26 (×3): 3 mL via INTRAVENOUS

## 2023-01-23 MED ORDER — PANCRELIPASE (LIP-PROT-AMYL) 36000-114000 UNITS PO CPEP
36000.0000 [IU] | ORAL_CAPSULE | Freq: Three times a day (TID) | ORAL | Status: DC
  Administered 2023-01-23: 36000 [IU] via ORAL
  Filled 2023-01-23 (×3): qty 1

## 2023-01-23 MED ORDER — CARVEDILOL 12.5 MG PO TABS
12.5000 mg | ORAL_TABLET | Freq: Two times a day (BID) | ORAL | Status: DC
  Administered 2023-01-23 – 2023-01-26 (×6): 12.5 mg via ORAL
  Filled 2023-01-23 (×5): qty 1

## 2023-01-23 MED ORDER — APIXABAN 5 MG PO TABS
5.0000 mg | ORAL_TABLET | Freq: Two times a day (BID) | ORAL | Status: DC
  Administered 2023-01-23 – 2023-01-26 (×6): 5 mg via ORAL
  Filled 2023-01-23 (×6): qty 1

## 2023-01-23 MED ORDER — ACETAMINOPHEN 650 MG RE SUPP: 650.0000 mg | Freq: Four times a day (QID) | RECTAL | Status: DC | PRN

## 2023-01-23 MED ORDER — POTASSIUM CHLORIDE 20 MEQ PO PACK
40.0000 meq | PACK | Freq: Once | ORAL | Status: AC
  Administered 2023-01-23: 40 meq via ORAL
  Filled 2023-01-23: qty 2

## 2023-01-23 MED ORDER — EZETIMIBE 10 MG PO TABS
10.0000 mg | ORAL_TABLET | Freq: Every day | ORAL | Status: DC
  Administered 2023-01-24 – 2023-01-26 (×3): 10 mg via ORAL
  Filled 2023-01-23 (×3): qty 1

## 2023-01-23 MED ORDER — ONDANSETRON HCL 4 MG PO TABS: 4.0000 mg | ORAL_TABLET | Freq: Four times a day (QID) | ORAL | Status: DC | PRN

## 2023-01-23 MED ORDER — SODIUM CHLORIDE 0.9 % IV SOLN: INTRAVENOUS | Status: DC

## 2023-01-23 MED ORDER — IOHEXOL 9 MG/ML PO SOLN
500.0000 mL | ORAL | Status: AC
  Administered 2023-01-23: 500 mL via ORAL

## 2023-01-23 MED ORDER — ACETAMINOPHEN 325 MG PO TABS: 650.0000 mg | ORAL_TABLET | Freq: Four times a day (QID) | ORAL | Status: DC | PRN

## 2023-01-23 MED ORDER — COLESTIPOL HCL 1 G PO TABS
1.0000 g | ORAL_TABLET | Freq: Two times a day (BID) | ORAL | Status: DC
  Administered 2023-01-24 – 2023-01-25 (×4): 1 g via ORAL
  Filled 2023-01-23 (×6): qty 1

## 2023-01-23 MED ORDER — CLOPIDOGREL BISULFATE 75 MG PO TABS
75.0000 mg | ORAL_TABLET | Freq: Every day | ORAL | Status: DC
  Administered 2023-01-24 – 2023-01-26 (×3): 75 mg via ORAL
  Filled 2023-01-23 (×3): qty 1

## 2023-01-23 MED ORDER — LACTATED RINGERS IV BOLUS
1000.0000 mL | Freq: Once | INTRAVENOUS | Status: AC
  Administered 2023-01-23: 1000 mL via INTRAVENOUS

## 2023-01-23 NOTE — ED Provider Notes (Signed)
[place] EMERGENCY DEPARTMENT AT [facility] Provider Note   CSN: [id number] Arrival date & time: 01/23/23  1308     History  Chief Complaint  Patient presents with   Shortness of Breath   Fatigue    William Mcmahon is a 62 y.o. male.  With PMH of CAD status post PCI x 5, DM, paroxysmal A-fib on Eliquis who presents with increased fatigue, dyspnea on exertion, and left-sided jaw pain and discomfort with exertion. No active discomfort at rest. Has had nausea and sweats associated with symptoms.  Says he felt this way the last time he had a blockage in his heart.  He never has necessarily any chest pain or arm pain.  He has been compliant with his medications including Eliquis.  He has had no leg pain or swelling.  No fevers, no chills, no cough.  Feels very worn out.  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 intake.   Shortness of Breath      Home Medications Prior to Admission medications   Medication Sig Start Date End Date Taking? Authorizing Provider  amLODipine-benazepril (LOTREL) 10-20 MG capsule Take 1 capsule by mouth daily. 10/28/22  Yes Rollene Rotunda, MD  apixaban (ELIQUIS) 5 MG TABS tablet Take 1 tablet (5 mg total) by mouth 2 (two) times daily. 11/29/22  Yes Rollene Rotunda, MD  atorvastatin (LIPITOR) 80 MG tablet Take 1 tablet by mouth once daily 11/22/22  Yes Hochrein, Fayrene Fearing, MD  carvedilol (COREG) 12.5 MG tablet Take 1 tablet (12.5 mg total) by mouth 2 (two) times daily with a meal. 09/16/22  Yes Rollene Rotunda, MD  clopidogrel (PLAVIX) 75 MG tablet Take 1 tablet by mouth once daily 10/22/22  Yes Hochrein, Fayrene Fearing, MD  colestipol (COLESTID) 1 g tablet Take 1 g by mouth 2 (two) times daily. 10/21/22  Yes [provider]  dicyclomine (BENTYL) 20 MG tablet Take 20 mg by mouth 3 (three) times daily before meals. Takes two hours before or five hours after other medications   Yes [provider]   ezetimibe (ZETIA) 10 MG tablet Take 1 tablet by mouth once daily 10/22/22  Yes Rollene Rotunda, MD  isosorbide mononitrate (IMDUR) 60 MG 24 hr tablet Take 1 tablet by mouth once daily 11/22/22  Yes Hochrein, Fayrene Fearing, MD  levothyroxine (SYNTHROID) 137 MCG tablet Take 150 mcg by mouth daily. 08/01/20  Yes [provider]  lipase/protease/amylase (CREON) 12000-38000 units CPEP capsule Take 24,000 Units by mouth 3 (three) times daily before meals.   Yes [provider]  nitroGLYCERIN (NITROSTAT) 0.4 MG SL tablet Place 1 tablet (0.4 mg total) under the tongue every 5 (five) minutes as needed for chest pain. 10/28/22  Yes Hochrein, Fayrene Fearing, MD  potassium chloride (KLOR-CON) 10 MEQ tablet TAKE 2 TABLETS BY MOUTH ONCE DAILY ON MONDAY, WEDNESDAY AND FRIDAY THEN TAKE 1 TABLET ONCE DAILY ON TUESDAY, THURSDAY, SATURDAY AND SUNDAY Patient taking differently: Take 10 mEq by mouth See admin instructions. TAKE 2 TABLETS BY MOUTH ONCE DAILY ON MONDAY, WEDNESDAY AND FRIDAY THEN TAKE 1 TABLET ONCE DAILY ON TUESDAY, THURSDAY, SATURDAY AND SUNDAY 08/11/22  Yes Rollene Rotunda, MD      Allergies    Naproxen    Review of Systems   Review of Systems  Respiratory:  Positive for shortness of breath.     Physical Exam Updated Vital Signs BP 129/71 (BP Location: Left Arm)   Pulse 61   Temp 98 F (36.7  C) (Oral)   Resp 18   Ht 5\' 11"  (1.803 m)   Wt 92.1 kg   SpO2 96%   BMI 28.33 kg/m  Physical Exam Constitutional: Alert and oriented.  No acute distress, nontoxic Eyes: Conjunctivae are normal. ENT      Mouth/Throat: Mucous membranes are mildly dry. Cardiovascular: S1, S2, bradycardic, regular rhythm, normal and symmetric distal pulses are present in all extremities.Warm and well perfused. Respiratory: Normal respiratory effort. Breath sounds are normal.  O2 sat 94 on RA Gastrointestinal: Soft and nondistended with minimal epigastrium tenderness no rebound or guarding Musculoskeletal: Normal range  of motion in all extremities.      Right lower leg: No tenderness or edema.      Left lower leg: No tenderness or edema. Neurologic: Normal speech and language.  No facial droop.  EOMI.  PERRL.  5 out of 5 strength bilateral upper and lower extremities.  Sensation grossly intact.  No gross focal neurologic deficits are appreciated. Skin: Skin is warm, dry and intact. No rash noted. Psychiatric: Mood and affect are normal. Speech and behavior are normal.  ED Results / Procedures / Treatments   Labs (all labs ordered are listed, but only abnormal results are displayed) Labs Reviewed  BASIC METABOLIC PANEL - Abnormal; Notable for the following components:      Result Value   Potassium 3.2 (*)    Glucose, Bld 157 (*)    BUN 25 (*)    Creatinine, Ser 1.59 (*)    GFR, Estimated 49 (*)    All other components within normal limits  HEPATIC FUNCTION PANEL - Abnormal; Notable for the following components:   Total Bilirubin 1.7 (*)    Indirect Bilirubin 1.5 (*)    All other components within normal limits  COMPREHENSIVE METABOLIC PANEL - Abnormal; Notable for the following components:   Potassium 3.2 (*)    Calcium 8.1 (*)    Total Protein 6.0 (*)    Total Bilirubin 1.6 (*)    All other components within normal limits  LIPID PANEL - Abnormal; Notable for the following components:   HDL 17 (*)    All other components within normal limits  C DIFFICILE QUICK SCREEN W PCR REFLEX    GASTROINTESTINAL PANEL BY PCR, STOOL (REPLACES STOOL CULTURE)  CBC  CK  BRAIN NATRIURETIC PEPTIDE  LIPASE, BLOOD  MAGNESIUM  CBC WITH DIFFERENTIAL/PLATELET  HIV ANTIBODY (ROUTINE TESTING W REFLEX)  GLUCOSE, CAPILLARY  URINALYSIS, ROUTINE W REFLEX MICROSCOPIC  SODIUM, URINE, RANDOM  CREATININE, URINE, RANDOM  HEMOGLOBIN A1C  TROPONIN I (HIGH SENSITIVITY)  TROPONIN I (HIGH SENSITIVITY)    EKG EKG Interpretation  Date/Time:  Sunday Jan 23 2023 13:04:32 EDT Ventricular Rate:  69 PR Interval:  146 QRS  Duration: 158 QT Interval:  424 QTC Calculation: 454 R Axis:   202 Text Interpretation: Normal sinus rhythm Right bundle branch block Abnormal ECG T wave inverrsions inferior and anterior leads When compared with ECG of 30-Mar-2021 19:04, PREVIOUS ECG IS PRESENT Confirmed by Vivien Rossetti ([id number]) on 01/23/2023 1:38:16 PM  Radiology CT ABDOMEN PELVIS WO CONTRAST  Result Date: 01/23/2023 CLINICAL DATA:  Diarrhea, fatigue EXAM: CT ABDOMEN AND PELVIS WITHOUT CONTRAST TECHNIQUE: Multidetector CT imaging of the abdomen and pelvis was performed following the standard protocol without IV contrast. RADIATION DOSE REDUCTION: This exam was performed according to the departmental dose-optimization program which includes automated exposure control, adjustment of the mA and/or kV according to patient size and/or use of iterative reconstruction technique. COMPARISON:  None Available. FINDINGS: Lower chest: Coronary artery and aortic calcifications. Linear scarring or atelectasis in the left base. No effusions. Hepatobiliary: No focal liver abnormality is seen. Status post cholecystectomy. No biliary dilatation. Pancreas: No focal abnormality or ductal dilatation. Spleen: No focal abnormality.  Normal size. Adrenals/Urinary Tract: Large cyst off the lower pole of the right kidney measures up to 9.3 cm. This appears simple. No follow-up imaging recommended. Punctate nonobstructing stone in the lower pole of the right kidney. No hydronephrosis. Adrenal glands and urinary bladder unremarkable. Stomach/Bowel: Normal appendix. Left colonic diverticulosis. No active diverticulitis. Stomach and small bowel decompressed, unremarkable. Vascular/Lymphatic: Heavily calcified aorta and iliac vessels. No evidence of aneurysm or adenopathy. Reproductive: No visible focal abnormality. Other: No free fluid or free air. Musculoskeletal: No acute bony abnormality. IMPRESSION: No acute findings in the abdomen or pelvis. Right lower pole  punctate nephrolithiasis.  No hydronephrosis. Aortic atherosclerosis. Left colonic diverticulosis.  No active diverticulitis. Electronically Signed   By: Charlett Nose M.D.   On: 01/23/2023 21:25   DG Chest 2 View  Result Date: 01/23/2023 CLINICAL DATA:  Chest pain. EXAM: CHEST - 2 VIEW COMPARISON:  Chest two views 03/30/2021 FINDINGS: Cardiac silhouette and mediastinal contours are within normal limits. Mild calcification within aortic arch. Mildly decreased lung volumes. The lungs are clear. No pleural effusion pneumothorax. Mild multilevel degenerative disc changes of the thoracic spine. Cholecystectomy clips. IMPRESSION: No active cardiopulmonary disease. Electronically Signed   By: Neita Garnet M.D.   On: 01/23/2023 14:37    Procedures Procedures    Medications Ordered in ED Medications  0.9 %  sodium chloride infusion ( Intravenous New Bag/Given 01/24/23 0213)  atorvastatin (LIPITOR) tablet 80 mg (80 mg Oral Not Given 01/23/23 1800)  carvedilol (COREG) tablet 12.5 mg (12.5 mg Oral Given 01/23/23 2235)  colestipol (COLESTID) tablet 1 g (1 g Oral Given 01/24/23 0804)  ezetimibe (ZETIA) tablet 10 mg (10 mg Oral Not Given 01/23/23 1800)  levothyroxine (SYNTHROID) tablet 137 mcg (137 mcg Oral Given 01/24/23 0548)  apixaban (ELIQUIS) tablet 5 mg (5 mg Oral Given 01/23/23 2234)  clopidogrel (PLAVIX) tablet 75 mg (75 mg Oral Not Given 01/23/23 1800)  isosorbide mononitrate (IMDUR) 24 hr tablet 60 mg (60 mg Oral Not Given 01/23/23 1801)  sodium chloride flush (NS) 0.9 % injection 3 mL (3 mLs Intravenous Not Given 01/23/23 2238)  acetaminophen (TYLENOL) tablet 650 mg (has no administration in time range)    Or  acetaminophen (TYLENOL) suppository 650 mg (has no administration in time range)  oxyCODONE (Oxy IR/ROXICODONE) immediate release tablet 5 mg (has no administration in time range)  ondansetron (ZOFRAN) tablet 4 mg (has no administration in time range)    Or  ondansetron (ZOFRAN) injection 4 mg  (has no administration in time range)  hydrALAZINE (APRESOLINE) injection 10 mg (has no administration in time range)  iohexol (OMNIPAQUE) 9 MG/ML oral solution 500 mL (500 mLs Oral Contrast Given 01/23/23 1844)  potassium chloride SA (KLOR-CON M) CR tablet 40 mEq (has no administration in time range)  potassium chloride (KLOR-CON M) CR tablet 10 mEq (has no administration in time range)  dicyclomine (BENTYL) tablet 20 mg (has no administration in time range)  lipase/protease/amylase (CREON) capsule 24,000 Units (has no administration in time range)  lactated ringers bolus 1,000 mL (0 mLs Intravenous Stopping previously hung infusion 01/23/23 1702)  potassium chloride (KLOR-CON) packet 40 mEq (40 mEq Oral Given 01/23/23 1526)    ED Course/ Medical Decision Making/ A&P Clinical Course as  of 01/24/23 1038  Sun Jan 23, 2023  1544 S/w Dr Janee Morn hospitalist will admit patient requesting GI pathogen panel and C. difficile panel as well as me to reach out to cardiologist for consult for his atypical symptoms and previous history. [VB]    Clinical Course User Index [VB] Mardene Sayer, MD                             Medical Decision Making Breccan Lampman is a 62 y.o. male.  With PMH of CAD status post PCI x 5, DM, paroxysmal A-fib on Eliquis who presents with increased fatigue, dyspnea on exertion, and left-sided jaw pain and discomfort with exertion.  This is also in the setting of increased nonbloody diarrhea from baseline.  Noting he is having up to a gallon of diarrhea daily over the past 3 days but no vomiting.  Regarding patient's atypical dyspnea on exertion and jaw discomfort on exertion, concern for atypical ACS.  EKG noted with right bundle branch block and T wave inversions inferior and anterior leads.  His initial troponin was 13 and reassuring, doubt active blockage and symptoms seem more consistent with if anything stable angina so we will request cardiology consult inpatient.  No  evidence of active NSTEMI or STEMI.  I suspect his weakness and fatigue is more likely from fluid losses from diarrhea.  His abdominal exam was quite benign I doubt any intra-abdominal pathology such as acute bowel obstruction, surgical emergency that would require CT imaging at this time.  Additionally, he has new AKI creatinine 1.59 almost doubled from baseline with elevated BUN 25 likely due to fluid losses as well as hypokalemia 3.2.  Started patient on IV fluids and oral potassium repletion..  I spoke with hospitalist who recommends obtaining GI pathogen panel and C. difficile.  S/w Dr Janee Morn hospitalist will admit patient requesting GI pathogen panel and C. difficile panel as well as me to reach out to cardiologist for consult for his atypical symptoms and previous history. [VB]  Amount and/or Complexity of Data Reviewed Labs: ordered. Radiology: ordered.  Risk Prescription drug management. Decision regarding hospitalization.     Final Clinical Impression(s) / ED Diagnoses Final diagnoses:  AKI (acute kidney injury) (HCC)  Dyspnea, unspecified type  Other fatigue    Rx / DC Orders ED Discharge Orders     None         Mardene Sayer, MD 01/24/23 1038
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
## 2023-01-23 NOTE — Consult Note (Signed)
CARDIOLOGY CONSULT NOTE       Patient ID: William Mcmahon MRN: [id number] DOB/AGE: [birth year] 62 y.o.  Admit date: 01/23/2023 Referring Physician: ER doctor Vivien Rossetti Primary Physician: Eartha Inch, MD Primary Cardiologist: Antoine Poche Reason for Consultation: Chest pain  Principal Problem:   Generalized weakness   HPI:  62 y.o. asked to see for chest pain. History of CAD with stents to RCA and LAD. Last cath 09/11/18 with patent stents and small vessel dx in sub total small IM and ostial PDA. Medical Rx. Until 3 days ago active walking 5 miles/day and deer hunting No chest pain or angina. Has had severe diarrhea for 3 days Labs with azotemia and Troponins are negative K 3.2  ECG with new RBBB and inferolateral T wave inversions. He has no jaw/chest pain now Weakness He has bile acid malabsorption And takes creon. Diarrhea is not bloody Has not eaten out or had other sick contacts    ROS All other systems reviewed and negative except as noted above  Past Medical History:  Diagnosis Date   CAD in native artery, with prior stents, and instent restenosis of small vessel, medical therapy, LAD and RCA stents are patent 09/12/2018   Cancer (HCC)    thyroid   DM (diabetes mellitus), type 2 (HCC) diet controlled 09/12/2018   HLD (hyperlipidemia) 09/12/2018   Hyperlipidemia    Hypertension    PAF (paroxysmal atrial fibrillation) (HCC)    Syncope- may be due to diuretics 09/12/2018   Thyroid disease     Family History  Problem Relation Age of Onset   CAD Father        s/p stent and CABG   Heart attack Maternal Grandfather     Social History   Socioeconomic History   Marital status: Married    Spouse name: Not on file   Number of children: Not on file   Years of education: Not on file   Highest education level: Not on file  Occupational History   Not on file  Tobacco Use   Smoking status: Former   Smokeless tobacco: Never  Substance and Sexual Activity   Alcohol use:  Never   Drug use: Never   Sexual activity: Not on file  Other Topics Concern   Not on file  Social History Narrative   Not on file   Social Determinants of Health   Financial Resource Strain: Not on file  Food Insecurity: Not on file  Transportation Needs: Not on file  Physical Activity: Not on file  Stress: Not on file  Social Connections: Not on file  Intimate Partner Violence: Not on file    Past Surgical History:  Procedure Laterality Date   CARDIAC SURGERY     stents   CHOLECYSTECTOMY     LEFT HEART CATH AND CORONARY ANGIOGRAPHY N/A 09/11/2018   Procedure: LEFT HEART CATH AND CORONARY ANGIOGRAPHY;  Surgeon: Marykay Lex, MD;  Location: [facility] INVASIVE CV LAB;  Service: Cardiovascular;  Laterality: N/A;      Current Facility-Administered Medications:    0.9 %  sodium chloride infusion, , Intravenous, Continuous, Rodolph Bong, MD  Current Outpatient Medications:    amLODipine-benazepril (LOTREL) 10-20 MG capsule, Take 1 capsule by mouth daily., Disp: 90 capsule, Rfl: 3   apixaban (ELIQUIS) 5 MG TABS tablet, Take 1 tablet (5 mg total) by mouth 2 (two) times daily., Disp: 180 tablet, Rfl: 1   atorvastatin (LIPITOR) 80 MG tablet, Take 1 tablet by mouth once daily, Disp: 90  tablet, Rfl: 0   carvedilol (COREG) 12.5 MG tablet, Take 1 tablet (12.5 mg total) by mouth 2 (two) times daily with a meal., Disp: 180 tablet, Rfl: 3   clopidogrel (PLAVIX) 75 MG tablet, Take 1 tablet by mouth once daily, Disp: 90 tablet, Rfl: 1   colestipol (COLESTID) 1 g tablet, Take 1 g by mouth 2 (two) times daily., Disp: , Rfl:    ezetimibe (ZETIA) 10 MG tablet, Take 1 tablet by mouth once daily, Disp: 90 tablet, Rfl: 2   isosorbide mononitrate (IMDUR) 60 MG 24 hr tablet, Take 1 tablet by mouth once daily, Disp: 90 tablet, Rfl: 0   levothyroxine (SYNTHROID) 137 MCG tablet, Take 137 mcg by mouth daily., Disp: , Rfl:    nitroGLYCERIN (NITROSTAT) 0.4 MG SL tablet, Place 1 tablet (0.4 mg total) under  the tongue every 5 (five) minutes as needed for chest pain., Disp: 25 tablet, Rfl: 2   potassium chloride (KLOR-CON) 10 MEQ tablet, TAKE 2 TABLETS BY MOUTH ONCE DAILY ON MONDAY, WEDNESDAY AND FRIDAY THEN TAKE 1 TABLET ONCE DAILY ON TUESDAY, THURSDAY, SATURDAY AND SUNDAY, Disp: 135 tablet, Rfl: 3   sodium chloride      Physical Exam: Blood pressure 118/72, pulse (!) 54, temperature (!) 97.5 F (36.4 C), resp. rate 17, height 5\' 11"  (1.803 m), weight 98.4 kg, SpO2 94 %.    Affect appropriate Healthy:  appears stated age HEENT: normal Neck supple with no adenopathy JVP normal no bruits no thyromegaly Lungs clear with no wheezing and good diaphragmatic motion Heart:  S1/S2 no murmur, no rub, gallop or click PMI normal Abdomen: benighn, BS positve, no tenderness, no AAA no bruit.  No HSM or HJR Distal pulses intact with no bruits No edema Neuro non-focal Skin warm and dry No muscular weakness   Labs:   Lab Results  Component Value Date   WBC 10.2 01/23/2023   HGB 16.0 01/23/2023   HCT 45.7 01/23/2023   MCV 91.0 01/23/2023   PLT 218 01/23/2023    Recent Labs  Lab 01/23/23 1316  NA 136  K 3.2*  CL 104  CO2 22  BUN 25*  CREATININE 1.59*  CALCIUM 9.4  GLUCOSE 157*   Lab Results  Component Value Date   CKTOTAL 145 01/23/2023   TROPONINI <0.03 09/11/2018    Lab Results  Component Value Date   CHOL 119 10/19/2021   CHOL 129 09/22/2020   CHOL 113 09/11/2018   Lab Results  Component Value Date   HDL 29 (L) 10/19/2021   HDL 28 (L) 09/22/2020   HDL 25 (L) 09/11/2018   Lab Results  Component Value Date   LDLCALC 70 10/19/2021   LDLCALC 83 09/22/2020   LDLCALC 73 09/11/2018   Lab Results  Component Value Date   TRIG 106 10/19/2021   TRIG 89 09/22/2020   TRIG 73 09/11/2018   Lab Results  Component Value Date   CHOLHDL 4.1 10/19/2021   CHOLHDL 4.6 09/22/2020   CHOLHDL 4.5 09/11/2018   No results found for: "LDLDIRECT"    Radiology: DG Chest 2  View  Result Date: 01/23/2023 CLINICAL DATA:  Chest pain. EXAM: CHEST - 2 VIEW COMPARISON:  Chest two views 03/30/2021 FINDINGS: Cardiac silhouette and mediastinal contours are within normal limits. Mild calcification within aortic arch. Mildly decreased lung volumes. The lungs are clear. No pleural effusion pneumothorax. Mild multilevel degenerative disc changes of the thoracic spine. Cholecystectomy clips. IMPRESSION: No active cardiopulmonary disease. Electronically Signed   By: Windy Fast  Viola M.D.   On: 01/23/2023 14:37    EKG: See HPI   ASSESSMENT AND PLAN:   Chest pain:  limited jaw pain Until diarrheal illness walking 5 miles / day with no issues Troponin negative ECG changes new RBBB inferior and anterior lateral T wave changes. Last cath 2024 with patent RCA/LAD stents sub total small IM and 70% ostial PDA lesions. Continue home meds Supplement K check echo in am and follow serial ECGls Suspect he can be risk stratified as outpatient with exercise myovue after d/c unless TTE shows new LV dysfunction or RWMA which is doubtful with negative troponin  Diarrhea:  with azotemia and history of pancreatic insufficiency on Creon Watery diarrhea may be from bile acid dumping GI consult GI panel stool culture and C diff per primary service  Thyroid:  on synthroid replacement check TSH  PAF:  distant 2022 no recurrence he is on eliquis and beta blocker  HLD:  LDL 70 at goal On statin zetia and colestid along with statin  Signed: Charlton Haws 01/23/2023, 4:25 PM

## 2023-01-23 NOTE — Progress Notes (Signed)
Approximately 1730--Pt arrived to room [facility] from ED. Upon arrival, pt A&O x 4 and VS obtained. No S/S of acute distress at this time. Patient on RA. Telemetry connected and notified. Full assessment completed by this RN. Patient's wife at bedside.

## 2023-01-23 NOTE — H&P (Signed)
History and Physical    William Mcmahon UEA:[id number] DOB: 02-Sep-1960 DOA: 01/23/2023  PCP: Eartha Inch, MD  Patient coming from: Home  I have personally briefly reviewed patient's old medical records in [facility]  Chief Complaint: Shortness of breath, fatigue, jaw pain on exertion.  HPI: William Mcmahon is a 62 y.o. 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  Patient states symptoms are fine at rest.  Patient states left jaw pain is usually how his cardiac symptoms present.  Patient also endorses a 2-day history of significant copious amounts of diarrhea worse than his chronic diarrhea which he describes as gallons of watery malodorous loose stools.  Patient endorses some lightheadedness and dizziness, right sided abdominal pain.  Patient denies any fevers, no chills, no emesis, no constipation, no melena, no hematemesis, no hematochezia.  Patient does endorse some nausea.  ED Course: Patient seen in the ED, EKG done with new right bundle branch block and inferior lateral T wave inversion.  Basic metabolic profile obtained with a potassium of 3.2, glucose of 157, BUN of 25, creatinine of 1.59 otherwise within normal limits.  BNP of 16.5.  Total CK of 145.  High-sensitivity troponin of 13.  Chest x-ray obtained with no active cardiopulmonary disease.  Patient given some IV fluids, oral potassium, hospitalist were called to admit the patient for further evaluation and management.  Review of  Systems: As per HPI otherwise all other systems reviewed and are negative.  Past Medical History:  Diagnosis Date   CAD in native artery, with prior stents, and instent restenosis of small vessel, medical therapy, LAD and RCA stents are patent 09/12/2018   Cancer (HCC)    thyroid   DM (diabetes mellitus), type 2 (HCC) diet controlled 09/12/2018   HLD (hyperlipidemia) 09/12/2018   Hyperlipidemia    Hypertension    PAF (paroxysmal atrial fibrillation) (HCC)    Syncope- may be due to diuretics 09/12/2018   Thyroid disease     Past Surgical History:  Procedure Laterality Date   CARDIAC SURGERY     stents   CHOLECYSTECTOMY     LEFT HEART CATH AND CORONARY ANGIOGRAPHY N/A 09/11/2018   Procedure: LEFT HEART CATH AND CORONARY ANGIOGRAPHY;  Surgeon: Marykay Lex, MD;  Location: [facility] INVASIVE CV LAB;  Service: Cardiovascular;  Laterality: N/A;    Social History  reports that he has quit smoking. He has never used smokeless tobacco. He reports that he does not drink alcohol and does not use drugs.  Allergies  Allergen Reactions   Naproxen Swelling    Family History  Problem Relation Age of Onset   Diabetes Mother    CAD Father        s/p stent and CABG   Heart attack Maternal Grandfather    Father alive age 95 with history of CAD.  Mother deceased age 34 from complications of diabetes.  Prior to Admission medications   Medication Sig Start Date End Date Taking? Authorizing Provider  amLODipine-benazepril (LOTREL) 10-20 MG capsule Take 1 capsule by mouth daily. 10/28/22   Rollene Rotunda, MD  apixaban (  ELIQUIS) 5 MG TABS tablet Take 1 tablet (5 mg total) by mouth 2 (two) times daily. 11/29/22   Rollene Rotunda, MD  atorvastatin (LIPITOR) 80 MG tablet Take 1 tablet by mouth once daily 11/22/22   Rollene Rotunda, MD  carvedilol (COREG) 12.5 MG tablet Take 1 tablet (12.5 mg total) by mouth 2 (two) times daily with a meal. 09/16/22   Rollene Rotunda, MD  clopidogrel (PLAVIX) 75 MG tablet Take  1 tablet by mouth once daily 10/22/22   Rollene Rotunda, MD  colestipol (COLESTID) 1 g tablet Take 1 g by mouth 2 (two) times daily. 10/21/22   [provider]  ezetimibe (ZETIA) 10 MG tablet Take 1 tablet by mouth once daily 10/22/22   Rollene Rotunda, MD  isosorbide mononitrate (IMDUR) 60 MG 24 hr tablet Take 1 tablet by mouth once daily 11/22/22   Rollene Rotunda, MD  levothyroxine (SYNTHROID) 137 MCG tablet Take 137 mcg by mouth daily. 08/01/20   [provider]  nitroGLYCERIN (NITROSTAT) 0.4 MG SL tablet Place 1 tablet (0.4 mg total) under the tongue every 5 (five) minutes as needed for chest pain. 10/28/22   Rollene Rotunda, MD  potassium chloride (KLOR-CON) 10 MEQ tablet TAKE 2 TABLETS BY MOUTH ONCE DAILY ON MONDAY, [facility] AND FRIDAY THEN TAKE 1 TABLET ONCE DAILY ON [facility] AND SUNDAY 08/11/22   Rollene Rotunda, MD    Physical Exam: Vitals:   01/23/23 1313 01/23/23 1313 01/23/23 1515 01/23/23 1600  BP:  114/74 106/63 118/72  Pulse:  69 (!) 57 (!) 54  Resp:  19 13 17   Temp:  (!) 97.5 F (36.4 C)    SpO2:  98% 94% 94%  Weight: 98.4 kg     Height: 5\' 11"  (1.803 m)       Constitutional: NAD, calm, comfortable Vitals:   01/23/23 1313 01/23/23 1313 01/23/23 1515 01/23/23 1600  BP:  114/74 106/63 118/72  Pulse:  69 (!) 57 (!) 54  Resp:  19 13 17   Temp:  (!) 97.5 F (36.4 C)    SpO2:  98% 94% 94%  Weight: 98.4 kg     Height: 5\' 11"  (1.803 m)      Eyes: PERRL, lids and conjunctivae normal ENMT: Mucous membranes are dry. Posterior pharynx clear of any exudate or lesions.Normal dentition.  Neck: normal, supple, no masses, no thyromegaly Respiratory: clear to auscultation bilaterally, no wheezing, no crackles. Normal respiratory effort. No accessory muscle use.  Cardiovascular: Regular rate and rhythm, no murmurs / rubs / gallops. No extremity edema. 2+ pedal pulses. No carotid bruits.  Abdomen: no tenderness, no masses palpated. No  hepatosplenomegaly. Bowel sounds positive.  Musculoskeletal: no clubbing / cyanosis. No joint deformity upper and lower extremities. Good ROM, no contractures. Normal muscle tone.  Skin: no rashes, lesions, ulcers. No induration Neurologic: CN 2-12 grossly intact. Sensation intact, DTR normal. Strength 5/5 in all 4.  Psychiatric: Normal judgment and insight. Alert and oriented x 3. Normal mood.   Labs on Admission: I have personally reviewed following labs and imaging studies  CBC: Recent Labs  Lab 01/23/23 1316  WBC 10.2  HGB 16.0  HCT 45.7  MCV 91.0  PLT 218    Basic Metabolic Panel: Recent Labs  Lab 01/23/23 1316  NA 136  K 3.2*  CL 104  CO2 22  GLUCOSE 157*  BUN 25*  CREATININE 1.59*  CALCIUM 9.4    GFR: Estimated Creatinine Clearance: 58.3 mL/min (A) (by C-G formula based on SCr of  1.59 mg/dL (H)).  Liver Function Tests: No results for input(s): "AST", "ALT", "ALKPHOS", "BILITOT", "PROT", "ALBUMIN" in the last 168 hours.  Urine analysis: No results found for: "COLORURINE", "APPEARANCEUR", "LABSPEC", "PHURINE", "GLUCOSEU", "HGBUR", "BILIRUBINUR", "KETONESUR", "PROTEINUR", "UROBILINOGEN", "NITRITE", "LEUKOCYTESUR"  Radiological Exams on Admission: DG Chest 2 View  Result Date: 01/23/2023 CLINICAL DATA:  Chest pain. EXAM: CHEST - 2 VIEW COMPARISON:  Chest two views 03/30/2021 FINDINGS: Cardiac silhouette and mediastinal contours are within normal limits. Mild calcification within aortic arch. Mildly decreased lung volumes. The lungs are clear. No pleural effusion pneumothorax. Mild multilevel degenerative disc changes of the thoracic spine. Cholecystectomy clips. IMPRESSION: No active cardiopulmonary disease. Electronically Signed   By: Neita Garnet M.D.   On: 01/23/2023 14:37    EKG: Independently reviewed. New RBBB with inferior lateral T wave inversion.  Assessment/Plan Principal Problem:   Generalized weakness Active Problems:   AKI (acute kidney injury)  (HCC)   CAD in native artery, with prior stents, and instent restenosis of small vessel, medical therapy, LAD and RCA stents are patent   HLD (hyperlipidemia)   DM (diabetes mellitus), type 2 (HCC) diet controlled   Hypokalemia   Essential hypertension   SOB (shortness of breath)   PAF (paroxysmal atrial fibrillation) (HCC)   Dysphagia   Hypothyroidism   Obesity (BMI 30.0-34.9)   Acute diarrhea   Dehydration   Other fatigue   Dyspnea    #1 generalized weakness/fatigue/shortness of breath on exertion/jaw pain/rule out anginal equivalent -Concern for probable anginal equivalent as patient states usually with his cardiac issues never has chest pain but does manifest his jaw and neck pain. -EKG done with new RBBB with inferior lateral T wave inversion noted. -Initial set of troponin negative. -Will check a 2D echo. -Check a fasting lipid panel. -Continue home cardiac regimen. -Hold Lotrel. -Cardiology consulted for further evaluation and management.  2.  Acute on chronic diarrhea -Patient with history of bile acid malabsorption, history of chronic diarrhea however states over the past 2 days has had significant increase in diarrhea which she describes as gallons of liquid malodorous stool. -Check a C. difficile PCR, check a GI pathogen panel. -Check a CT abdomen and pelvis. -If C. difficile PCR and GI pathogen panel are negative will place on a trial of Imodium. -Continue home regimen colestipol. -Patient knows also on Creon which we will resume.  3.  Acute kidney injury -Likely secondary to prerenal azotemia as patient presented with significant increased GI losses from acute on chronic diarrhea, in the setting of ACE inhibitor. -Check a UA, urine sodium, urine creatinine. -Hold ACE inhibitor. -CT abdomen and pelvis ordered for problem #2. -IV fluids. -Supportive care.  4.  Hyperlipidemia -Check a fasting lipid panel. -Resume home regimen Lipitor and Zetia.  5.   Dehydration IV fluids.  6.  Paroxysmal atrial fibrillation Continue home regimen Coreg.  Eliquis for anticoagulation.  7.  Hypothyroidism -Resume home regimen Synthroid.  8.  Hypokalemia -Likely secondary to GI losses. -Resume home regimen oral potassium supplementation. -Patient already received oral potassium in the ED. -Magnesium level pending.  9.  Diet-controlled diabetes mellitus type 2 -Check a hemoglobin A1c.  10.  Hypertension -BP stable. -Hold Lotrel. -Continue cardiac medication of Coreg, Imdur.  11.  Obesity -BMI 30.27 kg/m. -Lifestyle modification -Outpatient follow-up with PCP.  DVT prophylaxis: Eliquis Code Status:   Full Family Communication:  Updated patient and wife at bedside. Disposition Plan:   Patient is from:  Home  Anticipated DC to:  Home  Anticipated DC date:  1 to 2 days  Anticipated DC barriers: Clinical improvement  Consults called:  Cardiology: Dr.Nishan  Admission status:  Place in observation/cardiac telemetry  Severity of Illness: The appropriate patient status for this patient is OBSERVATION. Observation status is judged to be reasonable and necessary in order to provide the required intensity of service to ensure the patient's safety. The patient's presenting symptoms, physical exam findings, and initial radiographic and laboratory data in the context of their medical condition is felt to place them at decreased risk for further clinical deterioration. Furthermore, it is anticipated that the patient will be medically stable for discharge from the hospital within 2 midnights of admission.     Ramiro Harvest MD [facility]  How to contact the [facility] Attending or Consulting provider 7A - 7P or covering provider during after hours 7P -7A, for this patient?   Check the care team in [facility] and look for a) attending/consulting [facility] provider listed and b) the [facility] team listed Log into [email or web address] and use [place]'s universal password  to access. If you do not have the password, please contact the hospital operator. Locate the [facility] provider you are looking for under [facility] and page to a number that you can be directly reached. If you still have difficulty reaching the provider, please page the [facility] (Director on Call) for the Hospitalists listed on amion for assistance.  01/23/2023, 5:05 PM

## 2023-01-24 ENCOUNTER — Observation Stay (HOSPITAL_BASED_OUTPATIENT_CLINIC_OR_DEPARTMENT_OTHER): Payer: Medicare Other

## 2023-01-24 DIAGNOSIS — E860 Dehydration: Secondary | ICD-10-CM | POA: Diagnosis not present

## 2023-01-24 DIAGNOSIS — R197 Diarrhea, unspecified: Secondary | ICD-10-CM | POA: Diagnosis not present

## 2023-01-24 DIAGNOSIS — R531 Weakness: Secondary | ICD-10-CM | POA: Diagnosis not present

## 2023-01-24 DIAGNOSIS — I2510 Atherosclerotic heart disease of native coronary artery without angina pectoris: Secondary | ICD-10-CM | POA: Diagnosis not present

## 2023-01-24 DIAGNOSIS — I25118 Atherosclerotic heart disease of native coronary artery with other forms of angina pectoris: Secondary | ICD-10-CM

## 2023-01-24 DIAGNOSIS — R9431 Abnormal electrocardiogram [ECG] [EKG]: Secondary | ICD-10-CM

## 2023-01-24 DIAGNOSIS — E782 Mixed hyperlipidemia: Secondary | ICD-10-CM | POA: Diagnosis not present

## 2023-01-24 DIAGNOSIS — N179 Acute kidney failure, unspecified: Secondary | ICD-10-CM | POA: Diagnosis not present

## 2023-01-24 LAB — ECHOCARDIOGRAM COMPLETE
Area-P 1/2: 2.56 cm2
Calc EF: 61.6 %
Height: 71 in
S' Lateral: 2.9 cm
Single Plane A2C EF: 64.5 %
Single Plane A4C EF: 59.4 %
Weight: 3249.6 oz

## 2023-01-24 LAB — CBC WITH DIFFERENTIAL/PLATELET
Abs Immature Granulocytes: 0.03 10*3/uL (ref 0.00–0.07)
Basophils Absolute: 0 10*3/uL (ref 0.0–0.1)
Basophils Relative: 1 %
Eosinophils Absolute: 0.1 10*3/uL (ref 0.0–0.5)
Eosinophils Relative: 1 %
HCT: 39.3 % (ref 39.0–52.0)
Hemoglobin: 13.8 g/dL (ref 13.0–17.0)
Immature Granulocytes: 0 %
Lymphocytes Relative: 24 %
Lymphs Abs: 1.7 10*3/uL (ref 0.7–4.0)
MCH: 30.8 pg (ref 26.0–34.0)
MCHC: 35.1 g/dL (ref 30.0–36.0)
MCV: 87.7 fL (ref 80.0–100.0)
Monocytes Absolute: 0.9 10*3/uL (ref 0.1–1.0)
Monocytes Relative: 12 %
Neutro Abs: 4.4 10*3/uL (ref 1.7–7.7)
Neutrophils Relative %: 62 %
Platelets: 159 10*3/uL (ref 150–400)
RBC: 4.48 MIL/uL (ref 4.22–5.81)
RDW: 11.9 % (ref 11.5–15.5)
WBC: 7.1 10*3/uL (ref 4.0–10.5)
nRBC: 0 % (ref 0.0–0.2)

## 2023-01-24 LAB — LIPID PANEL
Cholesterol: 60 mg/dL (ref 0–200)
HDL: 17 mg/dL — ABNORMAL LOW (ref >40)
LDL Cholesterol: 28 mg/dL (ref 0–99)
Total CHOL/HDL Ratio: 3.5 RATIO
Triglycerides: 76 mg/dL (ref <150)
VLDL: 15 mg/dL (ref 0–40)

## 2023-01-24 LAB — COMPREHENSIVE METABOLIC PANEL
ALT: 29 U/L (ref 0–44)
AST: 20 U/L (ref 15–41)
Albumin: 3.5 g/dL (ref 3.5–5.0)
Alkaline Phosphatase: 74 U/L (ref 38–126)
Anion gap: 9 (ref 5–15)
BUN: 19 mg/dL (ref 8–23)
CO2: 22 mmol/L (ref 22–32)
Calcium: 8.1 mg/dL — ABNORMAL LOW (ref 8.9–10.3)
Chloride: 110 mmol/L (ref 98–111)
Creatinine, Ser: 1.03 mg/dL (ref 0.61–1.24)
GFR, Estimated: >60 mL/min (ref >60)
Glucose, Bld: 93 mg/dL (ref 70–99)
Potassium: 3.2 mmol/L — ABNORMAL LOW (ref 3.5–5.1)
Sodium: 141 mmol/L (ref 135–145)
Total Bilirubin: 1.6 mg/dL — ABNORMAL HIGH (ref 0.3–1.2)
Total Protein: 6 g/dL — ABNORMAL LOW (ref 6.5–8.1)

## 2023-01-24 LAB — GLUCOSE, CAPILLARY: Glucose-Capillary: 89 mg/dL (ref 70–99)

## 2023-01-24 LAB — HIV ANTIBODY (ROUTINE TESTING W REFLEX): HIV Screen 4th Generation wRfx: NONREACTIVE

## 2023-01-24 MED ORDER — POTASSIUM CHLORIDE CRYS ER 10 MEQ PO TBCR
10.0000 meq | EXTENDED_RELEASE_TABLET | Freq: Every day | ORAL | Status: DC
  Administered 2023-01-25 – 2023-01-26 (×2): 10 meq via ORAL
  Filled 2023-01-24 (×2): qty 1

## 2023-01-24 MED ORDER — POTASSIUM CHLORIDE CRYS ER 20 MEQ PO TBCR
40.0000 meq | EXTENDED_RELEASE_TABLET | ORAL | Status: AC
  Administered 2023-01-24 (×2): 40 meq via ORAL
  Filled 2023-01-24 (×2): qty 2

## 2023-01-24 MED ORDER — DICYCLOMINE HCL 20 MG PO TABS
20.0000 mg | ORAL_TABLET | Freq: Three times a day (TID) | ORAL | Status: DC
  Administered 2023-01-24 – 2023-01-26 (×7): 20 mg via ORAL
  Filled 2023-01-24 (×7): qty 1

## 2023-01-24 MED ORDER — PANCRELIPASE (LIP-PROT-AMYL) 12000-38000 UNITS PO CPEP
24000.0000 [IU] | ORAL_CAPSULE | Freq: Three times a day (TID) | ORAL | Status: DC
  Administered 2023-01-24: 24000 [IU] via ORAL
  Filled 2023-01-24: qty 2

## 2023-01-24 MED ORDER — PANCRELIPASE (LIP-PROT-AMYL) 12000-38000 UNITS PO CPEP
36000.0000 [IU] | ORAL_CAPSULE | Freq: Three times a day (TID) | ORAL | Status: DC
  Administered 2023-01-24 – 2023-01-26 (×6): 36000 [IU] via ORAL
  Filled 2023-01-24 (×6): qty 3

## 2023-01-24 NOTE — Progress Notes (Signed)
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  Due to concerns for left-sided jaw pain, dyspnea on exertion, fatigue for anginal equivalent cardiology consulted.  Stool studies obtained.  Patient hydrated with IV fluids.  2D echo ordered.   Assessment & Plan:   Principal Problem:   Generalized weakness Active Problems:   AKI (acute kidney injury) (HCC)   CAD in native artery, with prior stents, and instent restenosis of small vessel, medical therapy, LAD and RCA stents are patent   HLD (hyperlipidemia)   DM (diabetes mellitus), type 2 (HCC) diet controlled   Hypokalemia   Essential hypertension   SOB (shortness of breath)   PAF (paroxysmal atrial fibrillation) (HCC)   Dysphagia   Hypothyroidism   Obesity (BMI 30.0-34.9)   Acute diarrhea   Dehydration   Other fatigue   Dyspnea  #1 generalized weakness/fatigue/shortness of breath on exertion/jaw pain/rule out anginal equivalent -Concern for probable anginal equivalent as patient states usually with his cardiac issues never has chest pain but does manifest his jaw and neck pain. -EKG done with new RBBB with inferior lateral T wave inversion noted. -Initial set of troponin negative. -Fasting lipid panel with LDL of 28. -2D echo ordered with a EF of 55 to 60%, grade 1 diastolic dysfunction, NWMA, normal right ventricular systolic function, trivial MVR.  -Continue home cardiac  regimen. -Hold Lotrel. -Patient seen in consultation by cardiology, currently asymptomatic this morning, cardiology recommended repletion of electrolytes, patient noted not to be jaw pain-free at rest and will assess on ambulation. -Per cardiology if 2D echo with no wall motion abnormalities and reassuring will need to be set up for outpatient PET versus Myoview and if patient with abnormal 2D echo or recurrence of jaw pain with ambulation despite improvement with dehydration and electrolyte abnormalities may need a cardiac catheterization. -Cardiology following and appreciate input and recommendations.   2.  Acute on chronic diarrhea -Patient with history of bile acid malabsorption, history of chronic diarrhea however states over the past 2 days has had significant increase in diarrhea which she describes as gallons of liquid malodorous stool. -C. difficile PCR pending.   -GI pathogen panel pending.   -CT abdomen and pelvis with no acute abnormalities.   -If GI pathogen panel is negative will place on a trial of Imodium.  -Continue home regimen colestipol, Creon, Bentyl.  -Supportive care.   3.  Acute kidney injury -Likely secondary to prerenal azotemia as patient presented with significant increased GI losses from acute on chronic diarrhea, in the setting of ACE inhibitor. -UA ordered and pending.  Urine studies pending.   -Renal function improved with hydration.  -Continue to hold ACE inhibitor. -CT abdomen and pelvis with no hydronephrosis.   -Urine output of 950 cc since admission. -IV fluids. -Supportive care.   4.  Hyperlipidemia -LDL of 28.  -Continue home regimen Lipitor and Zetia.   5.  Dehydration Continue IV fluids.     6.  Paroxysmal atrial fibrillation -Currently  normal sinus rhythm.   -Continue home regimen Coreg.  Eliquis for anticoagulation.   7.  Hypothyroidism - Synthroid.   8.  Hypokalemia -Likely secondary to GI losses. -Potassium at 3.2 this morning,  K-Dur 40 mEq p.o. every 4 hours x 2 doses.   -Magnesium level noted at 1.9.  -Resume home regimen oral potassium supplementation tomorrow. -Repeat labs in the AM.   9.  Diet-controlled diabetes mellitus type 2 -Hemoglobin A1c 6.1 (09/22/2020 ). -Repeat hemoglobin A1c pending.  -CBG 89 this morning.   10.  Hypertension -BP stable. -Continue to hold Lotrel. -Continue cardiac medication of Coreg, Imdur.   11.  Obesity -BMI 30.27 kg/m. -Lifestyle modification -Outpatient follow-up with PCP.   DVT prophylaxis: Eliquis Code Status: Full Family Communication: Updated patient.  No family at bedside. Disposition: Likely home once clinically improved, generalized weakness improved and cleared by cardiology hopefully in the next 1 to 2 days.  Status is: Observation The patient remains OBS appropriate and will d/c before 2 midnights.   Consultants:  Cardiology: Dr.Nishan 01/23/2023  Procedures:  CT abdomen and pelvis 01/23/2023 Chest x-ray 01/23/2023 2D echo 01/24/2023   Antimicrobials:  Anti-infectives (From admission, onward)    None         Subjective: Patient laying in bed.  Overall feeling a little bit better than on admission.  Some improvement with generalized weakness.  Denies any chest pain, jaw pain or neck pain currently at rest.  States has not ambulated to see whether pain returns.  Had 6 loose stools overnight and 4 loose stools already this morning.  Some complaint of diffuse abdominal discomfort.  Objective: Vitals:   01/23/23 2049 01/24/23 0030 01/24/23 0455 01/24/23 1132  BP: (!) 160/91 122/68 129/71 131/81  Pulse: 60 64 61 62  Resp: 18 18 18 20   Temp: 98.1 F (36.7 C) 98.1 F (36.7 C) 98 F (36.7 C) 97.9 F (36.6 C)  TempSrc: Oral Oral Oral Oral  SpO2: 99% 95% 96% 99%  Weight:   92.1 kg   Height:        Intake/Output Summary (Last 24 hours) at 01/24/2023 1506 Last data filed at 01/24/2023 0500 Gross per 24 hour  Intake 2546.86 ml  Output 951 ml   Net 1595.86 ml   Filed Weights   01/23/23 1313 01/23/23 1716 01/24/23 0455  Weight: 98.4 kg 94.4 kg 92.1 kg    Examination:  General exam: Appears calm and comfortable  Respiratory system: Clear to auscultation. Respiratory effort normal. Cardiovascular system: S1 & S2 heard, RRR. No JVD, murmurs, rubs, gallops or clicks. No pedal edema. Gastrointestinal system: Abdomen is nondistended, soft and some abdominal discomfort to palpation.  Positive bowel sounds.  No rebound.  No guarding.  Central nervous system: Alert and oriented. No focal neurological deficits. Extremities: Symmetric 5 x 5 power. Skin: No rashes, lesions or ulcers Psychiatry: Judgement and insight appear normal. Mood & affect appropriate.     Data Reviewed: I have personally reviewed following labs and imaging studies  CBC: Recent Labs  Lab 01/23/23 1316 01/24/23 0323  WBC 10.2 7.1  NEUTROABS  --  4.4  HGB 16.0 13.8  HCT 45.7 39.3  MCV 91.0 87.7  PLT 218 159    Basic Metabolic Panel: Recent Labs  Lab 01/23/23 1316 01/23/23 1528 01/24/23 0323  NA 136  --  141  K 3.2*  --  3.2*  CL 104  --  110  CO2 22  --  22  GLUCOSE 157*  --  93  BUN 25*  --  19  CREATININE 1.59*  --  1.03  CALCIUM 9.4  --  8.1*  MG  --  1.9  --     GFR: Estimated Creatinine Clearance: 87.4 mL/min (by C-G formula based on SCr of 1.03 mg/dL).  Liver Function Tests: Recent Labs  Lab 01/23/23 1528 01/24/23 0323  AST 26 20  ALT 33 29  ALKPHOS 82 74  BILITOT 1.7* 1.6*  PROT 6.7 6.0*  ALBUMIN 4.0 3.5    CBG: Recent Labs  Lab 01/24/23 0549  GLUCAP 89     Recent Results (from the past 240 hour(s))  C Difficile Quick Screen w PCR reflex     Status: None   Collection Time: 01/23/23  3:46 PM   Specimen: Stool  Result Value Ref Range Status   C Diff antigen NEGATIVE NEGATIVE Final   C Diff toxin NEGATIVE NEGATIVE Final   C Diff interpretation No C. difficile detected.  Final    Comment: Performed at [facility], [address] [place], [place] [address]         Radiology Studies: ECHOCARDIOGRAM COMPLETE  Result Date: 01/24/2023    ECHOCARDIOGRAM REPORT   Patient Name:   William Mcmahon Date of Exam: 01/24/2023 Medical Rec #:  [id number]      Height:       71.0 in Accession #:    [id number]     Weight:       203.1 lb Date of Birth:  1960-11-30      BSA:          2.122 m Patient Age:    62 years       BP:           129/71 mmHg Patient Gender: M              HR:           61 bpm. Exam Location:  Inpatient Procedure: 2D Echo, Cardiac Doppler, Color Doppler and 3D Echo Indications:    R94.31 Abnormal EKG  History:        Patient has prior history of Echocardiogram examinations, most                 recent 09/22/2020. CAD, Abnormal ECG, Arrythmias:Atrial                 Fibrillation, Signs/Symptoms:Dyspnea, Shortness of Breath, Chest                 Pain and Syncope; Risk Factors:Dyslipidemia, Diabetes and                 Hypertension.  Sonographer:    Sheralyn Boatman RDCS Referring Phys: [id number] Lyncoln Maskell V Adelis Docter IMPRESSIONS  1. Left ventricular ejection fraction, by estimation, is 55 to 60%. The left ventricle has normal function. The left ventricle has no regional wall motion abnormalities. Left ventricular diastolic parameters are consistent with Grade I diastolic dysfunction (impaired relaxation).  2. Right ventricular systolic function is normal. The right ventricular size is mildly enlarged. There is normal pulmonary artery systolic pressure. The estimated right ventricular systolic pressure is 23.6 mmHg.  3. The mitral valve is abnormal. Trivial mitral valve regurgitation.  4. The aortic valve is tricuspid. Aortic valve regurgitation is not visualized. No aortic stenosis is present.  5. The inferior vena cava is normal in size with greater than 50% respiratory variability, suggesting right atrial pressure of 3 mmHg. Comparison(s): No significant change from prior  study. 09/22/2020: LVEF 55-60%, grade 1 DD. FINDINGS   Left Ventricle: Left ventricular ejection fraction, by estimation, is 55 to 60%. The left ventricle has normal function. The left ventricle has no regional wall motion abnormalities. The left ventricular internal cavity size was normal in size. There is  no left ventricular hypertrophy. Left ventricular diastolic parameters are consistent with Grade I diastolic dysfunction (impaired relaxation). Indeterminate filling pressures. Right Ventricle: The right ventricular size is mildly enlarged. No increase in right ventricular wall thickness. Right ventricular systolic function is normal. There is normal pulmonary artery systolic pressure. The tricuspid regurgitant velocity is 2.27  m/s, and with an assumed right atrial pressure of 3 mmHg, the estimated right ventricular systolic pressure is 23.6 mmHg. Left Atrium: Left atrial size was normal in size. Right Atrium: Right atrial size was normal in size. Pericardium: There is no evidence of pericardial effusion. Mitral Valve: The mitral valve is abnormal. There is mild thickening of the mitral valve leaflet(s). Mild mitral annular calcification. Trivial mitral valve regurgitation. Tricuspid Valve: The tricuspid valve is normal in structure. Tricuspid valve regurgitation is trivial. Aortic Valve: The aortic valve is tricuspid. Aortic valve regurgitation is not visualized. No aortic stenosis is present. Pulmonic Valve: The pulmonic valve was grossly normal. Pulmonic valve regurgitation is trivial. Aorta: The aortic root and ascending aorta are structurally normal, with no evidence of dilitation. Venous: The inferior vena cava is normal in size with greater than 50% respiratory variability, suggesting right atrial pressure of 3 mmHg. IAS/Shunts: No atrial level shunt detected by color flow Doppler.  LEFT VENTRICLE PLAX 2D LVIDd:         4.40 cm      Diastology LVIDs:         2.90 cm      LV e' medial:    4.57 cm/s LV PW:         1.10 cm      LV E/e' medial:  12.2 LV IVS:         0.80 cm      LV e' lateral:   6.85 cm/s LVOT diam:     2.30 cm      LV E/e' lateral: 8.1 LV SV:         92 LV SV Index:   43 LVOT Area:     4.15 cm                              3D Volume EF: LV Volumes (MOD)            3D EF:        50 % LV vol d, MOD A2C: 116.0 ml LV EDV:       148 ml LV vol d, MOD A4C: 136.0 ml LV ESV:       74 ml LV vol s, MOD A2C: 41.2 ml  LV SV:        74 ml LV vol s, MOD A4C: 55.2 ml LV SV MOD A2C:     74.8 ml LV SV MOD A4C:     136.0 ml LV SV MOD BP:      77.1 ml RIGHT VENTRICLE             IVC RV S prime:     18.30 cm/s  IVC diam: 2.00 cm TAPSE (M-mode): 2.8 cm LEFT ATRIUM             Index  RIGHT ATRIUM           Index LA diam:        4.50 cm 2.12 cm/m   RA Area:     17.80 cm LA Vol (A2C):   59.8 ml 28.18 ml/m  RA Volume:   48.40 ml  22.80 ml/m LA Vol (A4C):   48.5 ml 22.85 ml/m LA Biplane Vol: 55.6 ml 26.20 ml/m  AORTIC VALVE LVOT Vmax:   99.80 cm/s LVOT Vmean:  64.900 cm/s LVOT VTI:    0.221 m  AORTA Ao Root diam: 3.40 cm Ao Asc diam:  3.20 cm MITRAL VALVE               TRICUSPID VALVE MV Area (PHT): 2.56 cm    TR Peak grad:   20.6 mmHg MV Decel Time: 296 msec    TR Vmax:        227.00 cm/s MV E velocity: 55.80 cm/s MV A velocity: 86.40 cm/s  SHUNTS MV E/A ratio:  0.65        Systemic VTI:  0.22 m                            Systemic Diam: 2.30 cm Zoila Shutter MD Electronically signed by Zoila Shutter MD Signature Date/Time: 01/24/2023/1:46:36 PM    Final    CT ABDOMEN PELVIS WO CONTRAST  Result Date: 01/23/2023 CLINICAL DATA:  Diarrhea, fatigue EXAM: CT ABDOMEN AND PELVIS WITHOUT CONTRAST TECHNIQUE: Multidetector CT imaging of the abdomen and pelvis was performed following the standard protocol without IV contrast. RADIATION DOSE REDUCTION: This exam was performed according to the departmental dose-optimization program which includes automated exposure control, adjustment of the mA and/or kV according to patient size and/or use of iterative reconstruction technique.  COMPARISON:  None Available. FINDINGS: Lower chest: Coronary artery and aortic calcifications. Linear scarring or atelectasis in the left base. No effusions. Hepatobiliary: No focal liver abnormality is seen. Status post cholecystectomy. No biliary dilatation. Pancreas: No focal abnormality or ductal dilatation. Spleen: No focal abnormality.  Normal size. Adrenals/Urinary Tract: Large cyst off the lower pole of the right kidney measures up to 9.3 cm. This appears simple. No follow-up imaging recommended. Punctate nonobstructing stone in the lower pole of the right kidney. No hydronephrosis. Adrenal glands and urinary bladder unremarkable. Stomach/Bowel: Normal appendix. Left colonic diverticulosis. No active diverticulitis. Stomach and small bowel decompressed, unremarkable. Vascular/Lymphatic: Heavily calcified aorta and iliac vessels. No evidence of aneurysm or adenopathy. Reproductive: No visible focal abnormality. Other: No free fluid or free air. Musculoskeletal: No acute bony abnormality. IMPRESSION: No acute findings in the abdomen or pelvis. Right lower pole punctate nephrolithiasis.  No hydronephrosis. Aortic atherosclerosis. Left colonic diverticulosis.  No active diverticulitis. Electronically Signed   By: Charlett Nose M.D.   On: 01/23/2023 21:25   DG Chest 2 View  Result Date: 01/23/2023 CLINICAL DATA:  Chest pain. EXAM: CHEST - 2 VIEW COMPARISON:  Chest two views 03/30/2021 FINDINGS: Cardiac silhouette and mediastinal contours are within normal limits. Mild calcification within aortic arch. Mildly decreased lung volumes. The lungs are clear. No pleural effusion pneumothorax. Mild multilevel degenerative disc changes of the thoracic spine. Cholecystectomy clips. IMPRESSION: No active cardiopulmonary disease. Electronically Signed   By: Neita Garnet M.D.   On: 01/23/2023 14:37        Scheduled Meds:  apixaban  5 mg Oral BID   atorvastatin  80 mg Oral Daily   carvedilol  12.5  mg Oral BID WC    clopidogrel  75 mg Oral Daily   colestipol  1 g Oral BID   dicyclomine  20 mg Oral TID AC   ezetimibe  10 mg Oral Daily   isosorbide mononitrate  60 mg Oral Daily   levothyroxine  137 mcg Oral Q0600   lipase/protease/amylase  36,000 Units Oral TID AC   [START ON 01/25/2023] potassium chloride  10 mEq Oral Daily   potassium chloride  40 mEq Oral Q4H   sodium chloride flush  3 mL Intravenous Q12H   Continuous Infusions:  sodium chloride 125 mL/hr at 01/24/23 1048     LOS: 0 days    Time spent: 35 minutes    Ramiro Harvest, MD [facility]   To contact the attending provider between 7A-7P or the covering provider during after hours 7P-7A, please log into the web site [email or web address] and access using universal [place] password for that web site. If you do not have the password, please call the hospital operator.  01/24/2023, 3:06 PM

## 2023-01-24 NOTE — Evaluation (Signed)
Occupational Therapy Evaluation Patient Details Name: William Mcmahon MRN: [id number] DOB: [birth year] Today's Date: 01/24/2023   History of Present Illness William Mcmahon is a 62 y.o. male with medical history significant of CAD status post stent placement to RCA and LAD, last cath 09/11/2018 with patent stents, small vessel disease and subtotal small IM and ostial PDA with medical treatment recommended, history of paroxysmal atrial fibrillation on Eliquis, hyperlipidemia, hypertension, hypothyroidism, malabsorptive disorder per patient called bile acid malabsorption with chronic diarrhea presenting to the ED with a 2-day history of increasing fatigue, dyspnea on exertion, left-sided jaw pain and discomfort on exertion   Clinical Impression   Pt is presents at Ind baseline level of function with ADLs and ADL mobility/walking with no AD required and no LOB. Pt with no c/o pain, dizziness or SOB during exertion/activity.  Pt able to transfer on and off toilet, step in and out of shower and simulate LB bathing while standing with VS WNL. No further acute OT services are indicated at this time, OT will sign off     Recommendations for follow up therapy are one component of a multi-disciplinary discharge planning process, led by the attending physician.  Recommendations may be updated based on patient status, additional functional criteria and insurance authorization.   Assistance Recommended at Discharge None  Patient can return home with the following      Functional Status Assessment     Equipment Recommendations  None recommended by OT    Recommendations for Other Services       Precautions / Restrictions Precautions Precautions: None Restrictions Weight Bearing Restrictions: No      Mobility Bed Mobility Overal bed mobility: Independent                  Transfers Overall transfer level: Independent Equipment used: None                      Balance Overall  balance assessment: No apparent balance deficits (not formally assessed)                                         ADL either performed or assessed with clinical judgement   ADL Overall ADL's : Independent;At baseline                                             Vision Baseline Vision/History: 1 Wears glasses (readers) Ability to See in Adequate Light: 3 Highly impaired       Perception     Praxis      Pertinent Vitals/Pain Pain Assessment Pain Assessment: No/denies pain     Hand Dominance Left   Extremity/Trunk Assessment Upper Extremity Assessment Upper Extremity Assessment: Overall WFL for tasks assessed;RUE deficits/detail RUE Deficits / Details: 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, flexion and ADB WFL   Lower Extremity Assessment Lower Extremity Assessment: Defer to PT evaluation   Cervical / Trunk Assessment Cervical / Trunk Assessment: Normal   Communication Communication Communication: No difficulties   Cognition Arousal/Alertness: Awake/alert Behavior During Therapy: WFL for tasks assessed/performed Overall Cognitive Status: Within Functional Limits for tasks assessed  General Comments       Exercises     Shoulder Instructions      Home Living Family/patient expects to be discharged to:: Private residence Living Arrangements: Spouse/significant other Available Help at Discharge: Family;Available 24 hours/day Type of Home: House Home Access: Stairs to enter [employer] of Steps: 3 Entrance Stairs-Rails: Right Home Layout: One level     Bathroom Shower/Tub: Producer, television/film/video: Standard Bathroom Accessibility: Yes   Home Equipment: None          Prior Functioning/Environment Prior Level of Function : Independent/Modified Independent;Driving             Mobility Comments: Ind ADLs Comments: Ind with ADLs, IADLs, drives         OT Problem List: Decreased activity tolerance      OT Treatment/Interventions:      OT Goals(Current goals can be found in the care plan section)    OT Frequency:      Co-evaluation              AM-PAC OT "6 Clicks" Daily Activity     Outcome Measure Help from another person eating meals?: None Help from another person taking care of personal grooming?: None Help from another person toileting, which includes using toliet, bedpan, or urinal?: None Help from another person bathing (including washing, rinsing, drying)?: None Help from another person to put on and taking off regular upper body clothing?: None Help from another person to put on and taking off regular lower body clothing?: None 6 Click Score: 24   End of Session Equipment Utilized During Treatment: Gait belt  Activity Tolerance: Patient tolerated treatment well Patient left: in chair;with nursing/sitter in room  OT Visit Diagnosis: Muscle weakness (generalized) (M62.81)                Time: 0981-1914 OT Time Calculation (min): 23 min Charges:  OT General Charges $OT Visit: 1 Visit OT Evaluation $OT Eval Low Complexity: 1 Low    Galen Manila 01/24/2023, 11:38 AM

## 2023-01-24 NOTE — Progress Notes (Signed)
  Echocardiogram 2D Echocardiogram has been performed.  Janalyn Harder 01/24/2023, 1:01 PM

## 2023-01-24 NOTE — Progress Notes (Signed)
PT alert and oriented, no s/s distress. Able to make needs known. Reviewed home medications this morning and clarified medication dosing. PT's wife brought home meds in for pharmacy review. Pharmacy took medications and per wife, has returned them to her to take home. PT denies pain. Eating well. Denies nausea. Using call bell appropriately.

## 2023-01-24 NOTE — Evaluation (Signed)
Physical Therapy Evaluation Patient Details Name: William Mcmahon MRN: [id number] DOB: February 14, 1961 Today's Date: 01/24/2023  History of Present Illness  William Mcmahon is a 62 y.o. 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 Co treat with OT. Pt was able to ambulate in hallway with no AD and navigate stairs independently. Pt presents at or near baseline mobility. Pt has no further acute PT needs and will be signing off. Re consult PT if mobility status changes.        Recommendations for follow up therapy are one component of a multi-disciplinary discharge planning process, led by the attending physician.  Recommendations may be updated based on patient status, additional functional criteria and insurance authorization.  Follow Up Recommendations       Assistance Recommended at Discharge PRN  Patient can return home with the following       Equipment Recommendations None recommended by PT  Recommendations for Other Services       Functional Status Assessment Patient has had a recent decline in their functional status and demonstrates the ability to make significant improvements in function in a reasonable and predictable amount of time.     Precautions / Restrictions Precautions Precautions: None Restrictions Weight Bearing Restrictions: No      Mobility  Bed Mobility Overal bed mobility: Independent                   Transfers Overall transfer level: Independent Equipment used: None                    Ambulation/Gait Ambulation/Gait assistance: Modified independent (Device/Increase time) Gait Distance (Feet): 350 Feet Assistive device: IV Pole Gait Pattern/deviations: WFL(Within Functional Limits)   Gait velocity interpretation: >2.62 ft/sec, indicative of community ambulatory   General Gait Details: Pt with steady gait pattern overall. no LOB noted  Stairs Stairs: Yes Stairs assistance: Independent Stair Management: One rail Right, Alternating pattern, Forwards Number of Stairs: 4 General stair comments: no LOB noted. Distance limited due to IV pole.  Wheelchair Mobility    Modified Rankin (Stroke Patients Only)       Balance Overall balance assessment: No apparent balance deficits (not formally assessed)                                           Pertinent Vitals/Pain Pain Assessment Pain Assessment: No/denies pain    Home Living Family/patient expects to be discharged to:: Private residence Living Arrangements: Spouse/significant other Available Help at Discharge: Family;Available 24 hours/day Type of Home: House Home Access: Stairs to enter Entrance Stairs-Rails: Right Entrance Stairs-Number of Steps: 3   Home Layout: One level Home Equipment: None      Prior Function Prior Level of Function : Independent/Modified Independent;Driving             Mobility Comments: Ind ADLs Comments: Ind     Hand Dominance   Dominant Hand: Left  Extremity/Trunk Assessment   Upper Extremity Assessment Upper Extremity Assessment: Overall WFL for tasks assessed RUE Deficits / Details: 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, flexion and ADB WFL    Lower Extremity Assessment Lower Extremity Assessment: Overall WFL for tasks assessed    Cervical / Trunk Assessment Cervical / Trunk Assessment: Normal  Communication   Communication: No difficulties   Cognition Arousal/Alertness: Awake/alert Behavior During Therapy: WFL for tasks assessed/performed Overall Cognitive Status: Within Functional Limits for tasks assessed                                          General Comments General comments (skin integrity, edema, etc.): VSS on RA    Exercises     Assessment/Plan    PT Assessment Patient does not need any further PT services  PT Problem List         PT Treatment Interventions      PT Goals (Current goals can be found in the Care Plan section)       Frequency       Co-evaluation PT/OT/SLP Co-Evaluation/Treatment: Yes Reason for Co-Treatment: For patient/therapist safety;To address functional/ADL transfers PT goals addressed during session: Mobility/safety with mobility OT goals addressed during session: ADL's and self-care       AM-PAC PT "6 Clicks" Mobility  Outcome Measure Help needed turning from your back to your side while in a flat bed without using bedrails?: None Help needed moving from lying on your back to sitting on the side of a flat bed without using bedrails?: None Help needed moving to and from a bed to a chair (including a wheelchair)?: None Help needed standing up from a chair using your arms (e.g., wheelchair or bedside chair)?: None Help needed to walk in hospital room?: None Help needed climbing 3-5 steps with a railing? : None 6 Click Score: 24    End of Session Equipment Utilized During Treatment: Gait belt Activity Tolerance: Patient tolerated treatment well Patient left: in chair;with call bell/phone within reach;with chair alarm set;with nursing/sitter in room Nurse Communication: Mobility status PT Visit Diagnosis: Other abnormalities of gait and mobility (R26.89)    Time: 4782-9562 PT Time Calculation (min) (ACUTE ONLY): 23 min   Charges:   PT Evaluation $PT Eval Low Complexity: 1 Low PT Treatments $Gait Training: 8-22 mins        Shela Nevin, PT, DPT Acute  Rehab Services [phone]   Gladys Damme 01/24/2023, 12:22 PM

## 2023-01-24 NOTE — Progress Notes (Addendum)
Rounding Note    Patient Name: William Mcmahon Date of Encounter: 01/24/2023  [facility] Cardiologist: Rollene Rotunda, MD   Subjective   Continues to have diarrhea. No chest/jaw pain  Inpatient Medications    Scheduled Meds:  apixaban  5 mg Oral BID   atorvastatin  80 mg Oral Daily   carvedilol  12.5 mg Oral BID WC   clopidogrel  75 mg Oral Daily   colestipol  1 g Oral BID   ezetimibe  10 mg Oral Daily   isosorbide mononitrate  60 mg Oral Daily   levothyroxine  137 mcg Oral Q0600   lipase/protease/amylase  36,000 Units Oral TID WC   [START ON 01/25/2023] potassium chloride  10 mEq Oral Daily   potassium chloride  40 mEq Oral Q4H   sodium chloride flush  3 mL Intravenous Q12H   Continuous Infusions:  sodium chloride 125 mL/hr at 01/24/23 0213   PRN Meds: acetaminophen **OR** acetaminophen, hydrALAZINE, ondansetron **OR** ondansetron (ZOFRAN) IV, oxyCODONE   Vital Signs    Vitals:   01/23/23 1716 01/23/23 2049 01/24/23 0030 01/24/23 0455  BP: 132/75 (!) 160/91 122/68 129/71  Pulse: (!) 58 60 64 61  Resp: 18 18 18 18   Temp: 97.8 F (36.6 C) 98.1 F (36.7 C) 98.1 F (36.7 C) 98 F (36.7 C)  TempSrc: Oral Oral Oral Oral  SpO2: 97% 99% 95% 96%  Weight: 94.4 kg   92.1 kg  Height: 5\' 11"  (1.803 m)       Intake/Output Summary (Last 24 hours) at 01/24/2023 0820 Last data filed at 01/24/2023 0500 Gross per 24 hour  Intake 2546.86 ml  Output 951 ml  Net 1595.86 ml      01/24/2023    4:55 AM 01/23/2023    5:16 PM 01/23/2023    1:13 PM  Last 3 Weights  Weight (lbs) 203 lb 1.6 oz 208 lb 1.8 oz 217 lb  Weight (kg) 92.126 kg 94.4 kg 98.431 kg      Telemetry    NSR - Personally Reviewed  ECG    NSR, RBBB (new), TWI inferior leads - Personally Reviewed  Physical Exam   GEN: No acute distress.   Neck: No JVD Cardiac: RRR, no murmurs, rubs, or gallops.  Respiratory: Clear to auscultation bilaterally. GI: Soft, nontender, non-distended  MS: No  edema; No deformity. Neuro:  Nonfocal  Psych: Normal affect   Labs    High Sensitivity Troponin:   Recent Labs  Lab 01/23/23 1316 01/23/23 1528  TROPONINIHS 13 11     Chemistry Recent Labs  Lab 01/23/23 1316 01/23/23 1528 01/24/23 0323  NA 136  --  141  K 3.2*  --  3.2*  CL 104  --  110  CO2 22  --  22  GLUCOSE 157*  --  93  BUN 25*  --  19  CREATININE 1.59*  --  1.03  CALCIUM 9.4  --  8.1*  MG  --  1.9  --   PROT  --  6.7 6.0*  ALBUMIN  --  4.0 3.5  AST  --  26 20  ALT  --  33 29  ALKPHOS  --  82 74  BILITOT  --  1.7* 1.6*  GFRNONAA 49*  --  >60  ANIONGAP 10  --  9    Lipids  Recent Labs  Lab 01/24/23 0323  CHOL 60  TRIG 76  HDL 17*  LDLCALC 28  CHOLHDL 3.5  Hematology Recent Labs  Lab 01/23/23 1316 01/24/23 0323  WBC 10.2 7.1  RBC 5.02 4.48  HGB 16.0 13.8  HCT 45.7 39.3  MCV 91.0 87.7  MCH 31.9 30.8  MCHC 35.0 35.1  RDW 11.9 11.9  PLT 218 159   Thyroid No results for input(s): "TSH", "FREET4" in the last 168 hours.  BNP Recent Labs  Lab 01/23/23 1322  BNP 16.5    DDimer No results for input(s): "DDIMER" in the last 168 hours.   Radiology    CT ABDOMEN PELVIS WO CONTRAST  Result Date: 01/23/2023 CLINICAL DATA:  Diarrhea, fatigue EXAM: CT ABDOMEN AND PELVIS WITHOUT CONTRAST TECHNIQUE: Multidetector CT imaging of the abdomen and pelvis was performed following the standard protocol without IV contrast. RADIATION DOSE REDUCTION: This exam was performed according to the departmental dose-optimization program which includes automated exposure control, adjustment of the mA and/or kV according to patient size and/or use of iterative reconstruction technique. COMPARISON:  None Available. FINDINGS: Lower chest: Coronary artery and aortic calcifications. Linear scarring or atelectasis in the left base. No effusions. Hepatobiliary: No focal liver abnormality is seen. Status post cholecystectomy. No biliary dilatation. Pancreas: No focal abnormality or  ductal dilatation. Spleen: No focal abnormality.  Normal size. Adrenals/Urinary Tract: Large cyst off the lower pole of the right kidney measures up to 9.3 cm. This appears simple. No follow-up imaging recommended. Punctate nonobstructing stone in the lower pole of the right kidney. No hydronephrosis. Adrenal glands and urinary bladder unremarkable. Stomach/Bowel: Normal appendix. Left colonic diverticulosis. No active diverticulitis. Stomach and small bowel decompressed, unremarkable. Vascular/Lymphatic: Heavily calcified aorta and iliac vessels. No evidence of aneurysm or adenopathy. Reproductive: No visible focal abnormality. Other: No free fluid or free air. Musculoskeletal: No acute bony abnormality. IMPRESSION: No acute findings in the abdomen or pelvis. Right lower pole punctate nephrolithiasis.  No hydronephrosis. Aortic atherosclerosis. Left colonic diverticulosis.  No active diverticulitis. Electronically Signed   By: Charlett Nose M.D.   On: 01/23/2023 21:25   DG Chest 2 View  Result Date: 01/23/2023 CLINICAL DATA:  Chest pain. EXAM: CHEST - 2 VIEW COMPARISON:  Chest two views 03/30/2021 FINDINGS: Cardiac silhouette and mediastinal contours are within normal limits. Mild calcification within aortic arch. Mildly decreased lung volumes. The lungs are clear. No pleural effusion pneumothorax. Mild multilevel degenerative disc changes of the thoracic spine. Cholecystectomy clips. IMPRESSION: No active cardiopulmonary disease. Electronically Signed   By: Neita Garnet M.D.   On: 01/23/2023 14:37    Cardiac Studies   Cardiac Studies & Procedures   CARDIAC CATHETERIZATION  CARDIAC CATHETERIZATION 09/11/2018  Narrative Images from the original result were not included.   The left ventricular systolic function is normal. The left ventricular ejection fraction is 55-65% by visual estimate. LV end diastolic pressure is normal.  RCAl Ost RCA to Mid RCA overlapping stents (proximal from 2013, mid from  2019) -- 5% stenosed.  --- Mid RCA to Dist RCA lesion is 30% stenosed with 30% stenosed side branch in Acute Mrg.  --- Ost RPDA lesion is 75% stenosed. 2nd RPLB lesion is 70% stenosed.  LAD: Prox LAD stent (Xience 3.25 mm x 18 mm-2018) is 5% stenosed.  -- Prox LAD to Mid LAD lesion is 55% stenosed - after D1.  Ost Ramus lesion is 99% stenosed. Ost Ramus to Ramus stent (Xience 2.25 mm x 15 mm-2018) is 65% stenosed.  Mid Cx to Dist Cx lesion is 50% stenosed. Dist Cx lesion is 60% stenosed.  SUMMARY  Culprit lesion  is likely the severe in-stent restenosis and near occlusion of the proximal diagonal-ramus intermedius that was previously treated with a DES stent --> not a viable option for re-PCI as I cannot find the ostium of this vessel.  Patent LAD and RCA stents.  Diffuse mild disease in the RCA with previously reported roughly 80% ostial PDA.  Diffuse mild to moderate disease in the mid LAD with roughly long segment of 50% after major diagonal branch.  Tandem moderate lesions in major OM branch.  Normal LVEF and normal EDP.  Films reviewed with Dr. Excell Seltzer, we both felt that the small ramus intermedius branch is not viable for reattempted PCI.  The other remaining lesions are moderate and we would be best served treated medically. Recommendation would be to continue to manage medically per primary team.  RECOMMENDATION  Return to nursing unit with ongoing care.  TR band removal per protocol.  Anticipate discharge tomorrow morning based on the late case.  Continue aggressive risk factor modification  Continue titrating antianginal medications -would consider Ranexa and or Imdur as he is already on good doses of carvedilol and amlodipine.    Bryan Lemma, M.D., M.S. Interventional Cardiologist  Pager # [phone] Phone # [phone] [address] [place], [place] [address]  Findings Coronary Findings Diagnostic  Dominance: Right  Left Main Vessel is  large.  Left Anterior Descending There is mild diffuse disease throughout the vessel. Prox LAD lesion is 5% stenosed. The lesion was previously treated using a drug eluting stent between 1-2 years ago. Xience 3.25 x 18  2018 Prox LAD to Mid LAD lesion is 55% stenosed. The lesion is segmental and irregular.  First Diagonal Branch The vessel exhibits minimal luminal irregularities.  First Septal Branch Vessel is small in size. Small-moderate  Second Diagonal Branch Vessel is small in size.  Second Septal Branch Vessel is small in size.  Third Diagonal Branch Vessel is small in size.  Ramus Intermedius Vessel is small. Ost Ramus lesion is 99% stenosed. The lesion is such that the ostial take-off cannot be seen. Ost Ramus to Ramus lesion is 65% stenosed. The lesion was previously treated using a drug eluting stent between 1-2 years ago. Xience 2.25 x 15 Previously placed stent displays restenosis.  Left Circumflex Vessel is large. The vessel is tortuous. Mid Cx to Dist Cx lesion is 50% stenosed. The lesion is located at the major branch, focal and eccentric. Dist Cx lesion is 60% stenosed. The lesion is located at the bend, focal and eccentric.  First Obtuse Marginal Branch Vessel is small in size.  Second Obtuse Marginal Branch Vessel is small in size.  Right Coronary Artery There is mild diffuse disease throughout the vessel. Ost RCA to Mid RCA lesion is 5% stenosed. The lesion was previously treated using a stent (unknown type) between 1-2 years ago. 2 overlapping stents - Ost-Prox (2013), mid 2019. Mid RCA to Dist RCA lesion is 30% stenosed with 30% stenosed side branch in Acute Mrg.  Right Posterior Descending Artery Ost RPDA lesion is 75% stenosed.  Inferior Septal Vessel is small in size.  First Right Posterolateral Branch Vessel is small in size.  Second Right Posterolateral Branch Vessel is small in size. 2nd RPLB lesion is 70%  stenosed.  Intervention  No interventions have been documented.     ECHOCARDIOGRAM  ECHOCARDIOGRAM COMPLETE 09/22/2020  Narrative ECHOCARDIOGRAM REPORT    Patient Name:   PACE Pelzer Date of Exam: 09/22/2020 Medical Rec #:  [id number]  Height:       70.0 in Accession #:    [id number]     Weight:       250.0 lb Date of Birth:  September 06, 1960      BSA:          2.295 m Patient Age:    62 years       BP:           145/85 mmHg Patient Gender: M              HR:           57 bpm. Exam Location:  Inpatient  Procedure: 2D Echo, Cardiac Doppler and Color Doppler  Indications:    Atrial Fibrillation I48.91  History:        Patient has prior history of Echocardiogram examinations, most recent 09/11/2018. CAD; Risk Factors:Hypertension, Diabetes, Dyslipidemia and Former Smoker.  Sonographer:    Renella Cunas RDCS Referring Phys: [id number] Corrin Parker   Sonographer Comments: Image acquisition challenging due to respiratory motion. IMPRESSIONS   1. Left ventricular ejection fraction, by estimation, is 55 to 60%. The left ventricle has normal function. The left ventricle has no regional wall motion abnormalities. Left ventricular diastolic parameters are consistent with Grade I diastolic dysfunction (impaired relaxation). 2. Right ventricular systolic function is normal. The right ventricular size is mildly enlarged. There is normal pulmonary artery systolic pressure. The estimated right ventricular systolic pressure is 21.5 mmHg. 3. The mitral valve is abnormal. Trivial mitral valve regurgitation. 4. The aortic valve is normal in structure. There is mild thickening of the aortic valve. Aortic valve regurgitation is not visualized. 5. The inferior vena cava is normal in size with greater than 50% respiratory variability, suggesting right atrial pressure of 3 mmHg.  Comparison(s): No significant change from prior study.  FINDINGS Left Ventricle: Left ventricular ejection fraction,  by estimation, is 55 to 60%. The left ventricle has normal function. The left ventricle has no regional wall motion abnormalities. The left ventricular internal cavity size was normal in size. There is no left ventricular hypertrophy. Left ventricular diastolic parameters are consistent with Grade I diastolic dysfunction (impaired relaxation).  Right Ventricle: The right ventricular size is mildly enlarged. No increase in right ventricular wall thickness. Right ventricular systolic function is normal. There is normal pulmonary artery systolic pressure. The tricuspid regurgitant velocity is 2.15 m/s, and with an assumed right atrial pressure of 3 mmHg, the estimated right ventricular systolic pressure is 21.5 mmHg.  Left Atrium: Left atrial size was normal in size.  Right Atrium: Right atrial size was normal in size.  Pericardium: There is no evidence of pericardial effusion.  Mitral Valve: The mitral valve is abnormal. There is mild thickening of the mitral valve leaflet(s). There is mild calcification of the mitral valve leaflet(s). Trivial mitral valve regurgitation.  Tricuspid Valve: The tricuspid valve is normal in structure. Tricuspid valve regurgitation is trivial.  Aortic Valve: The aortic valve is normal in structure. There is mild thickening of the aortic valve. Aortic valve regurgitation is not visualized.  Pulmonic Valve: The pulmonic valve was normal in structure. Pulmonic valve regurgitation is trivial.  Aorta: The aortic root is normal in size and structure.  Venous: The inferior vena cava is normal in size with greater than 50% respiratory variability, suggesting right atrial pressure of 3 mmHg.  IAS/Shunts: No atrial level shunt detected by color flow Doppler.   LEFT VENTRICLE PLAX 2D LVIDd:  5.30 cm      Diastology LVIDs:         3.30 cm      LV e' medial:    3.75 cm/s LV PW:         0.90 cm      LV E/e' medial:  16.5 LV IVS:        0.90 cm      LV e' lateral:    5.43 cm/s LVOT diam:     2.30 cm      LV E/e' lateral: 11.4 LV SV:         73 LV SV Index:   32 LVOT Area:     4.15 cm  LV Volumes (MOD) LV vol d, MOD A2C: 108.0 ml LV vol d, MOD A4C: 128.0 ml LV vol s, MOD A2C: 43.3 ml LV vol s, MOD A4C: 53.9 ml LV SV MOD A2C:     64.7 ml LV SV MOD A4C:     128.0 ml LV SV MOD BP:      69.5 ml  RIGHT VENTRICLE RV S prime:     15.60 cm/s TAPSE (M-mode): 2.2 cm  LEFT ATRIUM             Index       RIGHT ATRIUM           Index LA diam:        4.70 cm 2.05 cm/m  RA Area:     15.50 cm LA Vol (A2C):   30.4 ml 13.25 ml/m RA Volume:   42.50 ml  18.52 ml/m LA Vol (A4C):   43.3 ml 18.87 ml/m LA Biplane Vol: 36.4 ml 15.86 ml/m AORTIC VALVE LVOT Vmax:   76.20 cm/s LVOT Vmean:  48.400 cm/s LVOT VTI:    0.175 m  AORTA Ao Root diam: 3.30 cm  MITRAL VALVE               TRICUSPID VALVE MV Area (PHT): 3.48 cm    TR Peak grad:   18.5 mmHg MV Decel Time: 218 msec    TR Vmax:        215.00 cm/s MV E velocity: 62.00 cm/s MV A velocity: 70.30 cm/s  SHUNTS MV E/A ratio:  0.88        Systemic VTI:  0.18 m Systemic Diam: 2.30 cm  Laurance Flatten MD Electronically signed by Laurance Flatten MD Signature Date/Time: 09/22/2020/2:59:01 PM    Final    MONITORS  LONG TERM MONITOR (3-14 DAYS) 12/24/2018  Narrative NSR Rare nonsustained atrial tachycardia longest run five beats PACs Rare PVCs No sustained arrhythmias.            Patient Profile     62 y.o. male with history of CAD s/p PCI to RCA and LAD, pAfib on apixaban, HTN, HLD, DMII, and bile acid malabsorption with chronic diarrhea who presented with worsening fatigue, DOE, and left jaw pain for which Cardiology was consulted.  Assessment & Plan    #Jaw Pain: #Known CAD with prior LAD and RCA PCI: -Patient with known CAD with last cath in 2020 patent ostial-mid RCA stents, patent prox LAD stent, 55% mild LAD, 75% ostial RPDA, severe in stent restenosis of prox diag-ramus stent which  was managed medically as not a PCI candidate -Presented on this admission with weakness jaw/neck pain (anginal equivalent) that developed in the setting of dehydration from severe diarrhea -Prior to the development of severe diarrhea/dehydration, the patient was able to walk 5 miles/day  without anginal symptoms (last able to walk 5 miles on 5/24 before onset of diarrhea on 5/25) -On admission, Trop negative 13>11; ECG with NSR, RBBB, TWI in inferior leads (RBBB new from prior) -Received IVF and K repletion and patient is jaw pain free and comfortable -Overall, likely developed anginal symptoms in the setting of dehydration/hypokalemia secondary to diarrhea in the setting of known CAD -Given he is now jaw pain free and comfortable, will await results of TTE and can likely plan for outpatient PET vs myoview unless WMA on echo or recurrence of jaw pain with ambulation now that dehydration and hypokalemia improved -Continue plavix 75mg  daily (has been maintained on apixaban/plavix as outpatient) -Continue lipitor 80mg  daily -Continue zetia 10mg  daily -Continue imdur 60mg  daily  #Paroxysmal Afib: -Maintaining NSR -Continue apixaban 5mg  BID -Continue coreg 12.5mg  BID  #HLD: -Continue lipitor 80mg  daily -Continue zetia 10mg  daily -LDL very well controlled at 28  #Diarrhea: #Bile Acid Malabsorption: #Dehydration: -C. Diff negative -Continue management per primary  #AKI: -Likely due to dehydration in the setting of diarrhea -Now improved s/p IVF   Plan to follow-up TTE and if WMA or recurrence of jaw pain with ambulation despite improvement in dehydration/hypoK, will need cath. If above reassuring, can plan for outpatient PET vs myoview.    For questions or updates, please contact [facility] Please consult [email or web address] for contact info under        Signed, Meriam Sprague, MD  01/24/2023, 8:20 AM

## 2023-01-24 NOTE — Plan of Care (Signed)
  Problem: Education: Goal: Knowledge of General Education information will improve Description: Including pain rating scale, medication(s)/side effects and non-pharmacologic comfort measures 01/24/2023 1802 by Jeraldine Loots, RN Outcome: Progressing 01/24/2023 1802 by Jeraldine Loots, RN Outcome: Progressing   Problem: Education: Goal: Ability to demonstrate management of disease process will improve Outcome: Progressing Goal: Ability to verbalize understanding of medication therapies will improve Outcome: Progressing Goal: Individualized Educational Video(s) Outcome: Progressing   Problem: Activity: Goal: Capacity to carry out activities will improve Outcome: Progressing   Problem: Cardiac: Goal: Ability to achieve and maintain adequate cardiopulmonary perfusion will improve Outcome: Progressing

## 2023-01-25 DIAGNOSIS — R531 Weakness: Secondary | ICD-10-CM | POA: Diagnosis present

## 2023-01-25 DIAGNOSIS — Z8249 Family history of ischemic heart disease and other diseases of the circulatory system: Secondary | ICD-10-CM | POA: Diagnosis not present

## 2023-01-25 DIAGNOSIS — R6884 Jaw pain: Secondary | ICD-10-CM

## 2023-01-25 DIAGNOSIS — E782 Mixed hyperlipidemia: Secondary | ICD-10-CM | POA: Diagnosis not present

## 2023-01-25 DIAGNOSIS — Z7901 Long term (current) use of anticoagulants: Secondary | ICD-10-CM | POA: Diagnosis not present

## 2023-01-25 DIAGNOSIS — Z955 Presence of coronary angioplasty implant and graft: Secondary | ICD-10-CM | POA: Diagnosis not present

## 2023-01-25 DIAGNOSIS — I480 Paroxysmal atrial fibrillation: Secondary | ICD-10-CM | POA: Diagnosis present

## 2023-01-25 DIAGNOSIS — E785 Hyperlipidemia, unspecified: Secondary | ICD-10-CM | POA: Diagnosis present

## 2023-01-25 DIAGNOSIS — R1310 Dysphagia, unspecified: Secondary | ICD-10-CM | POA: Diagnosis present

## 2023-01-25 DIAGNOSIS — E669 Obesity, unspecified: Secondary | ICD-10-CM | POA: Diagnosis present

## 2023-01-25 DIAGNOSIS — Z6830 Body mass index (BMI) 30.0-30.9, adult: Secondary | ICD-10-CM | POA: Diagnosis not present

## 2023-01-25 DIAGNOSIS — A040 Enteropathogenic Escherichia coli infection: Secondary | ICD-10-CM | POA: Diagnosis present

## 2023-01-25 DIAGNOSIS — Z833 Family history of diabetes mellitus: Secondary | ICD-10-CM | POA: Diagnosis not present

## 2023-01-25 DIAGNOSIS — Z85850 Personal history of malignant neoplasm of thyroid: Secondary | ICD-10-CM | POA: Diagnosis not present

## 2023-01-25 DIAGNOSIS — I4510 Unspecified right bundle-branch block: Secondary | ICD-10-CM | POA: Diagnosis present

## 2023-01-25 DIAGNOSIS — Z79899 Other long term (current) drug therapy: Secondary | ICD-10-CM | POA: Diagnosis not present

## 2023-01-25 DIAGNOSIS — E119 Type 2 diabetes mellitus without complications: Secondary | ICD-10-CM | POA: Diagnosis present

## 2023-01-25 DIAGNOSIS — E876 Hypokalemia: Secondary | ICD-10-CM | POA: Diagnosis present

## 2023-01-25 DIAGNOSIS — Z87891 Personal history of nicotine dependence: Secondary | ICD-10-CM | POA: Diagnosis not present

## 2023-01-25 DIAGNOSIS — Z79890 Hormone replacement therapy (postmenopausal): Secondary | ICD-10-CM | POA: Diagnosis not present

## 2023-01-25 DIAGNOSIS — I2510 Atherosclerotic heart disease of native coronary artery without angina pectoris: Secondary | ICD-10-CM | POA: Diagnosis present

## 2023-01-25 DIAGNOSIS — N179 Acute kidney failure, unspecified: Secondary | ICD-10-CM | POA: Diagnosis present

## 2023-01-25 DIAGNOSIS — E860 Dehydration: Secondary | ICD-10-CM | POA: Diagnosis present

## 2023-01-25 DIAGNOSIS — I10 Essential (primary) hypertension: Secondary | ICD-10-CM | POA: Diagnosis present

## 2023-01-25 DIAGNOSIS — R0602 Shortness of breath: Secondary | ICD-10-CM | POA: Diagnosis not present

## 2023-01-25 DIAGNOSIS — E039 Hypothyroidism, unspecified: Secondary | ICD-10-CM | POA: Diagnosis present

## 2023-01-25 LAB — GASTROINTESTINAL PANEL BY PCR, STOOL (REPLACES STOOL CULTURE)

## 2023-01-25 LAB — CBC WITH DIFFERENTIAL/PLATELET
Abs Immature Granulocytes: 0.03 10*3/uL (ref 0.00–0.07)
Basophils Absolute: 0 10*3/uL (ref 0.0–0.1)
Basophils Relative: 0 %
Eosinophils Absolute: 0.1 10*3/uL (ref 0.0–0.5)
Eosinophils Relative: 1 %
HCT: 38.5 % — ABNORMAL LOW (ref 39.0–52.0)
Hemoglobin: 13.6 g/dL (ref 13.0–17.0)
Immature Granulocytes: 0 %
Lymphocytes Relative: 25 %
Lymphs Abs: 1.8 10*3/uL (ref 0.7–4.0)
MCH: 31.6 pg (ref 26.0–34.0)
MCHC: 35.3 g/dL (ref 30.0–36.0)
MCV: 89.5 fL (ref 80.0–100.0)
Monocytes Absolute: 0.8 10*3/uL (ref 0.1–1.0)
Monocytes Relative: 11 %
Neutro Abs: 4.4 10*3/uL (ref 1.7–7.7)
Neutrophils Relative %: 63 %
Platelets: 157 10*3/uL (ref 150–400)
RBC: 4.3 MIL/uL (ref 4.22–5.81)
RDW: 12 % (ref 11.5–15.5)
WBC: 7.1 10*3/uL (ref 4.0–10.5)
nRBC: 0 % (ref 0.0–0.2)

## 2023-01-25 LAB — BASIC METABOLIC PANEL
Anion gap: 9 (ref 5–15)
BUN: 11 mg/dL (ref 8–23)
CO2: 22 mmol/L (ref 22–32)
Calcium: 7.8 mg/dL — ABNORMAL LOW (ref 8.9–10.3)
Chloride: 109 mmol/L (ref 98–111)
Creatinine, Ser: 0.86 mg/dL (ref 0.61–1.24)
GFR, Estimated: >60 mL/min (ref >60)
Glucose, Bld: 103 mg/dL — ABNORMAL HIGH (ref 70–99)
Potassium: 3.4 mmol/L — ABNORMAL LOW (ref 3.5–5.1)
Sodium: 140 mmol/L (ref 135–145)

## 2023-01-25 LAB — MAGNESIUM: Magnesium: 1.4 mg/dL — ABNORMAL LOW (ref 1.7–2.4)

## 2023-01-25 LAB — HEMOGLOBIN A1C
Hgb A1c MFr Bld: 6.7 % — ABNORMAL HIGH (ref 4.8–5.6)
Mean Plasma Glucose: 146 mg/dL

## 2023-01-25 LAB — GLUCOSE, CAPILLARY: Glucose-Capillary: 91 mg/dL (ref 70–99)

## 2023-01-25 MED ORDER — POTASSIUM CHLORIDE CRYS ER 10 MEQ PO TBCR
40.0000 meq | EXTENDED_RELEASE_TABLET | Freq: Once | ORAL | Status: AC
  Administered 2023-01-25: 40 meq via ORAL
  Filled 2023-01-25: qty 4

## 2023-01-25 MED ORDER — MAGNESIUM SULFATE 50 % IJ SOLN
6.0000 g | Freq: Once | INTRAVENOUS | Status: AC
  Administered 2023-01-25: 6 g via INTRAVENOUS
  Filled 2023-01-25: qty 12

## 2023-01-25 MED ORDER — AMLODIPINE BESYLATE 5 MG PO TABS
5.0000 mg | ORAL_TABLET | Freq: Every day | ORAL | Status: DC
  Administered 2023-01-25 – 2023-01-26 (×2): 5 mg via ORAL
  Filled 2023-01-25 (×2): qty 1

## 2023-01-25 MED ORDER — AZITHROMYCIN 250 MG PO TABS
500.0000 mg | ORAL_TABLET | Freq: Every day | ORAL | Status: DC
  Administered 2023-01-25 – 2023-01-26 (×2): 500 mg via ORAL
  Filled 2023-01-25 (×2): qty 2

## 2023-01-25 NOTE — Progress Notes (Addendum)
Rounding Note    Patient Name: Cassady Sanchezgarcia Date of Encounter: 01/25/2023  [facility] Cardiologist: Rollene Rotunda, MD   Subjective   Offers no complaint this morning. Sitting up in the chair  Inpatient Medications    Scheduled Meds:  amLODipine  5 mg Oral Daily   apixaban  5 mg Oral BID   atorvastatin  80 mg Oral Daily   azithromycin  500 mg Oral Daily   carvedilol  12.5 mg Oral BID WC   clopidogrel  75 mg Oral Daily   colestipol  1 g Oral BID   dicyclomine  20 mg Oral TID AC   ezetimibe  10 mg Oral Daily   isosorbide mononitrate  60 mg Oral Daily   levothyroxine  137 mcg Oral Q0600   lipase/protease/amylase  36,000 Units Oral TID AC   potassium chloride  10 mEq Oral Daily   sodium chloride flush  3 mL Intravenous Q12H   Continuous Infusions:  sodium chloride 125 mL/hr at 01/24/23 1854   magnesium sulfate bolus IVPB 6 g (01/25/23 0942)   PRN Meds: acetaminophen **OR** acetaminophen, hydrALAZINE, ondansetron **OR** ondansetron (ZOFRAN) IV, oxyCODONE   Vital Signs    Vitals:   01/24/23 1955 01/24/23 2332 01/25/23 0545 01/25/23 0729  BP: 117/72 136/86 (!) 149/76 (!) 145/90  Pulse: (!) 56 61 (!) 55 61  Resp: 18 18 17 17   Temp: 98.6 F (37 C) 98.2 F (36.8 C) 98.1 F (36.7 C) 98.2 F (36.8 C)  TempSrc: Oral Oral Oral Oral  SpO2: 100% 96% 97% 98%  Weight:   94.5 kg   Height:        Intake/Output Summary (Last 24 hours) at 01/25/2023 1042 Last data filed at 01/25/2023 0700 Gross per 24 hour  Intake 721 ml  Output --  Net 721 ml      01/25/2023    5:45 AM 01/24/2023    4:55 AM 01/23/2023    5:16 PM  Last 3 Weights  Weight (lbs) 208 lb 6.4 oz 203 lb 1.6 oz 208 lb 1.8 oz  Weight (kg) 94.53 kg 92.126 kg 94.4 kg      Telemetry    Sinus Rhythm - Personally Reviewed  ECG    No new tracing  Physical Exam   GEN: No acute distress.   Neck: No JVD Cardiac: RRR, no murmurs, rubs, or gallops.  Respiratory: Clear to auscultation  bilaterally. GI: Soft, nontender, non-distended  MS: No edema; No deformity. Neuro:  Nonfocal  Psych: Normal affect   Labs    High Sensitivity Troponin:   Recent Labs  Lab 01/23/23 1316 01/23/23 1528  TROPONINIHS 13 11     Chemistry Recent Labs  Lab 01/23/23 1316 01/23/23 1528 01/24/23 0323 01/25/23 0031  NA 136  --  141 140  K 3.2*  --  3.2* 3.4*  CL 104  --  110 109  CO2 22  --  22 22  GLUCOSE 157*  --  93 103*  BUN 25*  --  19 11  CREATININE 1.59*  --  1.03 0.86  CALCIUM 9.4  --  8.1* 7.8*  MG  --  1.9  --  1.4*  PROT  --  6.7 6.0*  --   ALBUMIN  --  4.0 3.5  --   AST  --  26 20  --   ALT  --  33 29  --   ALKPHOS  --  82 74  --   BILITOT  --  1.7* 1.6*  --   GFRNONAA 49*  --  >60 >60  ANIONGAP 10  --  9 9    Lipids  Recent Labs  Lab 01/24/23 0323  CHOL 60  TRIG 76  HDL 17*  LDLCALC 28  CHOLHDL 3.5    Hematology Recent Labs  Lab 01/23/23 1316 01/24/23 0323 01/25/23 0031  WBC 10.2 7.1 7.1  RBC 5.02 4.48 4.30  HGB 16.0 13.8 13.6  HCT 45.7 39.3 38.5*  MCV 91.0 87.7 89.5  MCH 31.9 30.8 31.6  MCHC 35.0 35.1 35.3  RDW 11.9 11.9 12.0  PLT 218 159 157   Thyroid No results for input(s): "TSH", "FREET4" in the last 168 hours.  BNP Recent Labs  Lab 01/23/23 1322  BNP 16.5    DDimer No results for input(s): "DDIMER" in the last 168 hours.   Radiology    ECHOCARDIOGRAM COMPLETE  Result Date: 01/24/2023    ECHOCARDIOGRAM REPORT   Patient Name:   DUPREE Pattison Date of Exam: 01/24/2023 Medical Rec #:  [id number]      Height:       71.0 in Accession #:    [id number]     Weight:       203.1 lb Date of Birth:  [birth year]      BSA:          2.122 m Patient Age:    62 years       BP:           129/71 mmHg Patient Gender: M              HR:           61 bpm. Exam Location:  Inpatient Procedure: 2D Echo, Cardiac Doppler, Color Doppler and 3D Echo Indications:    R94.31 Abnormal EKG  History:        Patient has prior history of Echocardiogram examinations, most                  recent 09/22/2020. CAD, Abnormal ECG, Arrythmias:Atrial                 Fibrillation, Signs/Symptoms:Dyspnea, Shortness of Breath, Chest                 Pain and Syncope; Risk Factors:Dyslipidemia, Diabetes and                 Hypertension.  Sonographer:    Sheralyn Boatman RDCS Referring Phys: [id number] DANIEL V THOMPSON IMPRESSIONS  1. Left ventricular ejection fraction, by estimation, is 55 to 60%. The left ventricle has normal function. The left ventricle has no regional wall motion abnormalities. Left ventricular diastolic parameters are consistent with Grade I diastolic dysfunction (impaired relaxation).  2. Right ventricular systolic function is normal. The right ventricular size is mildly enlarged. There is normal pulmonary artery systolic pressure. The estimated right ventricular systolic pressure is 23.6 mmHg.  3. The mitral valve is abnormal. Trivial mitral valve regurgitation.  4. The aortic valve is tricuspid. Aortic valve regurgitation is not visualized. No aortic stenosis is present.  5. The inferior vena cava is normal in size with greater than 50% respiratory variability, suggesting right atrial pressure of 3 mmHg. Comparison(s): No significant change from prior study. 09/22/2020: LVEF 55-60%, grade 1 DD. FINDINGS  Left Ventricle: Left ventricular ejection fraction, by estimation, is 55 to 60%. The left ventricle has normal function. The left ventricle has no regional wall motion abnormalities. The left ventricular internal cavity size was normal  in size. There is  no left ventricular hypertrophy. Left ventricular diastolic parameters are consistent with Grade I diastolic dysfunction (impaired relaxation). Indeterminate filling pressures. Right Ventricle: The right ventricular size is mildly enlarged. No increase in right ventricular wall thickness. Right ventricular systolic function is normal. There is normal pulmonary artery systolic pressure. The tricuspid regurgitant velocity is 2.27  m/s,  and with an assumed right atrial pressure of 3 mmHg, the estimated right ventricular systolic pressure is 23.6 mmHg. Left Atrium: Left atrial size was normal in size. Right Atrium: Right atrial size was normal in size. Pericardium: There is no evidence of pericardial effusion. Mitral Valve: The mitral valve is abnormal. There is mild thickening of the mitral valve leaflet(s). Mild mitral annular calcification. Trivial mitral valve regurgitation. Tricuspid Valve: The tricuspid valve is normal in structure. Tricuspid valve regurgitation is trivial. Aortic Valve: The aortic valve is tricuspid. Aortic valve regurgitation is not visualized. No aortic stenosis is present. Pulmonic Valve: The pulmonic valve was grossly normal. Pulmonic valve regurgitation is trivial. Aorta: The aortic root and ascending aorta are structurally normal, with no evidence of dilitation. Venous: The inferior vena cava is normal in size with greater than 50% respiratory variability, suggesting right atrial pressure of 3 mmHg. IAS/Shunts: No atrial level shunt detected by color flow Doppler.  LEFT VENTRICLE PLAX 2D LVIDd:         4.40 cm      Diastology LVIDs:         2.90 cm      LV e' medial:    4.57 cm/s LV PW:         1.10 cm      LV E/e' medial:  12.2 LV IVS:        0.80 cm      LV e' lateral:   6.85 cm/s LVOT diam:     2.30 cm      LV E/e' lateral: 8.1 LV SV:         92 LV SV Index:   43 LVOT Area:     4.15 cm                              3D Volume EF: LV Volumes (MOD)            3D EF:        50 % LV vol d, MOD A2C: 116.0 ml LV EDV:       148 ml LV vol d, MOD A4C: 136.0 ml LV ESV:       74 ml LV vol s, MOD A2C: 41.2 ml  LV SV:        74 ml LV vol s, MOD A4C: 55.2 ml LV SV MOD A2C:     74.8 ml LV SV MOD A4C:     136.0 ml LV SV MOD BP:      77.1 ml RIGHT VENTRICLE             IVC RV S prime:     18.30 cm/s  IVC diam: 2.00 cm TAPSE (M-mode): 2.8 cm LEFT ATRIUM             Index        RIGHT ATRIUM           Index LA diam:        4.50 cm 2.12  cm/m   RA Area:     17.80 cm LA Vol (A2C):  59.8 ml 28.18 ml/m  RA Volume:   48.40 ml  22.80 ml/m LA Vol (A4C):   48.5 ml 22.85 ml/m LA Biplane Vol: 55.6 ml 26.20 ml/m  AORTIC VALVE LVOT Vmax:   99.80 cm/s LVOT Vmean:  64.900 cm/s LVOT VTI:    0.221 m  AORTA Ao Root diam: 3.40 cm Ao Asc diam:  3.20 cm MITRAL VALVE               TRICUSPID VALVE MV Area (PHT): 2.56 cm    TR Peak grad:   20.6 mmHg MV Decel Time: 296 msec    TR Vmax:        227.00 cm/s MV E velocity: 55.80 cm/s MV A velocity: 86.40 cm/s  SHUNTS MV E/A ratio:  0.65        Systemic VTI:  0.22 m                            Systemic Diam: 2.30 cm Zoila Shutter MD Electronically signed by Zoila Shutter MD Signature Date/Time: 01/24/2023/1:46:36 PM    Final    CT ABDOMEN PELVIS WO CONTRAST  Result Date: 01/23/2023 CLINICAL DATA:  Diarrhea, fatigue EXAM: CT ABDOMEN AND PELVIS WITHOUT CONTRAST TECHNIQUE: Multidetector CT imaging of the abdomen and pelvis was performed following the standard protocol without IV contrast. RADIATION DOSE REDUCTION: This exam was performed according to the departmental dose-optimization program which includes automated exposure control, adjustment of the mA and/or kV according to patient size and/or use of iterative reconstruction technique. COMPARISON:  None Available. FINDINGS: Lower chest: Coronary artery and aortic calcifications. Linear scarring or atelectasis in the left base. No effusions. Hepatobiliary: No focal liver abnormality is seen. Status post cholecystectomy. No biliary dilatation. Pancreas: No focal abnormality or ductal dilatation. Spleen: No focal abnormality.  Normal size. Adrenals/Urinary Tract: Large cyst off the lower pole of the right kidney measures up to 9.3 cm. This appears simple. No follow-up imaging recommended. Punctate nonobstructing stone in the lower pole of the right kidney. No hydronephrosis. Adrenal glands and urinary bladder unremarkable. Stomach/Bowel: Normal appendix. Left colonic  diverticulosis. No active diverticulitis. Stomach and small bowel decompressed, unremarkable. Vascular/Lymphatic: Heavily calcified aorta and iliac vessels. No evidence of aneurysm or adenopathy. Reproductive: No visible focal abnormality. Other: No free fluid or free air. Musculoskeletal: No acute bony abnormality. IMPRESSION: No acute findings in the abdomen or pelvis. Right lower pole punctate nephrolithiasis.  No hydronephrosis. Aortic atherosclerosis. Left colonic diverticulosis.  No active diverticulitis. Electronically Signed   By: Charlett Nose M.D.   On: 01/23/2023 21:25   DG Chest 2 View  Result Date: 01/23/2023 CLINICAL DATA:  Chest pain. EXAM: CHEST - 2 VIEW COMPARISON:  Chest two views 03/30/2021 FINDINGS: Cardiac silhouette and mediastinal contours are within normal limits. Mild calcification within aortic arch. Mildly decreased lung volumes. The lungs are clear. No pleural effusion pneumothorax. Mild multilevel degenerative disc changes of the thoracic spine. Cholecystectomy clips. IMPRESSION: No active cardiopulmonary disease. Electronically Signed   By: Neita Garnet M.D.   On: 01/23/2023 14:37    Cardiac Studies   Echo: 01/24/2023: Normal LV size and function.  EF 55 to 60%.  No RWMA.  GR 1 DD.  Normal RV size and function with normal RVSP and RAP.  Mild MR.  Relatively normal AoV.    Patient Profile     62 y.o. male with history of CAD s/p PCI to RCA and LAD, pAfib on apixaban, HTN,  HLD, DMII, and bile acid malabsorption with chronic diarrhea who presented with worsening fatigue, DOE, and left jaw pain for which Cardiology was consulted.   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 -- no recurrent episodes of jaw pain; was able to ambulate in the hall without any notable symptoms. -- would plan for outpatient exercise myoview with follow up once improved from current acute illness -- continue plavix, statin, coreg, imdur  Paroxsymal afib -- remains in SR -- on Eliquis  Diarrhea 2/2 enteropathogenic E. coli  -- improved -- management per primary  Hypokalemia Hypomag -- supplement  Will arrange for outpatient follow up, along with stress test once patient ready for DC  For questions or updates, please contact [facility] Please consult [email or web address] for contact info under        Signed, Laverda Page, NP  01/25/2023, 10:42 AM    ATTENDING ATTESTATION  I have seen, examined and evaluated the patient this morning on rounds along with Laverda Page, NP.  After reviewing all the available data and chart, we discussed the patients laboratory, study & physical findings as well as symptoms in detail.  I agree with her findings, examination as well as impression recommendations as per our discussion.    Attending adjustments noted in italics.   Echo reviewed.  Stable with no regional wall motion abnormalities.  Nothing to suggest acute ischemic event.  Agree with outpatient stress test evaluation followed by outpatient APP/MD follow-up.    [facility] will sign off.   Medication Recommendations: Continue current cardiac meds Other recommendations (labs, testing, etc): Please notify us the timing of when he will be discharged, we will then work on scheduling outpatient nuclear stress test Follow up as an outpatient: We will arrange outpatient follow-up after stress test  Please contact cardiology service as to the timing of when he is to be discharged we can get these appointments scheduled .   Marykay Lex,  MD, MS Bryan Lemma, M.D., M.S. Interventional Cardiologist  [facility]  Pager # [phone] Phone # [phone] [address] [place], [place] [address]

## 2023-01-25 NOTE — Plan of Care (Signed)

## 2023-01-25 NOTE — Progress Notes (Signed)
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  Due to concerns for left-sided jaw pain, dyspnea on exertion, fatigue for anginal equivalent cardiology consulted.  Stool studies obtained.  Patient hydrated with IV fluids.  2D echo ordered.   Assessment & Plan:   Principal Problem:   Generalized weakness Active Problems:   AKI (acute kidney injury) (HCC)   CAD in native artery, with prior stents, and instent restenosis of small vessel, medical therapy, LAD and RCA stents are patent   HLD (hyperlipidemia)   DM (diabetes mellitus), type 2 (HCC) diet controlled   Hypokalemia   Essential hypertension   SOB (shortness of breath)   PAF (paroxysmal atrial fibrillation) (HCC)   Dysphagia   Hypothyroidism   Obesity (BMI 30.0-34.9)   Acute diarrhea   Dehydration   Other fatigue   Dyspnea   Hypomagnesemia  #1 generalized weakness/fatigue/shortness of breath on exertion/jaw pain/rule out anginal equivalent -Concern for probable anginal equivalent as patient states usually with his cardiac issues never has chest pain but does manifest his jaw and neck pain. -EKG done with new RBBB with inferior lateral T wave inversion noted. -Initial set of troponin negative. -Fasting lipid panel with LDL of 28. -2D echo ordered with a EF of 55 to 60%, grade 1 diastolic dysfunction, NWMA, normal right ventricular systolic function, trivial MVR.   -Continue home cardiac regimen. -Hold Lotrel. -Patient seen in consultation by cardiology, currently asymptomatic this morning, cardiology recommended repletion of electrolytes, patient noted not to be jaw pain-free at rest and will assess on ambulation. -Patient ambulated with PT yesterday and did well with ambulation with no jaw pain. -Per cardiology if 2D echo with no wall motion abnormalities and reassuring will need to be set up for outpatient PET versus Myoview and if patient with abnormal 2D echo or recurrence of jaw pain with ambulation despite improvement with dehydration and electrolyte abnormalities may need a cardiac catheterization. -2D echo done with no wall motion abnormalities and likely need outpatient follow-up with cardiology. -Cardiology following and appreciate input and recommendations.   2.  Acute on chronic diarrhea secondary to enteropathogenic E. coli -Patient with history of bile acid malabsorption, history of chronic diarrhea however states over the past 2 days prior to admission, has had significant increase in diarrhea which she describes as gallons of liquid malodorous stool. -C. difficile PCR negative.   -GI pathogen panel with enteropathogenic E. coli.  -CT abdomen and pelvis with no acute abnormalities.   -Due to severity of patient's diarrhea in the setting of bile acid malabsorption and presentation with systolic blood pressures in the 50s with ongoing volume loss diarrhea will place on azithromycin 500 mg daily x 5 days.  Discussed with ID.   -Discontinue contact precautions.    -Continue home regimen colestipol, Creon, Bentyl.  -Supportive care.   3.  Acute kidney injury -Likely secondary to prerenal azotemia as patient presented with significant increased GI losses from acute on chronic diarrhea, in the setting  of ACE inhibitor. -UA ordered and pending.  Urine studies pending.   -Renal function improved with hydration.  -Continue to hold ACE  inhibitor. -CT abdomen and pelvis with no hydronephrosis.   -Urine output not properly recorded.  -Renal function improving with hydration.  -IV fluids, supportive care.     4.  Hyperlipidemia -LDL of 28.  -Continue home regimen Lipitor and Zetia.   5.  Dehydration IV fluids.   6.  Paroxysmal atrial fibrillation -Currently normal sinus rhythm.   -Continue home regimen Coreg.  Eliquis for anticoagulation.   7.  Hypothyroidism -Continue Synthroid.   8.  Hypokalemia/hypomagnesemia -Likely secondary to GI losses. -Potassium at 3.4 this morning, magnesium at 1.4.   -Schedule 40 mEq p.o. x 1 in addition to home regimen of K-Dur 10 milliequivalents daily.   -Magnesium sulfate 6 g IV x 1.  -Repeat labs in the AM.   9.  Diet-controlled diabetes mellitus type 2 -Hemoglobin A1c 6.1 (09/22/2020 ). -Repeat hemoglobin A1c 6.7. -CBG 91 this morning.   10.  Hypertension -BP stable. -Continue Coreg, Imdur.   -Hold Lotrel, start half home dose of Norvasc 5 mg daily.    11.  Obesity -BMI 30.27 kg/m. -Lifestyle modification -Outpatient follow-up with PCP.   DVT prophylaxis: Eliquis Code Status: Full Family Communication: Updated patient.  No family at bedside. Disposition: Likely home once clinically improved, generalized weakness improved and cleared by cardiology hopefully in the next 24 hours.   Status is: Inpatient.    Consultants:  Cardiology: Dr.Nishan 01/23/2023  Procedures:  CT abdomen and pelvis 01/23/2023 Chest x-ray 01/23/2023 2D echo 01/24/2023   Antimicrobials:  Anti-infectives (From admission, onward)    Start     Dose/Rate Route Frequency Ordered Stop   01/25/23 1000  azithromycin (ZITHROMAX) tablet 500 mg        500 mg Oral Daily 01/25/23 0830 01/30/23 0959         Subjective: Sitting up in chair.  Overall feels significantly better than he did on admission.  Stated had about 10 loose stools yesterday.  3 loose stools already this morning.  Feels volume  of stool has decreased since admission.  Ambulated in hallway yesterday without any significant distress or significant weakness or jaw pain.   Objective: Vitals:   01/24/23 1955 01/24/23 2332 01/25/23 0545 01/25/23 0729  BP: 117/72 136/86 (!) 149/76 (!) 145/90  Pulse: (!) 56 61 (!) 55 61  Resp: 18 18 17 17   Temp: 98.6 F (37 C) 98.2 F (36.8 C) 98.1 F (36.7 C) 98.2 F (36.8 C)  TempSrc: Oral Oral Oral Oral  SpO2: 100% 96% 97% 98%  Weight:   94.5 kg   Height:        Intake/Output Summary (Last 24 hours) at 01/25/2023 0929 Last data filed at 01/25/2023 0700 Gross per 24 hour  Intake 721 ml  Output --  Net 721 ml   Filed Weights   01/23/23 1716 01/24/23 0455 01/25/23 0545  Weight: 94.4 kg 92.1 kg 94.5 kg    Examination:  General exam: NAD Respiratory system: Lungs clear to auscultation bilaterally.  No wheezes, no crackles, no rhonchi.  Fair air movement.  Speaking in full sentences.  Cardiovascular system: Bradycardia.  No murmurs rubs or gallops.  No JVD.  No lower extremity edema.  Gastrointestinal system: Abdomen is soft, nondistended, some diffuse abdominal discomfort to palpation.  Positive bowel sounds.  No rebound.  No guarding.  Central nervous system: Alert and oriented. No focal neurological deficits. Extremities:  Symmetric 5 x 5 power. Skin: No rashes, lesions or ulcers Psychiatry: Judgement and insight appear normal. Mood & affect appropriate.     Data Reviewed: I have personally reviewed following labs and imaging studies  CBC: Recent Labs  Lab 01/23/23 1316 01/24/23 0323 01/25/23 0031  WBC 10.2 7.1 7.1  NEUTROABS  --  4.4 4.4  HGB 16.0 13.8 13.6  HCT 45.7 39.3 38.5*  MCV 91.0 87.7 89.5  PLT 218 159 157    Basic Metabolic Panel: Recent Labs  Lab 01/23/23 1316 01/23/23 1528 01/24/23 0323 01/25/23 0031  NA 136  --  141 140  K 3.2*  --  3.2* 3.4*  CL 104  --  110 109  CO2 22  --  22 22  GLUCOSE 157*  --  93 103*  BUN 25*  --  19 11   CREATININE 1.59*  --  1.03 0.86  CALCIUM 9.4  --  8.1* 7.8*  MG  --  1.9  --  1.4*    GFR: Estimated Creatinine Clearance: 105.9 mL/min (by C-G formula based on SCr of 0.86 mg/dL).  Liver Function Tests: Recent Labs  Lab 01/23/23 1528 01/24/23 0323  AST 26 20  ALT 33 29  ALKPHOS 82 74  BILITOT 1.7* 1.6*  PROT 6.7 6.0*  ALBUMIN 4.0 3.5    CBG: Recent Labs  Lab 01/24/23 0549 01/25/23 0543  GLUCAP 89 91     Recent Results (from the past 240 hour(s))  Gastrointestinal Panel by PCR , Stool     Status: Abnormal   Collection Time: 01/23/23  3:46 PM   Specimen: Stool  Result Value Ref Range Status   Campylobacter species NOT DETECTED NOT DETECTED Final   Plesimonas shigelloides NOT DETECTED NOT DETECTED Final   Salmonella species NOT DETECTED NOT DETECTED Final   Yersinia enterocolitica NOT DETECTED NOT DETECTED Final   Vibrio species NOT DETECTED NOT DETECTED Final   Vibrio cholerae NOT DETECTED NOT DETECTED Final   Enteroaggregative E coli (EAEC) NOT DETECTED NOT DETECTED Final   Enteropathogenic E coli (EPEC) DETECTED (A) NOT DETECTED Final    Comment: RESULT CALLED TO, READ BACK BY AND VERIFIED WITH: SANDRA BROWN@[id number]  01/25/23 RH    Enterotoxigenic E coli (ETEC) NOT DETECTED NOT DETECTED Final   Shiga like toxin producing E coli (STEC) NOT DETECTED NOT DETECTED Final   Shigella/Enteroinvasive E coli (EIEC) NOT DETECTED NOT DETECTED Final   Cryptosporidium NOT DETECTED NOT DETECTED Final   Cyclospora cayetanensis NOT DETECTED NOT DETECTED Final   Entamoeba histolytica NOT DETECTED NOT DETECTED Final   Giardia lamblia NOT DETECTED NOT DETECTED Final   Adenovirus F40/41 NOT DETECTED NOT DETECTED Final   Astrovirus NOT DETECTED NOT DETECTED Final   Norovirus GI/GII NOT DETECTED NOT DETECTED Final   Rotavirus A NOT DETECTED NOT DETECTED Final   Sapovirus (I, II, IV, and V) NOT DETECTED NOT DETECTED Final    Comment: Performed at [facility], [address] [place], [place] [address]  C Difficile Quick Screen w PCR reflex     Status: None   Collection Time: 01/23/23  3:46 PM   Specimen: Stool  Result Value Ref Range Status   C Diff antigen NEGATIVE NEGATIVE Final   C Diff toxin NEGATIVE NEGATIVE Final   C Diff interpretation No C. difficile detected.  Final    Comment: Performed at [facility], [address], [place], [place] [address]         Radiology  Studies: ECHOCARDIOGRAM COMPLETE  Result Date: 01/24/2023    ECHOCARDIOGRAM REPORT   Patient Name:   William Mcmahon Date of Exam: 01/24/2023 Medical Rec #:  [id number]      Height:       71.0 in Accession #:    [id number]     Weight:       203.1 lb Date of Birth:  Apr 11, 1961      BSA:          2.122 m Patient Age:    62 years       BP:           129/71 mmHg Patient Gender: M              HR:           61 bpm. Exam Location:  Inpatient Procedure: 2D Echo, Cardiac Doppler, Color Doppler and 3D Echo Indications:    R94.31 Abnormal EKG  History:        Patient has prior history of Echocardiogram examinations, most                 recent 09/22/2020. CAD, Abnormal ECG, Arrythmias:Atrial                 Fibrillation, Signs/Symptoms:Dyspnea, Shortness of Breath, Chest                 Pain and Syncope; Risk Factors:Dyslipidemia, Diabetes and                 Hypertension.  Sonographer:    Sheralyn Boatman RDCS Referring Phys: [id number] Azalie Harbeck V Talissa Apple IMPRESSIONS  1. Left ventricular ejection fraction, by estimation, is 55 to 60%. The left ventricle has normal function. The left ventricle has no regional wall motion abnormalities. Left ventricular diastolic parameters are consistent with Grade I diastolic dysfunction (impaired relaxation).  2. Right ventricular systolic function is normal. The right ventricular size is mildly enlarged. There is normal pulmonary artery systolic pressure. The estimated right ventricular systolic pressure is 23.6 mmHg.  3. The mitral valve is abnormal. Trivial mitral valve  regurgitation.  4. The aortic valve is tricuspid. Aortic valve regurgitation is not visualized. No aortic stenosis is present.  5. The inferior vena cava is normal in size with greater than 50% respiratory variability, suggesting right atrial pressure of 3 mmHg. Comparison(s): No significant change from prior study. 09/22/2020: LVEF 55-60%, grade 1 DD. FINDINGS  Left Ventricle: Left ventricular ejection fraction, by estimation, is 55 to 60%. The left ventricle has normal function. The left ventricle has no regional wall motion abnormalities. The left ventricular internal cavity size was normal in size. There is  no left ventricular hypertrophy. Left ventricular diastolic parameters are consistent with Grade I diastolic dysfunction (impaired relaxation). Indeterminate filling pressures. Right Ventricle: The right ventricular size is mildly enlarged. No increase in right ventricular wall thickness. Right ventricular systolic function is normal. There is normal pulmonary artery systolic pressure. The tricuspid regurgitant velocity is 2.27  m/s, and with an assumed right atrial pressure of 3 mmHg, the estimated right ventricular systolic pressure is 23.6 mmHg. Left Atrium: Left atrial size was normal in size. Right Atrium: Right atrial size was normal in size. Pericardium: There is no evidence of pericardial effusion. Mitral Valve: The mitral valve is abnormal. There is mild thickening of the mitral valve leaflet(s). Mild mitral annular calcification. Trivial mitral valve regurgitation. Tricuspid Valve: The tricuspid valve is normal in structure. Tricuspid valve regurgitation is trivial. Aortic Valve: The aortic valve  is tricuspid. Aortic valve regurgitation is not visualized. No aortic stenosis is present. Pulmonic Valve: The pulmonic valve was grossly normal. Pulmonic valve regurgitation is trivial. Aorta: The aortic root and ascending aorta are structurally normal, with no evidence of dilitation. Venous: The inferior  vena cava is normal in size with greater than 50% respiratory variability, suggesting right atrial pressure of 3 mmHg. IAS/Shunts: No atrial level shunt detected by color flow Doppler.  LEFT VENTRICLE PLAX 2D LVIDd:         4.40 cm      Diastology LVIDs:         2.90 cm      LV e' medial:    4.57 cm/s LV PW:         1.10 cm      LV E/e' medial:  12.2 LV IVS:        0.80 cm      LV e' lateral:   6.85 cm/s LVOT diam:     2.30 cm      LV E/e' lateral: 8.1 LV SV:         92 LV SV Index:   43 LVOT Area:     4.15 cm                              3D Volume EF: LV Volumes (MOD)            3D EF:        50 % LV vol d, MOD A2C: 116.0 ml LV EDV:       148 ml LV vol d, MOD A4C: 136.0 ml LV ESV:       74 ml LV vol s, MOD A2C: 41.2 ml  LV SV:        74 ml LV vol s, MOD A4C: 55.2 ml LV SV MOD A2C:     74.8 ml LV SV MOD A4C:     136.0 ml LV SV MOD BP:      77.1 ml RIGHT VENTRICLE             IVC RV S prime:     18.30 cm/s  IVC diam: 2.00 cm TAPSE (M-mode): 2.8 cm LEFT ATRIUM             Index        RIGHT ATRIUM           Index LA diam:        4.50 cm 2.12 cm/m   RA Area:     17.80 cm LA Vol (A2C):   59.8 ml 28.18 ml/m  RA Volume:   48.40 ml  22.80 ml/m LA Vol (A4C):   48.5 ml 22.85 ml/m LA Biplane Vol: 55.6 ml 26.20 ml/m  AORTIC VALVE LVOT Vmax:   99.80 cm/s LVOT Vmean:  64.900 cm/s LVOT VTI:    0.221 m  AORTA Ao Root diam: 3.40 cm Ao Asc diam:  3.20 cm MITRAL VALVE               TRICUSPID VALVE MV Area (PHT): 2.56 cm    TR Peak grad:   20.6 mmHg MV Decel Time: 296 msec    TR Vmax:        227.00 cm/s MV E velocity: 55.80 cm/s MV A velocity: 86.40 cm/s  SHUNTS MV E/A ratio:  0.65        Systemic VTI:  0.22 m  Systemic Diam: 2.30 cm Zoila Shutter MD Electronically signed by Zoila Shutter MD Signature Date/Time: 01/24/2023/1:46:36 PM    Final    CT ABDOMEN PELVIS WO CONTRAST  Result Date: 01/23/2023 CLINICAL DATA:  Diarrhea, fatigue EXAM: CT ABDOMEN AND PELVIS WITHOUT CONTRAST TECHNIQUE:  Multidetector CT imaging of the abdomen and pelvis was performed following the standard protocol without IV contrast. RADIATION DOSE REDUCTION: This exam was performed according to the departmental dose-optimization program which includes automated exposure control, adjustment of the mA and/or kV according to patient size and/or use of iterative reconstruction technique. COMPARISON:  None Available. FINDINGS: Lower chest: Coronary artery and aortic calcifications. Linear scarring or atelectasis in the left base. No effusions. Hepatobiliary: No focal liver abnormality is seen. Status post cholecystectomy. No biliary dilatation. Pancreas: No focal abnormality or ductal dilatation. Spleen: No focal abnormality.  Normal size. Adrenals/Urinary Tract: Large cyst off the lower pole of the right kidney measures up to 9.3 cm. This appears simple. No follow-up imaging recommended. Punctate nonobstructing stone in the lower pole of the right kidney. No hydronephrosis. Adrenal glands and urinary bladder unremarkable. Stomach/Bowel: Normal appendix. Left colonic diverticulosis. No active diverticulitis. Stomach and small bowel decompressed, unremarkable. Vascular/Lymphatic: Heavily calcified aorta and iliac vessels. No evidence of aneurysm or adenopathy. Reproductive: No visible focal abnormality. Other: No free fluid or free air. Musculoskeletal: No acute bony abnormality. IMPRESSION: No acute findings in the abdomen or pelvis. Right lower pole punctate nephrolithiasis.  No hydronephrosis. Aortic atherosclerosis. Left colonic diverticulosis.  No active diverticulitis. Electronically Signed   By: Charlett Nose M.D.   On: 01/23/2023 21:25   DG Chest 2 View  Result Date: 01/23/2023 CLINICAL DATA:  Chest pain. EXAM: CHEST - 2 VIEW COMPARISON:  Chest two views 03/30/2021 FINDINGS: Cardiac silhouette and mediastinal contours are within normal limits. Mild calcification within aortic arch. Mildly decreased lung volumes. The lungs  are clear. No pleural effusion pneumothorax. Mild multilevel degenerative disc changes of the thoracic spine. Cholecystectomy clips. IMPRESSION: No active cardiopulmonary disease. Electronically Signed   By: Neita Garnet M.D.   On: 01/23/2023 14:37        Scheduled Meds:  amLODipine  5 mg Oral Daily   apixaban  5 mg Oral BID   atorvastatin  80 mg Oral Daily   azithromycin  500 mg Oral Daily   carvedilol  12.5 mg Oral BID WC   clopidogrel  75 mg Oral Daily   colestipol  1 g Oral BID   dicyclomine  20 mg Oral TID AC   ezetimibe  10 mg Oral Daily   isosorbide mononitrate  60 mg Oral Daily   levothyroxine  137 mcg Oral Q0600   lipase/protease/amylase  36,000 Units Oral TID AC   potassium chloride  10 mEq Oral Daily   potassium chloride  40 mEq Oral Once   sodium chloride flush  3 mL Intravenous Q12H   Continuous Infusions:  sodium chloride 125 mL/hr at 01/24/23 1854   magnesium sulfate bolus IVPB       LOS: 0 days    Time spent: 35 minutes    Ramiro Harvest, MD [facility]   To contact the attending provider between 7A-7P or the covering provider during after hours 7P-7A, please log into the web site [email or web address] and access using universal  password for that web site. If you do not have the password, please call the hospital operator.  01/25/2023, 9:29 AM

## 2023-01-26 DIAGNOSIS — R531 Weakness: Secondary | ICD-10-CM | POA: Diagnosis not present

## 2023-01-26 LAB — CBC
HCT: 39.7 % (ref 39.0–52.0)
Hemoglobin: 14.2 g/dL (ref 13.0–17.0)
MCH: 31.8 pg (ref 26.0–34.0)
MCHC: 35.8 g/dL (ref 30.0–36.0)
MCV: 89 fL (ref 80.0–100.0)
Platelets: 173 10*3/uL (ref 150–400)
RBC: 4.46 MIL/uL (ref 4.22–5.81)
RDW: 11.9 % (ref 11.5–15.5)
WBC: 7.9 10*3/uL (ref 4.0–10.5)
nRBC: 0 % (ref 0.0–0.2)

## 2023-01-26 LAB — BASIC METABOLIC PANEL
Anion gap: 12 (ref 5–15)
BUN: 9 mg/dL (ref 8–23)
CO2: 25 mmol/L (ref 22–32)
Calcium: 7.9 mg/dL — ABNORMAL LOW (ref 8.9–10.3)
Chloride: 103 mmol/L (ref 98–111)
Creatinine, Ser: 0.84 mg/dL (ref 0.61–1.24)
GFR, Estimated: >60 mL/min (ref >60)
Glucose, Bld: 102 mg/dL — ABNORMAL HIGH (ref 70–99)
Potassium: 3.3 mmol/L — ABNORMAL LOW (ref 3.5–5.1)
Sodium: 140 mmol/L (ref 135–145)

## 2023-01-26 LAB — MAGNESIUM: Magnesium: 1.8 mg/dL (ref 1.7–2.4)

## 2023-01-26 MED ORDER — AMLODIPINE BESYLATE 5 MG PO TABS: 5.0000 mg | ORAL_TABLET | Freq: Every day | ORAL | 0 refills | Status: DC

## 2023-01-26 MED ORDER — AZITHROMYCIN 500 MG PO TABS: 500.0000 mg | ORAL_TABLET | Freq: Every day | ORAL | 0 refills | Status: AC

## 2023-01-26 MED ORDER — POTASSIUM CHLORIDE CRYS ER 20 MEQ PO TBCR
40.0000 meq | EXTENDED_RELEASE_TABLET | Freq: Once | ORAL | Status: AC
  Administered 2023-01-26: 40 meq via ORAL
  Filled 2023-01-26: qty 2

## 2023-01-26 NOTE — Plan of Care (Signed)
  Problem: Education: Goal: Knowledge of General Education information will improve Description: Including pain rating scale, medication(s)/side effects and non-pharmacologic comfort measures Outcome: Adequate for Discharge   Problem: Education: Goal: Ability to demonstrate management of disease process will improve Outcome: Adequate for Discharge Goal: Ability to verbalize understanding of medication therapies will improve Outcome: Adequate for Discharge Goal: Individualized Educational Video(s) Outcome: Adequate for Discharge   Problem: Activity: Goal: Capacity to carry out activities will improve Outcome: Adequate for Discharge   Problem: Cardiac: Goal: Ability to achieve and maintain adequate cardiopulmonary perfusion will improve Outcome: Adequate for Discharge   

## 2023-01-26 NOTE — Discharge Summary (Signed)
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 Due to concerns for left-sided jaw pain, dyspnea on exertion, fatigue for anginal equivalent cardiology consulted. Stool studies obtained. Patient hydrated with IV fluids.  GI pathogen panel showed enteropathogenic E. coli, started on azithromycin.  Diarrhea has stopped.  Denies any abdomen, nausea or vomiting and feels ready to go home today.  Cardiology will follow as an outpatient.  Following problems were addressed during the hospitalization:  1)generalized weakness/fatigue/shortness of breath on exertion/jaw pain/rule out anginal equivalent -Concern for probable anginal equivalent as patient states usually with his cardiac issues never has chest pain but does manifest his jaw and neck pain. -EKG done with new RBBB with inferior lateral T wave inversion noted. -Initial set of troponin negative. -Fasting lipid panel with LDL of 28. -2D echo ordered with a EF of 55 to 60%, grade 1 diastolic dysfunction, NWMA, normal right ventricular systolic function, trivial MVR.  -Continue home cardiac regimen. -Hold Lotrel. -2D echo done with no wall motion abnormalities and likely need outpatient follow-up with cardiology. -Cardiology following and plans to follow-up as an outpatient   2.  Acute  on chronic diarrhea secondary to enteropathogenic E. coli -Patient with history of bile acid malabsorption, history of chronic diarrhea however states over the past 2 days prior to admission, has had significant increase in diarrhea which she describes as gallons of liquid malodorous stool. -C. difficile PCR negative.   -GI pathogen panel with enteropathogenic E. coli.  -CT abdomen and pelvis with no acute abnormalities.   -Due to severity of patient's diarrhea in the setting of bile acid malabsorption and presentation with systolic blood pressures in the 50s with ongoing volume loss diarrhea will place on azithromycin 500 mg daily x 5 days.  Discussed with ID.   -Diarrhea has stopped as per this morning -Continue home regimen colestipol, Creon, Bentyl.    3.  Acute kidney injury -Likely secondary to prerenal azotemia as patient presented with significant increased GI losses from acute on chronic diarrhea, in the setting of ACE inhibitor.   -Renal function improved with hydration.  -CT abdomen and pelvis with no hydronephrosis.       4.  Hyperlipidemia -LDL of 28.  -Continue home regimen Lipitor and Zetia.   5.  Dehydration Treated with IV fluids.   6.  Paroxysmal atrial fibrillation -Currently normal sinus rhythm.   -Continue home regimen Coreg.  Eliquis for anticoagulation.   7.  Hypothyroidism -Continue Synthroid.   8.  Hypokalemia/hypomagnesemia -Likely secondary to GI losses. -Supplemented   9.  Diet-controlled diabetes mellitus type 2 -Hemoglobin A1c 6.1 (09/22/2020 ). -Repeat hemoglobin A1c 6.7.   10.  Hypertension -BP stable. -Continue Coreg, Imdur.   -Hold Lotrel, start half home dose of Norvasc 5 mg daily.    11.  Obesity -BMI 30.27 kg/m. -Lifestyle modification -Outpatient follow-up with PCP.   Discharge Diagnoses:  Principal Problem:   Generalized weakness Active Problems:   AKI (acute kidney  injury) (HCC)   CAD in native artery, with prior stents, and  instent restenosis of small vessel, medical therapy, LAD and RCA stents are patent   HLD (hyperlipidemia)   DM (diabetes mellitus), type 2 (HCC) diet controlled   Hypokalemia   Essential hypertension   SOB (shortness of breath)   PAF (paroxysmal atrial fibrillation) (HCC)   Dysphagia   Hypothyroidism   Obesity (BMI 30.0-34.9)   Acute diarrhea   Dehydration   Other fatigue   Dyspnea   Hypomagnesemia   Jaw pain    Discharge Instructions  Discharge Instructions     Diet - low sodium heart healthy   Complete by: As directed    Discharge instructions   Complete by: As directed    1)Please take prescribed medications as instructed 2)Follow up with your PCP in a week 3)You will be called by cardiology for follow-up appointment   Increase activity slowly   Complete by: As directed       Allergies as of 01/26/2023       Reactions   Naproxen Swelling        Medication List     STOP taking these medications    amLODipine-benazepril 10-20 MG capsule Commonly known as: Lotrel       TAKE these medications    amLODipine 5 MG tablet Commonly known as: NORVASC Take 1 tablet (5 mg total) by mouth daily. Start taking on: Jan 27, 2023   apixaban 5 MG Tabs tablet Commonly known as: Eliquis Take 1 tablet (5 mg total) by mouth 2 (two) times daily.   atorvastatin 80 MG tablet Commonly known as: LIPITOR Take 1 tablet by mouth once daily   azithromycin 500 MG tablet Commonly known as: ZITHROMAX Take 1 tablet (500 mg total) by mouth daily for 3 days.   carvedilol 12.5 MG tablet Commonly known as: COREG Take 1 tablet (12.5 mg total) by mouth 2 (two) times daily with a meal.   clopidogrel 75 MG tablet Commonly known as: PLAVIX Take 1 tablet by mouth once daily   colestipol 1 g tablet Commonly known as: COLESTID Take 1 g by mouth 2 (two) times daily.   Creon 36000 UNITS Cpep capsule Generic drug: lipase/protease/amylase Take 72,000 Units by mouth 3 (three)  times daily with meals.   dicyclomine 20 MG tablet Commonly known as: BENTYL Take 20 mg by mouth 3 (three) times daily before meals. Takes two hours before or five hours after other medications   ezetimibe 10 MG tablet Commonly known as: ZETIA Take 1 tablet by mouth once daily   isosorbide mononitrate 60 MG 24 hr tablet Commonly known as: IMDUR Take 1 tablet by mouth once daily   levothyroxine 137 MCG tablet Commonly known as: SYNTHROID Take 150 mcg by mouth daily.   nitroGLYCERIN 0.4 MG SL tablet Commonly known as: NITROSTAT Place 1 tablet (0.4 mg total) under the tongue every 5 (five) minutes as needed for chest pain.   potassium chloride 10 MEQ tablet Commonly known as: KLOR-CON TAKE 2 TABLETS BY MOUTH ONCE DAILY ON MONDAY, WEDNESDAY AND FRIDAY THEN TAKE 1 TABLET ONCE DAILY ON TUESDAY, THURSDAY, SATURDAY AND SUNDAY What changed:  how much to take how to take this when to take this        Follow-up Information     Eartha Inch, MD. Go on 02/01/2023.   Specialty: Family Medicine Why: @4 :00pm Contact information: [address] [place] [address] [phone]  Allergies  Allergen Reactions   Naproxen Swelling    Consultations: Cardiology   Procedures/Studies: ECHOCARDIOGRAM COMPLETE  Result Date: 01/24/2023    ECHOCARDIOGRAM REPORT   Patient Name:   DONTREZ Weld Date of Exam: 01/24/2023 Medical Rec #:  [id number]      Height:       71.0 in Accession #:    [id number]     Weight:       203.1 lb Date of Birth:  1961/06/15      BSA:          2.122 m Patient Age:    62 years       BP:           129/71 mmHg Patient Gender: M              HR:           61 bpm. Exam Location:  Inpatient Procedure: 2D Echo, Cardiac Doppler, Color Doppler and 3D Echo Indications:    R94.31 Abnormal EKG  History:        Patient has prior history of Echocardiogram examinations, most                 recent 09/22/2020. CAD, Abnormal ECG,  Arrythmias:Atrial                 Fibrillation, Signs/Symptoms:Dyspnea, Shortness of Breath, Chest                 Pain and Syncope; Risk Factors:Dyslipidemia, Diabetes and                 Hypertension.  Sonographer:    Sheralyn Boatman RDCS Referring Phys: [id number] DANIEL V THOMPSON IMPRESSIONS  1. Left ventricular ejection fraction, by estimation, is 55 to 60%. The left ventricle has normal function. The left ventricle has no regional wall motion abnormalities. Left ventricular diastolic parameters are consistent with Grade I diastolic dysfunction (impaired relaxation).  2. Right ventricular systolic function is normal. The right ventricular size is mildly enlarged. There is normal pulmonary artery systolic pressure. The estimated right ventricular systolic pressure is 23.6 mmHg.  3. The mitral valve is abnormal. Trivial mitral valve regurgitation.  4. The aortic valve is tricuspid. Aortic valve regurgitation is not visualized. No aortic stenosis is present.  5. The inferior vena cava is normal in size with greater than 50% respiratory variability, suggesting right atrial pressure of 3 mmHg. Comparison(s): No significant change from prior study. 09/22/2020: LVEF 55-60%, grade 1 DD. FINDINGS  Left Ventricle: Left ventricular ejection fraction, by estimation, is 55 to 60%. The left ventricle has normal function. The left ventricle has no regional wall motion abnormalities. The left ventricular internal cavity size was normal in size. There is  no left ventricular hypertrophy. Left ventricular diastolic parameters are consistent with Grade I diastolic dysfunction (impaired relaxation). Indeterminate filling pressures. Right Ventricle: The right ventricular size is mildly enlarged. No increase in right ventricular wall thickness. Right ventricular systolic function is normal. There is normal pulmonary artery systolic pressure. The tricuspid regurgitant velocity is 2.27  m/s, and with an assumed right atrial pressure of 3 mmHg,  the estimated right ventricular systolic pressure is 23.6 mmHg. Left Atrium: Left atrial size was normal in size. Right Atrium: Right atrial size was normal in size. Pericardium: There is no evidence of pericardial effusion. Mitral Valve: The mitral valve is abnormal. There is mild thickening of the mitral valve leaflet(s). Mild mitral annular calcification. Trivial mitral valve regurgitation. Tricuspid Valve: The  tricuspid valve is normal in structure. Tricuspid valve regurgitation is trivial. Aortic Valve: The aortic valve is tricuspid. Aortic valve regurgitation is not visualized. No aortic stenosis is present. Pulmonic Valve: The pulmonic valve was grossly normal. Pulmonic valve regurgitation is trivial. Aorta: The aortic root and ascending aorta are structurally normal, with no evidence of dilitation. Venous: The inferior vena cava is normal in size with greater than 50% respiratory variability, suggesting right atrial pressure of 3 mmHg. IAS/Shunts: No atrial level shunt detected by color flow Doppler.  LEFT VENTRICLE PLAX 2D LVIDd:         4.40 cm      Diastology LVIDs:         2.90 cm      LV e' medial:    4.57 cm/s LV PW:         1.10 cm      LV E/e' medial:  12.2 LV IVS:        0.80 cm      LV e' lateral:   6.85 cm/s LVOT diam:     2.30 cm      LV E/e' lateral: 8.1 LV SV:         92 LV SV Index:   43 LVOT Area:     4.15 cm                              3D Volume EF: LV Volumes (MOD)            3D EF:        50 % LV vol d, MOD A2C: 116.0 ml LV EDV:       148 ml LV vol d, MOD A4C: 136.0 ml LV ESV:       74 ml LV vol s, MOD A2C: 41.2 ml  LV SV:        74 ml LV vol s, MOD A4C: 55.2 ml LV SV MOD A2C:     74.8 ml LV SV MOD A4C:     136.0 ml LV SV MOD BP:      77.1 ml RIGHT VENTRICLE             IVC RV S prime:     18.30 cm/s  IVC diam: 2.00 cm TAPSE (M-mode): 2.8 cm LEFT ATRIUM             Index        RIGHT ATRIUM           Index LA diam:        4.50 cm 2.12 cm/m   RA Area:     17.80 cm LA Vol (A2C):   59.8  ml 28.18 ml/m  RA Volume:   48.40 ml  22.80 ml/m LA Vol (A4C):   48.5 ml 22.85 ml/m LA Biplane Vol: 55.6 ml 26.20 ml/m  AORTIC VALVE LVOT Vmax:   99.80 cm/s LVOT Vmean:  64.900 cm/s LVOT VTI:    0.221 m  AORTA Ao Root diam: 3.40 cm Ao Asc diam:  3.20 cm MITRAL VALVE               TRICUSPID VALVE MV Area (PHT): 2.56 cm    TR Peak grad:   20.6 mmHg MV Decel Time: 296 msec    TR Vmax:        227.00 cm/s MV E velocity: 55.80 cm/s MV A velocity: 86.40 cm/s  SHUNTS MV E/A ratio:  0.65  Systemic VTI:  0.22 m                            Systemic Diam: 2.30 cm Zoila Shutter MD Electronically signed by Zoila Shutter MD Signature Date/Time: 01/24/2023/1:46:36 PM    Final    CT ABDOMEN PELVIS WO CONTRAST  Result Date: 01/23/2023 CLINICAL DATA:  Diarrhea, fatigue EXAM: CT ABDOMEN AND PELVIS WITHOUT CONTRAST TECHNIQUE: Multidetector CT imaging of the abdomen and pelvis was performed following the standard protocol without IV contrast. RADIATION DOSE REDUCTION: This exam was performed according to the departmental dose-optimization program which includes automated exposure control, adjustment of the mA and/or kV according to patient size and/or use of iterative reconstruction technique. COMPARISON:  None Available. FINDINGS: Lower chest: Coronary artery and aortic calcifications. Linear scarring or atelectasis in the left base. No effusions. Hepatobiliary: No focal liver abnormality is seen. Status post cholecystectomy. No biliary dilatation. Pancreas: No focal abnormality or ductal dilatation. Spleen: No focal abnormality.  Normal size. Adrenals/Urinary Tract: Large cyst off the lower pole of the right kidney measures up to 9.3 cm. This appears simple. No follow-up imaging recommended. Punctate nonobstructing stone in the lower pole of the right kidney. No hydronephrosis. Adrenal glands and urinary bladder unremarkable. Stomach/Bowel: Normal appendix. Left colonic diverticulosis. No active diverticulitis. Stomach and  small bowel decompressed, unremarkable. Vascular/Lymphatic: Heavily calcified aorta and iliac vessels. No evidence of aneurysm or adenopathy. Reproductive: No visible focal abnormality. Other: No free fluid or free air. Musculoskeletal: No acute bony abnormality. IMPRESSION: No acute findings in the abdomen or pelvis. Right lower pole punctate nephrolithiasis.  No hydronephrosis. Aortic atherosclerosis. Left colonic diverticulosis.  No active diverticulitis. Electronically Signed   By: Charlett Nose M.D.   On: 01/23/2023 21:25   DG Chest 2 View  Result Date: 01/23/2023 CLINICAL DATA:  Chest pain. EXAM: CHEST - 2 VIEW COMPARISON:  Chest two views 03/30/2021 FINDINGS: Cardiac silhouette and mediastinal contours are within normal limits. Mild calcification within aortic arch. Mildly decreased lung volumes. The lungs are clear. No pleural effusion pneumothorax. Mild multilevel degenerative disc changes of the thoracic spine. Cholecystectomy clips. IMPRESSION: No active cardiopulmonary disease. Electronically Signed   By: Neita Garnet M.D.   On: 01/23/2023 14:37      Subjective: Patient seen and examined at bedside today.  Ambulating on the hallway this morning.  Denies any abdominal pain, nausea or vomiting.  Diarrhea has stopped.  Feels ready to go home  Discharge Exam: Vitals:   01/26/23 0438 01/26/23 0743  BP: (!) 142/80 119/70  Pulse: 60 (!) 55  Resp: 18 20  Temp: 97.8 F (36.6 C) 98.4 F (36.9 C)  SpO2: 98% 100%   Vitals:   01/25/23 1930 01/26/23 0040 01/26/23 0438 01/26/23 0743  BP: (!) 149/79 123/75 (!) 142/80 119/70  Pulse: 60 60 60 (!) 55  Resp: 18 18 18 20   Temp: 97.9 F (36.6 C) 97.6 F (36.4 C) 97.8 F (36.6 C) 98.4 F (36.9 C)  TempSrc: Oral Oral Oral Oral  SpO2: 100% 99% 98% 100%  Weight:   94.3 kg   Height:        General: Pt is alert, awake, not in acute distress Cardiovascular: RRR, S1/S2 +, no rubs, no gallops Respiratory: CTA bilaterally, no wheezing, no  rhonchi Abdominal: Soft, NT, ND, bowel sounds + Extremities: no edema, no cyanosis    The results of significant diagnostics from this hospitalization (including imaging, microbiology, ancillary and laboratory)  are listed below for reference.     Microbiology: Recent Results (from the past 240 hour(s))  Gastrointestinal Panel by PCR , Stool     Status: Abnormal   Collection Time: 01/23/23  3:46 PM   Specimen: Stool  Result Value Ref Range Status   Campylobacter species NOT DETECTED NOT DETECTED Final   Plesimonas shigelloides NOT DETECTED NOT DETECTED Final   Salmonella species NOT DETECTED NOT DETECTED Final   Yersinia enterocolitica NOT DETECTED NOT DETECTED Final   Vibrio species NOT DETECTED NOT DETECTED Final   Vibrio cholerae NOT DETECTED NOT DETECTED Final   Enteroaggregative E coli (EAEC) NOT DETECTED NOT DETECTED Final   Enteropathogenic E coli (EPEC) DETECTED (A) NOT DETECTED Final    Comment: RESULT CALLED TO, READ BACK BY AND VERIFIED WITH: SANDRA BROWN@0548  01/25/23 RH    Enterotoxigenic E coli (ETEC) NOT DETECTED NOT DETECTED Final   Shiga like toxin producing E coli (STEC) NOT DETECTED NOT DETECTED Final   Shigella/Enteroinvasive E coli (EIEC) NOT DETECTED NOT DETECTED Final   Cryptosporidium NOT DETECTED NOT DETECTED Final   Cyclospora cayetanensis NOT DETECTED NOT DETECTED Final   Entamoeba histolytica NOT DETECTED NOT DETECTED Final   Giardia lamblia NOT DETECTED NOT DETECTED Final   Adenovirus F40/41 NOT DETECTED NOT DETECTED Final   Astrovirus NOT DETECTED NOT DETECTED Final   Norovirus GI/GII NOT DETECTED NOT DETECTED Final   Rotavirus A NOT DETECTED NOT DETECTED Final   Sapovirus (I, II, IV, and V) NOT DETECTED NOT DETECTED Final    Comment: Performed at [facility], [address] [place], [place] [address]  C Difficile Quick Screen w PCR reflex     Status: None   Collection Time: 01/23/23  3:46 PM   Specimen: Stool  Result Value Ref Range  Status   C Diff antigen NEGATIVE NEGATIVE Final   C Diff toxin NEGATIVE NEGATIVE Final   C Diff interpretation No C. difficile detected.  Final    Comment: Performed at [facility], [address], [place], [place] [address]     Labs: BNP (last 3 results) Recent Labs    01/23/23 1322  BNP 16.5   Basic Metabolic Panel: Recent Labs  Lab 01/23/23 1316 01/23/23 1528 01/24/23 0323 01/25/23 0031 01/26/23 0110  NA 136  --  141 140 140  K 3.2*  --  3.2* 3.4* 3.3*  CL 104  --  110 109 103  CO2 22  --  22 22 25   GLUCOSE 157*  --  93 103* 102*  BUN 25*  --  19 11 9   CREATININE 1.59*  --  1.03 0.86 0.84  CALCIUM 9.4  --  8.1* 7.8* 7.9*  MG  --  1.9  --  1.4* 1.8   Liver Function Tests: Recent Labs  Lab 01/23/23 1528 01/24/23 0323  AST 26 20  ALT 33 29  ALKPHOS 82 74  BILITOT 1.7* 1.6*  PROT 6.7 6.0*  ALBUMIN 4.0 3.5   Recent Labs  Lab 01/23/23 1528  LIPASE 27   No results for input(s): "AMMONIA" in the last 168 hours. CBC: Recent Labs  Lab 01/23/23 1316 01/24/23 0323 01/25/23 0031 01/26/23 0110  WBC 10.2 7.1 7.1 7.9  NEUTROABS  --  4.4 4.4  --   HGB 16.0 13.8 13.6 14.2  HCT 45.7 39.3 38.5* 39.7  MCV 91.0 87.7 89.5 89.0  PLT 218 159 157 173   Cardiac Enzymes: Recent Labs  Lab 01/23/23 1316  CKTOTAL  145   BNP: Invalid input(s): "POCBNP" CBG: Recent Labs  Lab 01/24/23 0549 01/25/23 0543  GLUCAP 89 91   D-Dimer No results for input(s): "DDIMER" in the last 72 hours. Hgb A1c Recent Labs    01/24/23 0323  HGBA1C 6.7*   Lipid Profile Recent Labs    01/24/23 0323  CHOL 60  HDL 17*  LDLCALC 28  TRIG 76  CHOLHDL 3.5   Thyroid function studies No results for input(s): "TSH", "T4TOTAL", "T3FREE", "THYROIDAB" in the last 72 hours.  Invalid input(s): "FREET3" Anemia work up No results for input(s): "VITAMINB12", "FOLATE", "FERRITIN", "TIBC", "IRON", "RETICCTPCT" in the last 72 hours. Urinalysis No results found for: "COLORURINE",  "APPEARANCEUR", "LABSPEC", "PHURINE", "GLUCOSEU", "HGBUR", "BILIRUBINUR", "KETONESUR", "PROTEINUR", "UROBILINOGEN", "NITRITE", "LEUKOCYTESUR" Sepsis Labs Recent Labs  Lab 01/23/23 1316 01/24/23 0323 01/25/23 0031 01/26/23 0110  WBC 10.2 7.1 7.1 7.9   Microbiology Recent Results (from the past 240 hour(s))  Gastrointestinal Panel by PCR , Stool     Status: Abnormal   Collection Time: 01/23/23  3:46 PM   Specimen: Stool  Result Value Ref Range Status   Campylobacter species NOT DETECTED NOT DETECTED Final   Plesimonas shigelloides NOT DETECTED NOT DETECTED Final   Salmonella species NOT DETECTED NOT DETECTED Final   Yersinia enterocolitica NOT DETECTED NOT DETECTED Final   Vibrio species NOT DETECTED NOT DETECTED Final   Vibrio cholerae NOT DETECTED NOT DETECTED Final   Enteroaggregative E coli (EAEC) NOT DETECTED NOT DETECTED Final   Enteropathogenic E coli (EPEC) DETECTED (A) NOT DETECTED Final    Comment: RESULT CALLED TO, READ BACK BY AND VERIFIED WITH: SANDRA BROWN@0548  01/25/23 RH    Enterotoxigenic E coli (ETEC) NOT DETECTED NOT DETECTED Final   Shiga like toxin producing E coli (STEC) NOT DETECTED NOT DETECTED Final   Shigella/Enteroinvasive E coli (EIEC) NOT DETECTED NOT DETECTED Final   Cryptosporidium NOT DETECTED NOT DETECTED Final   Cyclospora cayetanensis NOT DETECTED NOT DETECTED Final   Entamoeba histolytica NOT DETECTED NOT DETECTED Final   Giardia lamblia NOT DETECTED NOT DETECTED Final   Adenovirus F40/41 NOT DETECTED NOT DETECTED Final   Astrovirus NOT DETECTED NOT DETECTED Final   Norovirus GI/GII NOT DETECTED NOT DETECTED Final   Rotavirus A NOT DETECTED NOT DETECTED Final   Sapovirus (I, II, IV, and V) NOT DETECTED NOT DETECTED Final    Comment: Performed at [facility], [address] [place], [place] [address]  C Difficile Quick Screen w PCR reflex     Status: None   Collection Time: 01/23/23  3:46 PM   Specimen: Stool  Result Value Ref  Range Status   C Diff antigen NEGATIVE NEGATIVE Final   C Diff toxin NEGATIVE NEGATIVE Final   C Diff interpretation No C. difficile detected.  Final    Comment: Performed at [facility], [address] [place], [place] [address]    Please note: You were cared for by a hospitalist during your hospital stay. Once you are discharged, your primary care physician will handle any further medical issues. Please note that NO REFILLS for any discharge medications will be authorized once you are discharged, as it is imperative that you return to your primary care physician (or establish a relationship with a primary care physician if you do not have one) for your post hospital discharge needs so that they can reassess your need for medications and monitor your lab values.    Time coordinating discharge: 40 minutes  SIGNED:  Burnadette Pop, MD  [facility] 01/26/2023, 9:52 AM Pager [phone]  If 7PM-7AM, please contact night-coverage [email or web address] Password [email or web address]

## 2023-01-26 NOTE — TOC Initial Note (Signed)
Transition of Care [facility]) - Initial/Assessment Note    Patient Details  Name: William Mcmahon MRN: [id number] Date of Birth: February 10, 1961  Transition of Care [facility]) CM/SW Contact:    Leone Haven, RN Phone Number: 01/26/2023, 10:01 AM  Clinical Narrative:                 From home with wife, wife will transport home, presents with chestpain and diarrhea, on po abx.  Patient has insurance and PCP on file.          Patient Goals and CMS Choice            Expected Discharge Plan and Services         Expected Discharge Date: 01/26/23                                    Prior Living Arrangements/Services                       Activities of Daily Living Home Assistive Devices/Equipment: None ADL Screening (condition at time of admission) Patient's cognitive ability adequate to safely complete daily activities?: Yes Is the patient deaf or have difficulty hearing?: No Does the patient have difficulty seeing, even when wearing glasses/contacts?: No Does the patient have difficulty concentrating, remembering, or making decisions?: No Patient able to express need for assistance with ADLs?: Yes Does the patient have difficulty dressing or bathing?: No Independently performs ADLs?: Yes (appropriate for developmental age) Does the patient have difficulty walking or climbing stairs?: No Weakness of Legs: None Weakness of Arms/Hands: None  Permission Sought/Granted                  Emotional Assessment              Admission diagnosis:  Generalized weakness [R53.1] AKI (acute kidney injury) (HCC) [N17.9] Other fatigue [R53.83] Dyspnea, unspecified type [R06.00] Patient Active Problem List   Diagnosis Date Noted   Hypomagnesemia 01/25/2023   Jaw pain 01/25/2023   Generalized weakness 01/23/2023   AKI (acute kidney injury) (HCC) 01/23/2023   Acute diarrhea 01/23/2023   Dehydration 01/23/2023   Other fatigue 01/23/2023   Dyspnea  01/23/2023   Leukocytosis 03/31/2021   Hypophosphatemia 03/31/2021   Hypothyroidism 03/31/2021   Hyperglycemia 03/31/2021   Obesity (BMI 30.0-34.9) 03/31/2021   Atypical chest pain 03/30/2021   PAF (paroxysmal atrial fibrillation) (HCC) 12/01/2020   Dysphagia 12/01/2020   Demand ischemia 09/22/2020   Atrial fibrillation with RVR (HCC) 09/21/2020   Educated about COVID-19 virus infection 02/07/2019   Essential hypertension 11/29/2018   Palpitations 11/29/2018   SOB (shortness of breath) 11/29/2018   CAD in native artery, with prior stents, and instent restenosis of small vessel, medical therapy, LAD and RCA stents are patent 09/12/2018   HLD (hyperlipidemia) 09/12/2018   DM (diabetes mellitus), type 2 (HCC) diet controlled 09/12/2018   Hypokalemia 09/12/2018   Syncope- may be due to diuretics 09/12/2018   Unstable angina (HCC) 09/10/2018   PCP:  Eartha Inch, MD Pharmacy:   [facility] [address], [place] - [address] [place] [address] Phone: [phone] Fax: [phone]  [facility] - [place], [place] - [address] [place] [address] Phone: [phone] Fax: [phone]     Social Determinants of Health (SDOH) Social History: SDOH Screenings  Food Insecurity: No Food Insecurity (01/23/2023)  Housing: Low Risk  (01/23/2023)  Transportation Needs: No Transportation Needs (01/23/2023)  Utilities: Not At Risk (01/23/2023)  Tobacco Use: Medium Risk (01/23/2023)   SDOH Interventions:     Readmission Risk Interventions     No data to display

## 2023-01-26 NOTE — TOC Transition Note (Signed)
Transition of Care [facility]) - CM/SW Discharge Note   Patient Details  Name: William Mcmahon MRN: [id number] Date of Birth: Jun 19, 1961  Transition of Care [facility]) CM/SW Contact:  Leone Haven, RN Phone Number: 01/26/2023, 10:07 AM   Clinical Narrative:    Patient is for dc today, has no needs.          Patient Goals and CMS Choice      Discharge Placement                         Discharge Plan and Services Additional resources added to the After Visit Summary for                                       Social Determinants of Health (SDOH) Interventions SDOH Screenings   Food Insecurity: No Food Insecurity (01/23/2023)  Housing: Low Risk  (01/23/2023)  Transportation Needs: No Transportation Needs (01/23/2023)  Utilities: Not At Risk (01/23/2023)  Tobacco Use: Medium Risk (01/23/2023)     Readmission Risk Interventions     No data to display

## 2023-01-27 ENCOUNTER — Encounter: Payer: Self-pay | Admitting: Cardiology

## 2023-02-01 DIAGNOSIS — K861 Other chronic pancreatitis: Secondary | ICD-10-CM | POA: Insufficient documentation

## 2023-03-01 ENCOUNTER — Other Ambulatory Visit: Payer: Self-pay | Admitting: Cardiology

## 2023-03-04 ENCOUNTER — Telehealth: Payer: Self-pay | Admitting: Cardiology

## 2023-03-04 MED ORDER — AMLODIPINE BESYLATE 5 MG PO TABS: 5.0000 mg | ORAL_TABLET | Freq: Every day | ORAL | 0 refills | Status: DC

## 2023-03-04 NOTE — Telephone Encounter (Signed)
Patient needed refill of amlodipine.  The previous provider no longer in office.  He was just in hospital so needs F/U from this.  Appt made for next week with NP.  Refill for 90 days sent until he sees provider.

## 2023-03-04 NOTE — Telephone Encounter (Signed)
*  STAT* If patient is at the pharmacy, call can be transferred to refill team.   1. Which medications need to be refilled? (please list name of each medication and dose if known)  amLODipine (NORVASC) 5 MG tablet   2. Which pharmacy/location (including street and city if local pharmacy) is medication to be sent to? [facility] [address], [place] - [address]  3. Do they need a 30 day or 90 day supply? 90

## 2023-03-07 NOTE — Progress Notes (Unsigned)
Cardiology Clinic Note   Patient Name: William Mcmahon Date of Encounter: 03/07/2023  Primary Care Provider:  Eartha Inch, MD Primary Cardiologist:  Rollene Rotunda, MD  Patient Profile    William Mcmahon 62 year old male presents to the clinic today for follow-up evaluation of his coronary artery disease.  Past Medical History    Past Medical History:  Diagnosis Date   CAD in native artery, with prior stents, and instent restenosis of small vessel, medical therapy, LAD and RCA stents are patent 09/12/2018   Cancer (HCC)    thyroid   DM (diabetes mellitus), type 2 (HCC) diet controlled 09/12/2018   HLD (hyperlipidemia) 09/12/2018   Hyperlipidemia    Hypertension    PAF (paroxysmal atrial fibrillation) (HCC)    Syncope- may be due to diuretics 09/12/2018   Thyroid disease    Past Surgical History:  Procedure Laterality Date   CARDIAC SURGERY     stents   CHOLECYSTECTOMY     LEFT HEART CATH AND CORONARY ANGIOGRAPHY N/A 09/11/2018   Procedure: LEFT HEART CATH AND CORONARY ANGIOGRAPHY;  Surgeon: Marykay Lex, MD;  Location: [facility] INVASIVE CV LAB;  Service: Cardiovascular;  Laterality: N/A;    Allergies  Allergies  Allergen Reactions   Naproxen Swelling    History of Present Illness    William Mcmahon has a PMH of hyperlipidemia, diabetes, hypokalemia, HTN, paroxysmal atrial fibrillation, generalized weakness, hypothyroidism, malabsorptive disorder (bile acid malabsorption), and chronic diarrhea.  His PMH also includes coronary artery disease with PCI and DES to his RCA and LAD 2013, 2019.  He underwent subsequent LHC on 09/11/2018 during that time he was noted to have an EF of 55-65%, mid RCA-distal RCA 30%, 75% ostial RPDA, second RPL B 70%, LAD stent proximal 5%, proximal-mid LAD lesion 55% after D1, ostial ramus 99%, mid circumflex-distal circumflex 50%.  Culprit lesion was likely severe in-stent restenosis and near occlusion of proximal diagonal ramus intermedius that  was previously treated with DES was not a viable option for repeat PCI due to not being able to find the ostium of vessel.  LAD and RCA stents were patent.  He was seen in follow-up by Dr. Antoine Poche on 10/28/2022.  He reported that he had continued to do well.  He was staying active.  He denied cardiac symptoms.  He had been admitted to the hospital 8/22 with atrial fibrillation and was treated with Cardizem.  He denied palpitations, presyncope and syncope.  He continued to deer hunt.  He brought in some of his canned venison.  His EKG showed sinus bradycardia, RBBB, left axis deviation, 50 bpm.  Follow-up was planned for 12 months.  He presented to the emergency department on 01/23/2023 and was discharged on 01/26/2023.  He reported increasing fatigue, DOE, left-sided jaw pain and discomfort as well as acute on chronic diarrhea.  Due to his left-sided jaw pain and dyspnea.  Cardiology was consulted for concerns of anginal equivalent.  He received IV fluids.  His stool sample showed enteropathic E. coli.  He was started on azithromycin.  His diarrhea resolved.  He denied nausea and vomiting.  It was felt that outpatient cardiology follow-up was appropriate.  His EKG showed new RBBB with inferior lateral T wave inversion.  His initial troponin was negative, LDL was 28, echocardiogram showed an LVEF of 55 to 60%, G1 DD, trivial mitral valve regurgitation and no wall motion abnormalities.  His abdominal CT showed no acute abnormalities.  Due to his severe diarrhea in the  setting of bile acid malabsorption and systolic blood pressure in the 50s his case was discussed with infectious disease.  He was continued on his Bentyl, Creon, and colestipol.  He maintained sinus rhythm.  His potassium and magnesium were supplemented.  He presents to the clinic today for follow-up evaluation and states***.  *** denies chest pain, shortness of breath, lower extremity edema, fatigue, palpitations, melena, hematuria, hemoptysis,  diaphoresis, weakness, presyncope, syncope, orthopnea, and PND.   Coronary artery disease-denies further episodes of jaw discomfort.  Treated in the emergency department for diarrhea and reported jaw pain.  Labs and diagnostics unremarkable.  Echocardiogram reassuring. Continue current medical therapy Heart healthy low-sodium diet Maintain physical activity  Hyperlipidemia-LDL 29. Continue rosuvastatin, ezetimibe High-fiber diet  Essential hypertension-BP today***. Maintain blood pressure log Low-sodium diet Continue amlodipine, carvedilol, isosorbide  Paroxysmal atrial fibrillation-denies recent episodes of accelerated or irregular heartbeat.  Compliant with apixaban.  Denies bleeding issues. Continue carvedilol, apixaban Avoid triggers caffeine, chocolate, EtOH, dehydration etc.  Diarrhea, bile acid malabsorption-denies further episodes of loose stools. Continue current medical therapy Follows with PCP  Disposition: Follow-up with Dr. Antoine Poche or me in 4-6 months.   Home Medications    Prior to Admission medications   Medication Sig Start Date End Date Taking? Authorizing Provider  amLODipine (NORVASC) 5 MG tablet Take 1 tablet (5 mg total) by mouth daily. 03/04/23   Rollene Rotunda, MD  apixaban (ELIQUIS) 5 MG TABS tablet Take 1 tablet (5 mg total) by mouth 2 (two) times daily. 11/29/22   Rollene Rotunda, MD  atorvastatin (LIPITOR) 80 MG tablet Take 1 tablet by mouth once daily 03/01/23   Rollene Rotunda, MD  carvedilol (COREG) 12.5 MG tablet Take 1 tablet (12.5 mg total) by mouth 2 (two) times daily with a meal. 09/16/22   Rollene Rotunda, MD  clopidogrel (PLAVIX) 75 MG tablet Take 1 tablet by mouth once daily 10/22/22   Rollene Rotunda, MD  colestipol (COLESTID) 1 g tablet Take 1 g by mouth 2 (two) times daily. 10/21/22   [provider]  dicyclomine (BENTYL) 20 MG tablet Take 20 mg by mouth 3 (three) times daily before meals. Takes two hours before or five hours after  other medications    [provider]  ezetimibe (ZETIA) 10 MG tablet Take 1 tablet by mouth once daily 10/22/22   Rollene Rotunda, MD  isosorbide mononitrate (IMDUR) 60 MG 24 hr tablet Take 1 tablet by mouth once daily 11/22/22   Rollene Rotunda, MD  levothyroxine (SYNTHROID) 137 MCG tablet Take 150 mcg by mouth daily. 08/01/20   [provider]  lipase/protease/amylase (CREON) 36000 UNITS CPEP capsule Take 72,000 Units by mouth 3 (three) times daily with meals.    [provider]  nitroGLYCERIN (NITROSTAT) 0.4 MG SL tablet Place 1 tablet (0.4 mg total) under the tongue every 5 (five) minutes as needed for chest pain. 10/28/22   Rollene Rotunda, MD  potassium chloride (KLOR-CON) 10 MEQ tablet TAKE 2 TABLETS BY MOUTH ONCE DAILY ON MONDAY, WEDNESDAY AND FRIDAY THEN TAKE 1 TABLET ONCE DAILY ON TUESDAY, THURSDAY, SATURDAY AND SUNDAY Patient taking differently: Take 10 mEq by mouth See admin instructions. TAKE 2 TABLETS BY MOUTH ONCE DAILY ON MONDAY, WEDNESDAY AND FRIDAY THEN TAKE 1 TABLET ONCE DAILY ON Chase Caller AND SUNDAY 08/11/22   Rollene Rotunda, MD    Family History    Family History  Problem Relation Age of Onset   Diabetes Mother    CAD Father  s/p stent and CABG   Heart attack Maternal Grandfather    He indicated that his mother is deceased. He indicated that the status of his father is unknown. He indicated that his maternal grandfather is deceased.  Social History    Social History   Socioeconomic History   Marital status: Married    Spouse name: Not on file   Number of children: Not on file   Years of education: Not on file   Highest education level: Not on file  Occupational History   Not on file  Tobacco Use   Smoking status: Former   Smokeless tobacco: Never  Substance and Sexual Activity   Alcohol use: Never   Drug use: Never   Sexual activity: Not on file  Other Topics Concern   Not on file  Social History Narrative    Not on file   Social Determinants of Health   Financial Resource Strain: Not on file  Food Insecurity: No Food Insecurity (01/23/2023)   Hunger Vital Sign    Worried About Running Out of Food in the Last Year: Never true    Ran Out of Food in the Last Year: Never true  Transportation Needs: No Transportation Needs (01/23/2023)   PRAPARE - Administrator, Civil Service (Medical): No    Lack of Transportation (Non-Medical): No  Physical Activity: Not on file  Stress: Not on file  Social Connections: Not on file  Intimate Partner Violence: Not At Risk (01/23/2023)   Humiliation, Afraid, Rape, and Kick questionnaire    Fear of Current or Ex-Partner: No    Emotionally Abused: No    Physically Abused: No    Sexually Abused: No     Review of Systems    General:  No chills, fever, night sweats or weight changes.  Cardiovascular:  No chest pain, dyspnea on exertion, edema, orthopnea, palpitations, paroxysmal nocturnal dyspnea. Dermatological: No rash, lesions/masses Respiratory: No cough, dyspnea Urologic: No hematuria, dysuria Abdominal:   No nausea, vomiting, diarrhea, bright red blood per rectum, melena, or hematemesis Neurologic:  No visual changes, wkns, changes in mental status. All other systems reviewed and are otherwise negative except as noted above.  Physical Exam    VS:  There were no vitals taken for this visit. , BMI There is no height or weight on file to calculate BMI. GEN: Well nourished, well developed, in no acute distress. HEENT: normal. Neck: Supple, no JVD, carotid bruits, or masses. Cardiac: RRR, no murmurs, rubs, or gallops. No clubbing, cyanosis, edema.  Radials/DP/PT 2+ and equal bilaterally.  Respiratory:  Respirations regular and unlabored, clear to auscultation bilaterally. GI: Soft, nontender, nondistended, BS + x 4. MS: no deformity or atrophy. Skin: warm and dry, no rash. Neuro:  Strength and sensation are intact. Psych: Normal  affect.  Accessory Clinical Findings    Recent Labs: 01/23/2023: B Natriuretic Peptide 16.5 01/24/2023: ALT 29 01/26/2023: BUN 9; Creatinine, Ser 0.84; Hemoglobin 14.2; Magnesium 1.8; Platelets 173; Potassium 3.3; Sodium 140   Recent Lipid Panel    Component Value Date/Time   CHOL 60 01/24/2023 0323   CHOL 119 10/19/2021 0839   TRIG 76 01/24/2023 0323   HDL 17 (L) 01/24/2023 0323   HDL 29 (L) 10/19/2021 0839   CHOLHDL 3.5 01/24/2023 0323   VLDL 15 01/24/2023 0323   LDLCALC 28 01/24/2023 0323   LDLCALC 70 10/19/2021 0839    No BP recorded.  {Refresh Note OR Click here to enter BP  :1}***  ECG personally reviewed by me today- *** - No acute changes  Echocardiogram 01/24/2023  IMPRESSIONS     1. Left ventricular ejection fraction, by estimation, is 55 to 60%. The  left ventricle has normal function. The left ventricle has no regional  wall motion abnormalities. Left ventricular diastolic parameters are  consistent with Grade I diastolic  dysfunction (impaired relaxation).   2. Right ventricular systolic function is normal. The right ventricular  size is mildly enlarged. There is normal pulmonary artery systolic  pressure. The estimated right ventricular systolic pressure is 23.6 mmHg.   3. The mitral valve is abnormal. Trivial mitral valve regurgitation.   4. The aortic valve is tricuspid. Aortic valve regurgitation is not  visualized. No aortic stenosis is present.   5. The inferior vena cava is normal in size with greater than 50%  respiratory variability, suggesting right atrial pressure of 3 mmHg.   Comparison(s): No significant change from prior study. 09/22/2020: LVEF  55-60%, grade 1 DD.   FINDINGS   Left Ventricle: Left ventricular ejection fraction, by estimation, is 55  to 60%. The left ventricle has normal function. The left ventricle has no  regional wall motion abnormalities. The left ventricular internal cavity  size was normal in size. There is   no  left ventricular hypertrophy. Left ventricular diastolic parameters  are consistent with Grade I diastolic dysfunction (impaired relaxation).  Indeterminate filling pressures.   Right Ventricle: The right ventricular size is mildly enlarged. No  increase in right ventricular wall thickness. Right ventricular systolic  function is normal. There is normal pulmonary artery systolic pressure.  The tricuspid regurgitant velocity is 2.27   m/s, and with an assumed right atrial pressure of 3 mmHg, the estimated  right ventricular systolic pressure is 23.6 mmHg.   Left Atrium: Left atrial size was normal in size.   Right Atrium: Right atrial size was normal in size.   Pericardium: There is no evidence of pericardial effusion.   Mitral Valve: The mitral valve is abnormal. There is mild thickening of  the mitral valve leaflet(s). Mild mitral annular calcification. Trivial  mitral valve regurgitation.   Tricuspid Valve: The tricuspid valve is normal in structure. Tricuspid  valve regurgitation is trivial.   Aortic Valve: The aortic valve is tricuspid. Aortic valve regurgitation is  not visualized. No aortic stenosis is present.   Pulmonic Valve: The pulmonic valve was grossly normal. Pulmonic valve  regurgitation is trivial.   Aorta: The aortic root and ascending aorta are structurally normal, with  no evidence of dilitation.   Venous: The inferior vena cava is normal in size with greater than 50%  respiratory variability, suggesting right atrial pressure of 3 mmHg.   IAS/Shunts: No atrial level shunt detected by color flow Doppler.    Cardiac catheterization 09/11/2018  The left ventricular systolic function is normal. The left ventricular ejection fraction is 55-65% by visual estimate. LV end diastolic pressure is normal. RCAl Ost RCA to Mid RCA overlapping stents (proximal from 2013, mid from 2019) -- 5% stenosed. --- Mid RCA to Dist RCA lesion is 30% stenosed with 30% stenosed side  branch in Acute Mrg. --- Ost RPDA lesion is 75% stenosed. 2nd RPLB lesion is 70% stenosed. LAD: Prox LAD stent (Xience 3.25 mm x 18 mm-2018) is 5% stenosed. -- Prox LAD to Mid LAD lesion is 55% stenosed - after D1. Ost Ramus lesion is 99% stenosed. Ost Ramus to Ramus stent (Xience 2.25 mm x 15 mm-2018) is 65% stenosed. Mid  Cx to Dist Cx lesion is 50% stenosed. Dist Cx lesion is 60% stenosed.   SUMMARY Culprit lesion is likely the severe in-stent restenosis and near occlusion of the proximal diagonal-ramus intermedius that was previously treated with a DES stent --> not a viable option for re-PCI as I cannot find the ostium of this vessel. Patent LAD and RCA stents. Diffuse mild disease in the RCA with previously reported roughly 80% ostial PDA. Diffuse mild to moderate disease in the mid LAD with roughly long segment of 50% after major diagonal branch. Tandem moderate lesions in major OM branch. Normal LVEF and normal EDP.    Films reviewed with Dr. Excell Seltzer, we both felt that the small ramus intermedius branch is not viable for reattempted PCI.  The other remaining lesions are moderate and we would be best served treated medically. Recommendation would be to continue to manage medically per primary team.   RECOMMENDATION Return to nursing unit with ongoing care.  TR band removal per protocol.  Anticipate discharge tomorrow morning based on the late case. Continue aggressive risk factor modification Continue titrating antianginal medications -would consider Ranexa and or Imdur as he is already on good doses of carvedilol and amlodipine.    Diagnostic Dominance: Right  Intervention    Assessment & Plan   1.  ***   Thomasene Ripple. Katherina Wimer NP-C     03/07/2023, 6:18 AM [facility] [address] Office [phone] Fax [phone]    I spent***minutes examining this patient, reviewing medications, and using patient centered shared decision making  involving her cardiac care.  Prior to her visit I spent greater than 20 minutes reviewing her past medical history,  medications, and prior cardiac tests.

## 2023-03-09 ENCOUNTER — Ambulatory Visit: Payer: Medicare Other | Attending: General Practice | Admitting: General Practice

## 2023-03-09 ENCOUNTER — Encounter: Payer: Self-pay | Admitting: General Practice

## 2023-03-09 VITALS — BP 132/84 | HR 98 | Ht 70.0 in | Wt 214.4 lb

## 2023-03-09 DIAGNOSIS — I10 Essential (primary) hypertension: Secondary | ICD-10-CM

## 2023-03-09 DIAGNOSIS — E785 Hyperlipidemia, unspecified: Secondary | ICD-10-CM | POA: Diagnosis not present

## 2023-03-09 DIAGNOSIS — R197 Diarrhea, unspecified: Secondary | ICD-10-CM

## 2023-03-09 DIAGNOSIS — I480 Paroxysmal atrial fibrillation: Secondary | ICD-10-CM

## 2023-03-09 DIAGNOSIS — I2510 Atherosclerotic heart disease of native coronary artery without angina pectoris: Secondary | ICD-10-CM

## 2023-03-09 NOTE — Patient Instructions (Signed)
Medication Instructions:  The current medical regimen is effective;  continue present plan and medications as directed. Please refer to the Current Medication list given to you today.  *If you need a refill on your cardiac medications before your next appointment, please call your pharmacy*  Lab Work: NONE If you have labs (blood work) drawn today and your tests are completely normal, you will receive your results only by:  MyChart Message (if you have MyChart) OR  A paper copy in the mail If you have any lab test that is abnormal or we need to change your treatment, we will call you to review the results.  Other Instructions TAKE AND LOG YOUR BLOOD PRESSURE AT LEAST A COUPLE TIMES A MONTH PLEASE READ AND FOLLOW ATTACHED  SALTY 6  Follow-Up: At [facility], you and your health needs are our priority.  As part of our continuing mission to provide you with exceptional heart care, we have created designated Provider Care Teams.  These Care Teams include your primary Cardiologist (physician) and Advanced Practice Providers (APPs -  Physician Assistants and Nurse Practitioners) who all work together to provide you with the care you need, when you need it.  Your next appointment:   6-9 month(s)  Provider:   Rollene Rotunda, MD  or Edd Fabian, FNP

## 2023-03-11 ENCOUNTER — Emergency Department (HOSPITAL_COMMUNITY)
Admission: EM | Admit: 2023-03-11 | Discharge: 2023-03-12 | Disposition: A | Discharge status: Home / Self Care | Payer: Medicare Other | Attending: Emergency Medicine | Admitting: Emergency Medicine

## 2023-03-11 ENCOUNTER — Encounter (HOSPITAL_COMMUNITY): Payer: Self-pay

## 2023-03-11 ENCOUNTER — Emergency Department (HOSPITAL_COMMUNITY): Payer: Medicare Other

## 2023-03-11 ENCOUNTER — Other Ambulatory Visit: Payer: Self-pay

## 2023-03-11 DIAGNOSIS — R911 Solitary pulmonary nodule: Secondary | ICD-10-CM

## 2023-03-11 DIAGNOSIS — I4891 Unspecified atrial fibrillation: Secondary | ICD-10-CM

## 2023-03-11 DIAGNOSIS — R079 Chest pain, unspecified: Secondary | ICD-10-CM

## 2023-03-11 LAB — BASIC METABOLIC PANEL
Anion gap: 15 (ref 5–15)
BUN: 15 mg/dL (ref 8–23)
CO2: 25 mmol/L (ref 22–32)
Calcium: 10 mg/dL (ref 8.9–10.3)
Chloride: 102 mmol/L (ref 98–111)
Creatinine, Ser: 1.09 mg/dL (ref 0.61–1.24)
GFR, Estimated: >60 mL/min (ref >60)
Glucose, Bld: 146 mg/dL — ABNORMAL HIGH (ref 70–99)
Potassium: 3.8 mmol/L (ref 3.5–5.1)
Sodium: 142 mmol/L (ref 135–145)

## 2023-03-11 LAB — CBC
HCT: 48.1 % (ref 39.0–52.0)
Hemoglobin: 16.7 g/dL (ref 13.0–17.0)
MCH: 31 pg (ref 26.0–34.0)
MCHC: 34.7 g/dL (ref 30.0–36.0)
MCV: 89.2 fL (ref 80.0–100.0)
Platelets: 248 10*3/uL (ref 150–400)
RBC: 5.39 MIL/uL (ref 4.22–5.81)
RDW: 12 % (ref 11.5–15.5)
WBC: 12.5 10*3/uL — ABNORMAL HIGH (ref 4.0–10.5)
nRBC: 0 % (ref 0.0–0.2)

## 2023-03-11 LAB — URINALYSIS, ROUTINE W REFLEX MICROSCOPIC
Bacteria, UA: NONE SEEN
Bilirubin Urine: NEGATIVE
Glucose, UA: NEGATIVE mg/dL
Hgb urine dipstick: NEGATIVE
Ketones, ur: NEGATIVE mg/dL
Leukocytes,Ua: NEGATIVE
Nitrite: NEGATIVE
Protein, ur: NEGATIVE mg/dL
Specific Gravity, Urine: 1.001 — ABNORMAL LOW (ref 1.005–1.030)
pH: 8 (ref 5.0–8.0)

## 2023-03-11 LAB — MAGNESIUM: Magnesium: 2.2 mg/dL (ref 1.7–2.4)

## 2023-03-11 LAB — TROPONIN I (HIGH SENSITIVITY): Troponin I (High Sensitivity): 11 ng/L (ref <18)

## 2023-03-11 MED ORDER — LACTATED RINGERS IV BOLUS
1000.0000 mL | Freq: Once | INTRAVENOUS | Status: AC
  Administered 2023-03-11: 1000 mL via INTRAVENOUS

## 2023-03-11 MED ORDER — CARVEDILOL 12.5 MG PO TABS
12.5000 mg | ORAL_TABLET | Freq: Once | ORAL | Status: AC
  Administered 2023-03-12: 12.5 mg via ORAL
  Filled 2023-03-11: qty 1

## 2023-03-11 MED ORDER — DILTIAZEM HCL-DEXTROSE 125-5 MG/125ML-% IV SOLN (PREMIX)
5.0000 mg/h | INTRAVENOUS | Status: DC
  Administered 2023-03-11: 5 mg/h via INTRAVENOUS
  Filled 2023-03-11: qty 125

## 2023-03-11 MED ORDER — DILTIAZEM LOAD VIA INFUSION
20.0000 mg | Freq: Once | INTRAVENOUS | Status: AC
  Administered 2023-03-11: 20 mg via INTRAVENOUS
  Filled 2023-03-11: qty 20

## 2023-03-11 MED ORDER — ASPIRIN 81 MG PO CHEW
324.0000 mg | CHEWABLE_TABLET | Freq: Once | ORAL | Status: AC
  Administered 2023-03-11: 324 mg via ORAL
  Filled 2023-03-11: qty 4
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
## 2023-03-11 NOTE — ED Notes (Signed)
Pt gone to Xray 

## 2023-03-11 NOTE — ED Triage Notes (Addendum)
Patient BIB POV from home with complaint of chest pain with SOB starting today at 2000.   Reports pain radiates into the bilateral neck, paid described as sharp constant. Reports vomiting 1x.  Patient reports taking 3 nitroglycerin SL for pain. Reports relief of pain after 2 nitroglycerin then pain returned and patient took 3rd nitroglycerin.   PMHX: A-fib CAD Stents Unstable angina

## 2023-03-11 NOTE — Discharge Instructions (Addendum)
You were seen today for an episode of palpitations and was found to be atrial fibrillation with rapid ventricular response. We converted you out of it with a bolus of diltiazem. You appear to be improving dramatically at this time. Next steps in your care: 1.  Increase your carvedilol dose from 12.5 mg twice daily to 25 mg twice daily.  This means taking 2 pills in the morning and 2 pills at night. 2.  Follow-up with your normal cardiologist within 72 hours as we discussed 3.  Follow-up with your primary care provider because of your lung nodule as this will likely require further evaluation and a low-dose CT scan because of your smoking history to ensure it is not cancerous.

## 2023-03-12 LAB — TROPONIN I (HIGH SENSITIVITY): Troponin I (High Sensitivity): 16 ng/L (ref <18)

## 2023-03-19 ENCOUNTER — Other Ambulatory Visit: Payer: Self-pay | Admitting: Cardiology

## 2023-04-27 IMAGING — DX DG CHEST 2V
2 series · 2 of 2 positions shown · non-contrast
Comparison: 09/21/2020

CLINICAL DATA: Chest pain

EXAM:
CHEST - 2 VIEW

[chest pa]
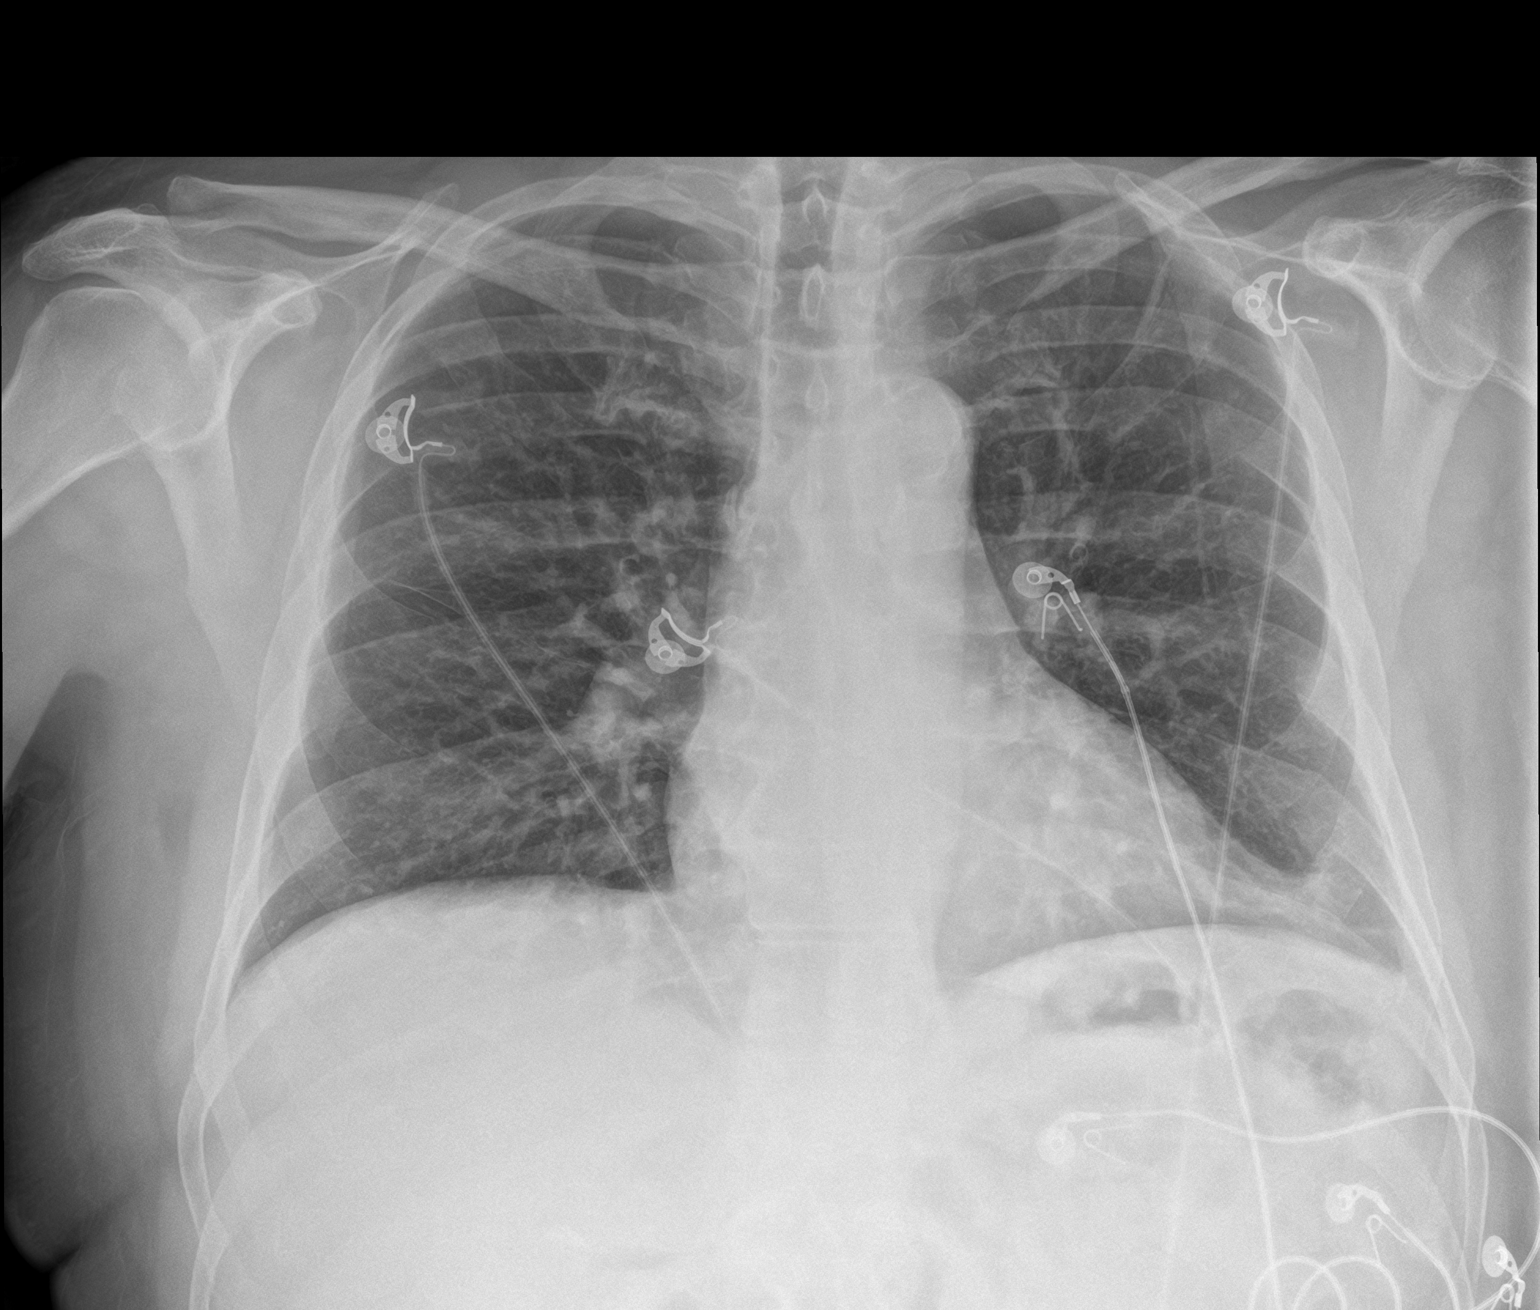

[chest lat]
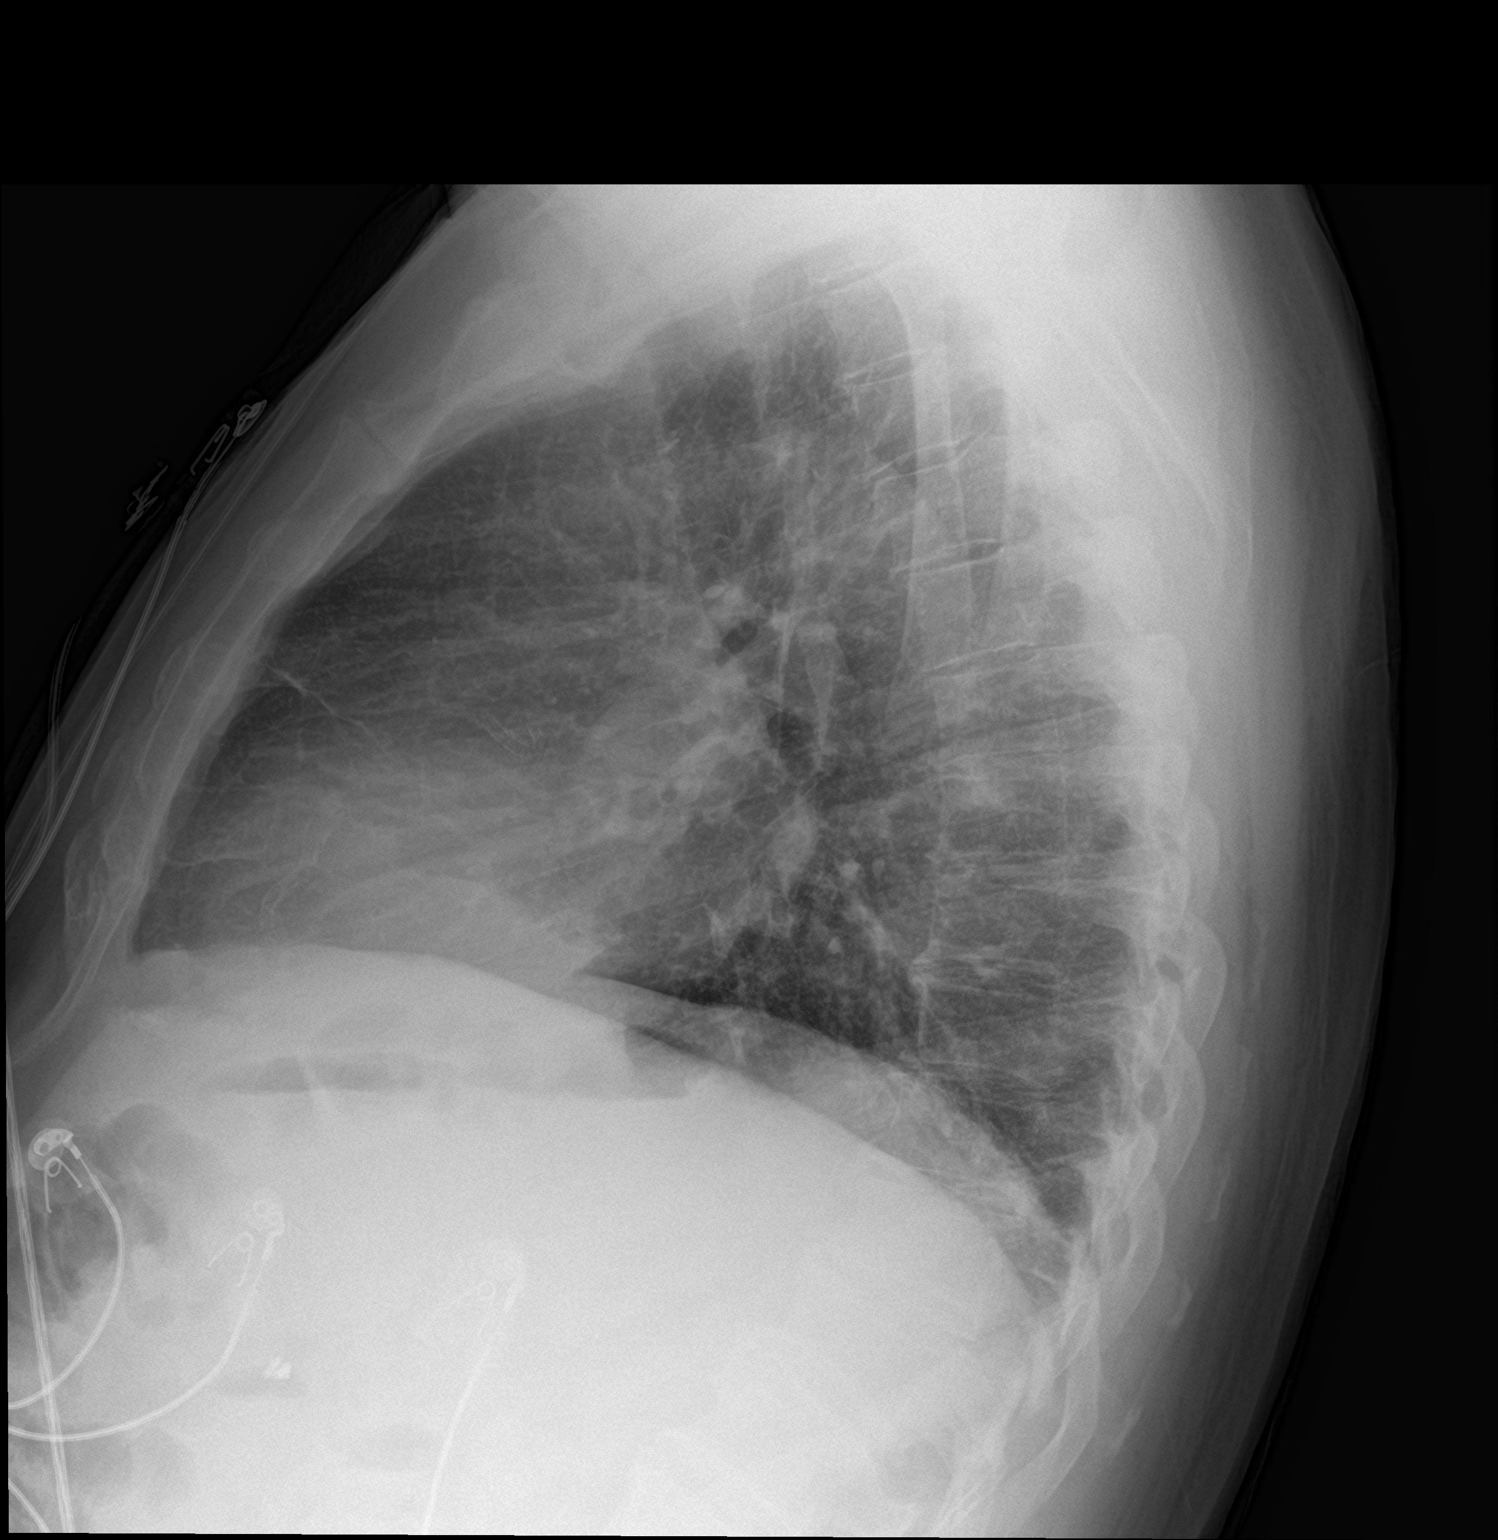

[2 of 2 positions shown; findings below may reference images not displayed]

FINDINGS: The heart size and mediastinal contours are within normal limits.
Atherosclerotic calcification of the aortic knob. No focal airspace
consolidation, pleural effusion, or pneumothorax. The visualized
skeletal structures are unremarkable.
IMPRESSION: No active cardiopulmonary disease.

## 2023-05-05 DIAGNOSIS — N200 Calculus of kidney: Secondary | ICD-10-CM | POA: Insufficient documentation

## 2023-05-05 DIAGNOSIS — N281 Cyst of kidney, acquired: Secondary | ICD-10-CM | POA: Insufficient documentation

## 2023-05-05 DIAGNOSIS — E119 Type 2 diabetes mellitus without complications: Secondary | ICD-10-CM | POA: Insufficient documentation

## 2023-05-13 ENCOUNTER — Telehealth: Payer: Self-pay | Admitting: Cardiology

## 2023-05-13 NOTE — Telephone Encounter (Signed)
Pt c/o medication issue:  1. Name of Medication: carvedilol (COREG) 12.5 MG tablet   2. How are you currently taking this medication (dosage and times per day)? Take 1 tablet (12.5 mg total) by mouth 2 (two) times daily with a meal.   3. Are you having a reaction (difficulty breathing--STAT)? no  4. What is your medication issue? States that the dr wanted him to take 2 pills in the morning and in the afternoon. Patient is out of meds and need a refill, but needs the correct direction put on the prescription. Please advise

## 2023-05-13 NOTE — Telephone Encounter (Signed)
Called number and recording states call cannot be completed as dialed Unable to reach pt .Will await return call from pt /cy

## 2023-05-13 NOTE — Telephone Encounter (Signed)
Call X2 and rings busy

## 2023-05-16 MED ORDER — CARVEDILOL 12.5 MG PO TABS: 12.5000 mg | ORAL_TABLET | Freq: Two times a day (BID) | ORAL | 3 refills | Status: DC

## 2023-05-16 NOTE — Telephone Encounter (Signed)
Patient Mobile number will not dial and then goes to a busy signal X2.  Call to wife's listed number and LM to have him to call office

## 2023-05-16 NOTE — Telephone Encounter (Signed)
Unable to get in touch with patient . Refill sent to pharmacy.  If any issues he will need to contact us or his pharmacy

## 2023-05-24 ENCOUNTER — Other Ambulatory Visit: Payer: Self-pay | Admitting: Cardiology

## 2023-06-02 ENCOUNTER — Other Ambulatory Visit: Payer: Self-pay | Admitting: Cardiology

## 2023-06-23 ENCOUNTER — Encounter: Payer: Self-pay | Admitting: Urology

## 2023-06-23 ENCOUNTER — Ambulatory Visit: Payer: Medicare Other | Admitting: Urology

## 2023-06-23 VITALS — BP 120/65 | HR 51 | Ht 70.0 in | Wt 214.0 lb

## 2023-06-23 DIAGNOSIS — N281 Cyst of kidney, acquired: Secondary | ICD-10-CM

## 2023-06-23 DIAGNOSIS — R3129 Other microscopic hematuria: Secondary | ICD-10-CM | POA: Diagnosis not present

## 2023-06-23 DIAGNOSIS — R109 Unspecified abdominal pain: Secondary | ICD-10-CM

## 2023-06-23 DIAGNOSIS — N200 Calculus of kidney: Secondary | ICD-10-CM

## 2023-06-23 LAB — MICROSCOPIC EXAMINATION
Bacteria, UA: NONE SEEN
RBC, Urine: >30 /[HPF] — AB (ref 0–2)

## 2023-06-23 LAB — URINALYSIS, ROUTINE W REFLEX MICROSCOPIC
Bilirubin, UA: NEGATIVE
Glucose, UA: NEGATIVE
Leukocytes,UA: NEGATIVE
Nitrite, UA: NEGATIVE
Specific Gravity, UA: 1.025 (ref 1.005–1.030)
Urobilinogen, Ur: 1 mg/dL (ref 0.2–1.0)
pH, UA: 6 (ref 5.0–7.5)

## 2023-06-23 NOTE — Progress Notes (Signed)
Subjective: 1. Microhematuria   2. Renal stones   3. Renal cyst   4. Flank pain      06/23/23: William Mcmahon is a 62 yo male who has a one month history of right flank pain that is variably severe and can keep him up at night.  He has had no hematuria.  He has had no nausea.  He had a CT AP without contrast in 5/24 that shows a 9cm RLP cyst and small non-obstructing right renal stones.   He had a CT and [facility] in 2020 and the cyst was not mentioned but it was 8.1cm on a CT with [facility] in 12/23.Marland Kitchen  He feels a bulge on the right.  He has chronic nocturia x 4 and some urgency. HIs IPSS is 7.  He has had prior stones and he had ESWL here several years ago.  His last Cr was 1.09 in July.  He was hospitalized in July for e. Coli and then again for afib.   He is on Eliquis and Plavix with a history of afib and CAD with prior stents.  He is a former smoker. His PSA was 0.5 in 2022.    IPSS     Row Name 06/23/23 1300         International Prostate Symptom Score   How often have you had the sensation of not emptying your bladder? Not at All     How often have you had to urinate less than every two hours? Not at All     How often have you found you stopped and started again several times when you urinated? Not at All     How often have you found it difficult to postpone urination? About half the time     How often have you had a weak urinary stream? Not at All     How often have you had to strain to start urination? Not at All     How many times did you typically get up at night to urinate? 4 Times     Total IPSS Score 7       Quality of Life due to urinary symptoms   If you were to spend the rest of your life with your urinary condition just the way it is now how would you feel about that? Terrible              ROS:  Review of Systems  Cardiovascular:  Positive for chest pain and leg swelling.  Gastrointestinal:  Positive for constipation, diarrhea, heartburn and nausea.  Genitourinary:   Positive for urgency.  Endo/Heme/Allergies:  Bruises/bleeds easily.  All other systems reviewed and are negative.   Allergies  Allergen Reactions   Naproxen Swelling    Past Medical History:  Diagnosis Date   CAD in native artery, with prior stents, and instent restenosis of small vessel, medical therapy, LAD and RCA stents are patent 09/12/2018   Cancer (HCC)    thyroid   DM (diabetes mellitus), type 2 (HCC) diet controlled 09/12/2018   HLD (hyperlipidemia) 09/12/2018   Hyperlipidemia    Hypertension    PAF (paroxysmal atrial fibrillation) (HCC)    Syncope- may be due to diuretics 09/12/2018   Thyroid disease     Past Surgical History:  Procedure Laterality Date   CARDIAC SURGERY     stents   CHOLECYSTECTOMY     LEFT HEART CATH AND CORONARY ANGIOGRAPHY N/A 09/11/2018   Procedure: LEFT HEART CATH AND CORONARY ANGIOGRAPHY;  Surgeon: Marykay Lex, MD;  Location: [facility] INVASIVE CV LAB;  Service: Cardiovascular;  Laterality: N/A;    Social History   Socioeconomic History   Marital status: Married    Spouse name: Not on file   Number of children: Not on file   Years of education: Not on file   Highest education level: Not on file  Occupational History   Not on file  Tobacco Use   Smoking status: Former   Smokeless tobacco: Never  Substance and Sexual Activity   Alcohol use: Never   Drug use: Never   Sexual activity: Not on file  Other Topics Concern   Not on file  Social History Narrative   Not on file   Social Determinants of Health   Financial Resource Strain: Low Risk  (03/23/2023)   Received from [facility]   Overall Financial Resource Strain (CARDIA)    Difficulty of Paying Living Expenses: Not hard at all  Food Insecurity: No Food Insecurity (03/23/2023)   Received [facility]   Hunger Vital Sign    Worried About Running Out of Food in the Last Year: Never true    Ran Out of Food in the Last Year: Never true  Transportation Needs: No  Transportation Needs (03/23/2023)   Received from [facility] - Transportation    Lack of Transportation (Medical): No    Lack of Transportation (Non-Medical): No  Physical Activity: Inactive (03/23/2023)   Received from [facility]   Exercise Vital Sign    Days of Exercise per Week: 0 days    Minutes of Exercise per Session: 80 min  Stress: No Stress Concern Present (03/23/2023)   Received from [facility] - Occupational Stress Questionnaire    Feeling of Stress : Not at all  Social Connections: Moderately Integrated (03/23/2023)   Received from [facility]   Social Network    How would you rate your social network (family, work, friends)?: Adequate participation with social networks  Intimate Partner Violence: Not At Risk (03/23/2023)   Received from [facility]   HITS    Over the last 12 months how often did your partner physically hurt you?: 1    Over the last 12 months how often did your partner insult you or talk down to you?: 3    Over the last 12 months how often did your partner threaten you with physical harm?: 1    Over the last 12 months how often did your partner scream or curse at you?: 3    Family History  Problem Relation Age of Onset   Diabetes Mother    CAD Father        s/p stent and CABG   Heart attack Maternal Grandfather     Anti-infectives: Anti-infectives (From admission, onward)    None       Current Outpatient Medications  Medication Sig Dispense Refill   amLODipine (NORVASC) 5 MG tablet Take 1 tablet by mouth once daily 90 tablet 3   apixaban (ELIQUIS) 5 MG TABS tablet Take 1 tablet (5 mg total) by mouth 2 (two) times daily. 180 tablet 1   atorvastatin (LIPITOR) 80 MG tablet Take 1 tablet by mouth once daily 90 tablet 3   carvedilol (COREG) 12.5 MG tablet Take 1 tablet (12.5 mg total) by mouth 2 (two) times daily with a meal. 180 tablet 3   clopidogrel (PLAVIX) 75 MG tablet Take 1  tablet by  mouth once daily 90 tablet 3   colestipol (COLESTID) 1 g tablet Take 1 g by mouth 2 (two) times daily.     dicyclomine (BENTYL) 20 MG tablet Take 20 mg by mouth 3 (three) times daily before meals. Takes two hours before or five hours after other medications     ezetimibe (ZETIA) 10 MG tablet Take 1 tablet by mouth once daily 90 tablet 2   isosorbide mononitrate (IMDUR) 60 MG 24 hr tablet Take 1 tablet by mouth once daily 90 tablet 0   levothyroxine (SYNTHROID) 137 MCG tablet Take 150 mcg by mouth daily.     lipase/protease/amylase (CREON) 36000 UNITS CPEP capsule Take 72,000 Units by mouth 3 (three) times daily with meals.     nitroGLYCERIN (NITROSTAT) 0.4 MG SL tablet Place 1 tablet (0.4 mg total) under the tongue every 5 (five) minutes as needed for chest pain. 25 tablet 2   potassium chloride (KLOR-CON) 10 MEQ tablet TAKE 2 TABLETS BY MOUTH ONCE DAILY ON MONDAY, WEDNESDAY AND FRIDAY THEN TAKE 1 TABLET ONCE DAILY ON TUESDAY, THURSDAY, SATURDAY AND SUNDAY (Patient taking differently: Take 10 mEq by mouth See admin instructions. TAKE 2 TABLETS BY MOUTH ONCE DAILY ON MONDAY, WEDNESDAY AND FRIDAY THEN TAKE 1 TABLET ONCE DAILY ON TUESDAY, THURSDAY, SATURDAY AND SUNDAY) 135 tablet 3   No current facility-administered medications for this visit.     Objective: Vital signs in last 24 hours: BP 120/65   Pulse (!) 51   Ht 5\' 10"  (1.778 m)   Wt 214 lb (97.1 kg)   BMI 30.71 kg/m   Intake/Output from previous day: No intake/output data recorded. Intake/Output this shift: @IOTHISSHIFT @   Physical Exam Vitals reviewed.  Constitutional:      Appearance: Normal appearance. He is obese.  Cardiovascular:     Rate and Rhythm: Normal rate and regular rhythm.     Heart sounds: Normal heart sounds.  Pulmonary:     Effort: Pulmonary effort is normal.     Breath sounds: Normal breath sounds.  Abdominal:     Palpations: Abdomen is soft. There is no mass.     Tenderness: There is no  abdominal tenderness.  Musculoskeletal:        General: Normal range of motion.  Skin:    General: Skin is warm and dry.  Neurological:     General: No focal deficit present.     Mental Status: He is alert and oriented to person, place, and time.     Lab Results:  No results found for this or any previous visit (from the past 24 hour(s)).  BMET No results for input(s): "NA", "K", "CL", "CO2", "GLUCOSE", "BUN", "CREATININE", "CALCIUM" in the last 72 hours. PT/INR No results for input(s): "LABPROT", "INR" in the last 72 hours. ABG No results for input(s): "PHART", "HCO3" in the last 72 hours.  Invalid input(s): "PCO2", "PO2" UA has >30 RBC's  Labs in EPIC and CareEverywhere reviewed.   Studies/Results: No results found. I have reviewed his CT from 5/24 as well as CT reports from [address] and [facility] from 2023 and 2020.   Assessment/Plan: Right flank pain with enlarging right renal cyst, stones and microhematuria.   I will get a CT hematuria study to assess the upper tracts.  If there is not an obstructing stone, we can consider cyst aspiration.  Microhematuria.  If a cause isn't found on CT, he will need cystoscopy with his smoking history.    No orders of the defined types were  placed in this encounter.    Orders Placed This Encounter  Procedures   CT HEMATURIA WORKUP    Standing Status:   Future    Standing Expiration Date:   09/23/2023    Order Specific Question:   Reason for Exam (SYMPTOM  OR DIAGNOSIS REQUIRED)    Answer:   hematuria and flank pain.    Order Specific Question:   Preferred imaging location?    Answer:   [facility]    Order Specific Question:   Radiology Contrast Protocol - do NOT remove file path    Answer:   [email or web address]   Urinalysis, Routine w reflex microscopic     Return for Next available with CT results. .    CC: Dr. Antony Haste.      William Mcmahon 06/23/2023

## 2023-06-28 ENCOUNTER — Encounter: Payer: Self-pay | Admitting: Urology

## 2023-06-29 ENCOUNTER — Other Ambulatory Visit: Payer: Self-pay | Admitting: Cardiology

## 2023-07-19 ENCOUNTER — Other Ambulatory Visit: Payer: Self-pay | Admitting: Cardiology

## 2023-07-27 ENCOUNTER — Other Ambulatory Visit: Payer: Self-pay | Admitting: Cardiology

## 2023-07-27 DIAGNOSIS — E785 Hyperlipidemia, unspecified: Secondary | ICD-10-CM

## 2023-09-19 ENCOUNTER — Telehealth: Payer: Self-pay

## 2023-09-19 NOTE — Telephone Encounter (Signed)
Patient needing CT HEMATURIA WORKUP approved and scheduled.  Please advise patient.  Call: [phone]

## 2023-09-21 ENCOUNTER — Ambulatory Visit (HOSPITAL_COMMUNITY)
Admission: RE | Admit: 2023-09-21 | Discharge: 2023-09-21 | Disposition: A | Discharge status: Home / Self Care | Payer: Medicare Other | Source: Ambulatory Visit | Attending: Urology | Admitting: Urology

## 2023-09-21 DIAGNOSIS — R109 Unspecified abdominal pain: Secondary | ICD-10-CM | POA: Diagnosis present

## 2023-09-21 DIAGNOSIS — N200 Calculus of kidney: Secondary | ICD-10-CM | POA: Diagnosis present

## 2023-09-21 DIAGNOSIS — N281 Cyst of kidney, acquired: Secondary | ICD-10-CM | POA: Diagnosis present

## 2023-09-21 DIAGNOSIS — R3129 Other microscopic hematuria: Secondary | ICD-10-CM | POA: Diagnosis present

## 2023-09-21 LAB — POCT I-STAT CREATININE: Creatinine, Ser: 1 mg/dL (ref 0.61–1.24)

## 2023-09-21 MED ORDER — IOHEXOL 300 MG/ML  SOLN
125.0000 mL | Freq: Once | INTRAMUSCULAR | Status: AC | PRN
  Administered 2023-09-21: 125 mL via INTRAVENOUS

## 2023-09-22 ENCOUNTER — Telehealth: Payer: Self-pay

## 2023-09-22 NOTE — Telephone Encounter (Signed)
-----   Message from Bjorn Pippin sent at 09/21/2023  2:27 PM EST ----- I don't see a clear cause of the blood in the urine.   He needs f/u for cystoscopy and if he is having right sided pain, we could have the cyst on the kidney drained to see if that will help. ----- Message ----- From: Sarajane Jews, CMA Sent: 09/21/2023  11:25 AM EST To: Bjorn Pippin, MD  Please review.

## 2023-09-22 NOTE — Telephone Encounter (Signed)
Called Pt to relay message from MD and scheduled a cysto with MD wrenn

## 2023-10-05 NOTE — Progress Notes (Signed)
 New Patient Office Visit   Subjective   Patient ID: William Mcmahon, male    DOB: 05-19-1961  Age: 63 y.o. MRN: [id number]  CC:  Chief Complaint  Patient presents with   Establish Care    Full panel lab work up. Including Thyroid , A1C, and Potassium     HPI William Mcmahon 63 year old male, presents to establish care. He  has a past medical history of CAD in native artery, with prior stents, and instent restenosis of small vessel, medical therapy, LAD and RCA stents are patent (09/12/2018), Cancer (HCC), DM (diabetes mellitus), type 2 (HCC) diet controlled (09/12/2018), HLD (hyperlipidemia) (09/12/2018), Hyperlipidemia, Hypertension, PAF (paroxysmal atrial fibrillation) (HCC), Syncope- may be due to diuretics (09/12/2018), and Thyroid  disease.  Hypertension: Patient here for elevated blood pressure. He is exercising and is adherent to low salt diet.  Blood pressure is well controlled at home blood pressures below 140/80, Patient denies Cardiac symptoms chest pain, exertional chest pressure/discomfort, lower extremity edema, and palpitations. Patient reports shortness of breath at times.  Cardiovascular risk factors: advanced age (older than 51 for men) diabetes mellitus, dyslipidemia, hypertension, microalbuminuria, and obesity (BMI >= 30 kg/m2).  Diabetes He presents for his initial diabetic visit. He has type 2 diabetes mellitus. His disease course has been fluctuating. Hypoglycemia symptoms include dizziness. Pertinent negatives for hypoglycemia include no sweats or tremors. Pertinent negatives for diabetes include no blurred vision, no chest pain, no foot ulcerations and no visual change. Pertinent negatives for hypoglycemia complications include no blackouts. Pertinent negatives for diabetic complications include no peripheral neuropathy. Risk factors for coronary artery disease include diabetes mellitus, dyslipidemia, hypertension and male sex. When asked about current treatments, none were  reported. He is following a generally healthy diet. Meal planning includes avoidance of concentrated sweets. He participates in exercise daily. An ACE inhibitor/angiotensin II receptor blocker is not being taken. He does not see a podiatrist.Eye exam is not current.      Outpatient Encounter Medications as of 10/06/2023  Medication Sig   amLODipine  (NORVASC ) 5 MG tablet Take 1 tablet by mouth once daily   apixaban  (ELIQUIS ) 5 MG TABS tablet Take 1 tablet (5 mg total) by mouth 2 (two) times daily.   atorvastatin  (LIPITOR ) 80 MG tablet Take 1 tablet by mouth once daily   carvedilol  (COREG ) 12.5 MG tablet Take 1 tablet (12.5 mg total) by mouth 2 (two) times daily with a meal.   clopidogrel  (PLAVIX ) 75 MG tablet Take 1 tablet by mouth once daily   colestipol  (COLESTID ) 1 g tablet Take 1 g by mouth 2 (two) times daily.   dicyclomine  (BENTYL ) 20 MG tablet Take 20 mg by mouth 3 (three) times daily before meals. Takes two hours before or five hours after other medications   ezetimibe  (ZETIA ) 10 MG tablet Take 1 tablet by mouth once daily   isosorbide  mononitrate (IMDUR ) 60 MG 24 hr tablet Take 1 tablet by mouth once daily   levothyroxine  (SYNTHROID ) 137 MCG tablet Take 150 mcg by mouth daily.   lipase/protease/amylase (CREON ) 36000 UNITS CPEP capsule Take 72,000 Units by mouth 3 (three) times daily with meals.   magnesium  (MAGTAB) 84 MG ( ) TBCR SR tablet Take 84 mg by mouth daily.   nitroGLYCERIN  (NITROSTAT ) 0.4 MG SL tablet Place 1 tablet (0.4 mg total) under the tongue every 5 (five) minutes as needed for chest pain.   potassium chloride  (KLOR-CON ) 10 MEQ tablet TAKE 2 TABLETS BY MOUTH ON MONDAY, WEDNESDAY AND FRIDAY, THEN 1 TABLET  ONCE DAILY ON TUESDAY, THURSDAY, SATURDAY AND SUNDAY   No facility-administered encounter medications on file as of 10/06/2023.    Past Surgical History:  Procedure Laterality Date   CARDIAC SURGERY     stents   CHOLECYSTECTOMY     LEFT HEART CATH AND CORONARY  ANGIOGRAPHY N/A 09/11/2018   Procedure: LEFT HEART CATH AND CORONARY ANGIOGRAPHY;  Surgeon: Anner Alm ORN, MD;  Location: [facility] INVASIVE CV LAB;  Service: Cardiovascular;  Laterality: N/A;    Review of Systems  Constitutional:  Negative for chills and fever.  Eyes:  Negative for blurred vision.  Respiratory:  Positive for shortness of breath.   Cardiovascular:  Negative for chest pain.  Gastrointestinal:  Negative for abdominal pain.  Genitourinary:  Negative for dysuria.  Musculoskeletal:  Negative for myalgias.  Skin:  Negative for rash.  Neurological:  Positive for dizziness. Negative for tremors.      Objective    BP 138/87   Pulse (!) 50   Ht 5' 10 (1.778 m)   Wt 214 lb 0.6 oz (97.1 kg)   SpO2 99%   BMI 30.71 kg/m   Physical Exam Vitals reviewed.  Constitutional:      General: He is not in acute distress.    Appearance: Normal appearance. He is not ill-appearing, toxic-appearing or diaphoretic.  HENT:     Head: Normocephalic.  Eyes:     General:        Right eye: No discharge.        Left eye: No discharge.     Conjunctiva/sclera: Conjunctivae normal.  Cardiovascular:     Rate and Rhythm: Normal rate.     Pulses: Normal pulses.  Pulmonary:     Effort: Pulmonary effort is normal. No respiratory distress.     Breath sounds: Normal breath sounds.  Abdominal:     General: Bowel sounds are normal.     Palpations: Abdomen is soft.     Tenderness: There is no abdominal tenderness. There is no right CVA tenderness, left CVA tenderness or guarding.  Musculoskeletal:        General: Normal range of motion.     Cervical back: Normal range of motion.     Right lower leg: No edema.     Left lower leg: No edema.  Skin:    General: Skin is warm and dry.  Neurological:     Mental Status: He is alert.     Coordination: Coordination normal.     Gait: Gait normal.  Psychiatric:        Mood and Affect: Mood normal.        Behavior: Behavior normal.       Assessment &  Plan:  Primary hypertension -     Lipid panel -     CMP14+EGFR -     CBC with Differential/Platelet  Type 2 diabetes mellitus with other specified complication, unspecified whether long term insulin  use (HCC) -     Microalbumin / creatinine urine ratio -     Hemoglobin A1c -     Ambulatory referral to Ophthalmology  Need for hepatitis C screening test -     Hepatitis C antibody  Vitamin D  deficiency -     VITAMIN D  25 Hydroxy (Vit-D Deficiency, Fractures)  TSH (thyroid -stimulating hormone deficiency) -     TSH + free T4  Essential hypertension Assessment & Plan: Vitals:   10/06/23 0808 10/06/23 0850 10/06/23 0932  BP: (!) 168/76 (!) 161/90 138/87  Advise to follow  up in 2 weeks via my chart with at home blood pressure readings to monitor trends Patient reports taking amlodipine  5 mg once daily, carvedilol  12.5 mg twice daily, Imdur  60 mg once daily. Patient is currently being followed by Cardiology  Labs ordered. Discussed with  patient to monitor their blood pressure regularly and maintain a heart-healthy diet rich in fruits, vegetables, whole grains, and low-fat dairy, while reducing sodium intake to less than 2,300 mg per day. Regular physical activity, such as 30 minutes of moderate exercise most days of the week, will help lower blood pressure and improve overall cardiovascular health. Avoiding smoking, limiting alcohol consumption, and managing stress. Take  prescribed medication, & take it as directed and avoid skipping doses. Seek emergency care if your blood pressure is (over 180/100) or you experience chest pain, shortness of breath, or sudden vision changes.Patient verbalizes understanding regarding plan of care and all questions answered.    Type 2 diabetes mellitus without complication, without long-term current use of insulin  (HCC) Assessment & Plan: Last Hemoglobin A1c: 7.1 Labs: Ordered today, results pending; will follow up accordingly. The patient reports  adhering to NO prescribed medications just lifestyle changes such as diet and exercise.  If hemoglobin A1c labs still elevated medication management will be advised.  Reviewed non-pharmacological interventions, including a balanced diet rich in lean proteins, healthy fats, whole grains, and high-fiber vegetables. Emphasized reducing refined sugars and processed carbohydrates, and incorporating more fruits, leafy greens, and legumes. Education: Patient was educated on recognizing signs and symptoms of both hypoglycemia and hyperglycemia, and advised to seek emergency care if these symptoms occur. Follow-Up: Scheduled for follow-up in 3-4 months, or sooner if needed. Patient Understanding: The patient verbalized understanding of the care plan, and all questions were answered. Additional Care: Ophthalmology referral was placed. Foot exam results were within normal limits.      Return in about 3 months (around 01/03/2024), or if symptoms worsen or fail to improve, for chronic follow-up.   Hilario Kidd Wilhelmena Falter, FNP

## 2023-10-05 NOTE — Patient Instructions (Addendum)
        Great to see you today.  I have refilled the medication(s) we provide.   Please follow up via my chart in 2 weeks with at home blood pressure readings to monitor trends.   If labs were collected, we will inform you of lab results once received either by echart message or telephone call.   - echart message- for normal results that have been seen by the patient already.   - telephone call: abnormal results or if patient has not viewed results in their echart.   - Please take medications as prescribed. - Follow up with your primary health provider if any health concerns arises. - If symptoms worsen please contact your primary care provider and/or visit the emergency department.

## 2023-10-06 ENCOUNTER — Ambulatory Visit: Payer: Medicare Other | Admitting: Family Medicine

## 2023-10-06 ENCOUNTER — Encounter: Payer: Self-pay | Admitting: Family Medicine

## 2023-10-06 VITALS — BP 138/87 | HR 50 | Ht 70.0 in | Wt 214.0 lb

## 2023-10-06 DIAGNOSIS — E038 Other specified hypothyroidism: Secondary | ICD-10-CM

## 2023-10-06 DIAGNOSIS — Z1159 Encounter for screening for other viral diseases: Secondary | ICD-10-CM | POA: Diagnosis not present

## 2023-10-06 DIAGNOSIS — E559 Vitamin D deficiency, unspecified: Secondary | ICD-10-CM | POA: Diagnosis not present

## 2023-10-06 DIAGNOSIS — I10 Essential (primary) hypertension: Secondary | ICD-10-CM | POA: Diagnosis not present

## 2023-10-06 DIAGNOSIS — E1169 Type 2 diabetes mellitus with other specified complication: Secondary | ICD-10-CM

## 2023-10-06 DIAGNOSIS — E119 Type 2 diabetes mellitus without complications: Secondary | ICD-10-CM

## 2023-10-06 NOTE — Assessment & Plan Note (Signed)
 Vitals:   10/06/23 0808 10/06/23 0850 10/06/23 0932  BP: (!) 168/76 (!) 161/90 138/87  Advise to follow up in 2 weeks via my chart with at home blood pressure readings to monitor trends Patient reports taking amlodipine  5 mg once daily, carvedilol  12.5 mg twice daily, Imdur  60 mg once daily. Patient is currently being followed by Cardiology  Labs ordered. Discussed with  patient to monitor their blood pressure regularly and maintain a heart-healthy diet rich in fruits, vegetables, whole grains, and low-fat dairy, while reducing sodium intake to less than 2,300 mg per day. Regular physical activity, such as 30 minutes of moderate exercise most days of the week, will help lower blood pressure and improve overall cardiovascular health. Avoiding smoking, limiting alcohol consumption, and managing stress. Take  prescribed medication, & take it as directed and avoid skipping doses. Seek emergency care if your blood pressure is (over 180/100) or you experience chest pain, shortness of breath, or sudden vision changes.Patient verbalizes understanding regarding plan of care and all questions answered.

## 2023-10-06 NOTE — Assessment & Plan Note (Addendum)
 Last Hemoglobin A1c: 7.1 Labs: Ordered today, results pending; will follow up accordingly. The patient reports adhering to NO prescribed medications just lifestyle changes such as diet and exercise.  If hemoglobin A1c labs still elevated medication management will be advised.  Reviewed non-pharmacological interventions, including a balanced diet rich in lean proteins, healthy fats, whole grains, and high-fiber vegetables. Emphasized reducing refined sugars and processed carbohydrates, and incorporating more fruits, leafy greens, and legumes. Education: Patient was educated on recognizing signs and symptoms of both hypoglycemia and hyperglycemia, and advised to seek emergency care if these symptoms occur. Follow-Up: Scheduled for follow-up in 3-4 months, or sooner if needed. Patient Understanding: The patient verbalized understanding of the care plan, and all questions were answered. Additional Care: Ophthalmology referral was placed. Foot exam results were within normal limits.

## 2023-10-11 ENCOUNTER — Encounter: Payer: Self-pay | Admitting: Family Medicine

## 2023-10-11 ENCOUNTER — Other Ambulatory Visit: Payer: Self-pay | Admitting: Family Medicine

## 2023-10-11 MED ORDER — METFORMIN HCL 500 MG PO TABS: 500.0000 mg | ORAL_TABLET | Freq: Every day | ORAL | 1 refills | Status: DC

## 2023-10-11 MED ORDER — LEVOTHYROXINE SODIUM 125 MCG PO TABS: 125.0000 ug | ORAL_TABLET | Freq: Every day | ORAL | 3 refills | Status: DC

## 2023-10-12 ENCOUNTER — Encounter: Payer: Self-pay | Admitting: Internal Medicine

## 2023-10-12 ENCOUNTER — Ambulatory Visit (INDEPENDENT_AMBULATORY_CARE_PROVIDER_SITE_OTHER): Payer: Medicare Other | Admitting: Internal Medicine

## 2023-10-12 VITALS — BP 138/86 | HR 53 | Ht 70.0 in | Wt 214.4 lb

## 2023-10-12 DIAGNOSIS — J0110 Acute frontal sinusitis, unspecified: Secondary | ICD-10-CM | POA: Diagnosis not present

## 2023-10-12 DIAGNOSIS — H6123 Impacted cerumen, bilateral: Secondary | ICD-10-CM

## 2023-10-12 LAB — CMP14+EGFR
ALT: 44 [IU]/L (ref 0–44)
AST: 26 [IU]/L (ref 0–40)
Albumin: 4.4 g/dL (ref 3.9–4.9)
Alkaline Phosphatase: 93 [IU]/L (ref 44–121)
BUN/Creatinine Ratio: 10 (ref 10–24)
BUN: 10 mg/dL (ref 8–27)
Bilirubin Total: 1.1 mg/dL (ref 0.0–1.2)
CO2: 25 mmol/L (ref 20–29)
Calcium: 9 mg/dL (ref 8.6–10.2)
Chloride: 102 mmol/L (ref 96–106)
Creatinine, Ser: 0.96 mg/dL (ref 0.76–1.27)
Globulin, Total: 2.2 g/dL (ref 1.5–4.5)
Glucose: 118 mg/dL — ABNORMAL HIGH (ref 70–99)
Potassium: 4 mmol/L (ref 3.5–5.2)
Sodium: 142 mmol/L (ref 134–144)
Total Protein: 6.6 g/dL (ref 6.0–8.5)
eGFR: 90 mL/min/{1.73_m2} (ref >59)

## 2023-10-12 LAB — HEPATITIS C ANTIBODY: Hep C Virus Ab: NONREACTIVE

## 2023-10-12 LAB — CBC WITH DIFFERENTIAL/PLATELET
Basophils Absolute: 0.1 10*3/uL (ref 0.0–0.2)
Basos: 1 %
EOS (ABSOLUTE): 0.1 10*3/uL (ref 0.0–0.4)
Eos: 1 %
Hematocrit: 48.6 % (ref 37.5–51.0)
Hemoglobin: 16 g/dL (ref 13.0–17.7)
Immature Grans (Abs): 0 10*3/uL (ref 0.0–0.1)
Immature Granulocytes: 0 %
Lymphocytes Absolute: 1.9 10*3/uL (ref 0.7–3.1)
Lymphs: 21 %
MCH: 30.9 pg (ref 26.6–33.0)
MCHC: 32.9 g/dL (ref 31.5–35.7)
MCV: 94 fL (ref 79–97)
Monocytes Absolute: 0.8 10*3/uL (ref 0.1–0.9)
Monocytes: 9 %
Neutrophils Absolute: 6.2 10*3/uL (ref 1.4–7.0)
Neutrophils: 68 %
Platelets: 187 10*3/uL (ref 150–450)
RBC: 5.18 x10E6/uL (ref 4.14–5.80)
RDW: 12.2 % (ref 11.6–15.4)
WBC: 9.1 10*3/uL (ref 3.4–10.8)

## 2023-10-12 LAB — TSH+FREE T4
Free T4: 1.74 ng/dL (ref 0.82–1.77)
TSH: 0.202 u[IU]/mL — ABNORMAL LOW (ref 0.450–4.500)

## 2023-10-12 LAB — LIPID PANEL
Chol/HDL Ratio: 3.1 {ratio} (ref 0.0–5.0)
Cholesterol, Total: 92 mg/dL — ABNORMAL LOW (ref 100–199)
HDL: 30 mg/dL — ABNORMAL LOW (ref >39)
LDL Chol Calc (NIH): 46 mg/dL (ref 0–99)
Triglycerides: 79 mg/dL (ref 0–149)
VLDL Cholesterol Cal: 16 mg/dL (ref 5–40)

## 2023-10-12 LAB — MICROALBUMIN / CREATININE URINE RATIO
Creatinine, Urine: 92.7 mg/dL
Microalb/Creat Ratio: 16 mg/g{creat} (ref 0–29)
Microalbumin, Urine: 14.4 ug/mL

## 2023-10-12 LAB — HEMOGLOBIN A1C
Est. average glucose Bld gHb Est-mCnc: 154 mg/dL
Hgb A1c MFr Bld: 7 % — ABNORMAL HIGH (ref 4.8–5.6)

## 2023-10-12 LAB — VITAMIN D 25 HYDROXY (VIT D DEFICIENCY, FRACTURES): Vit D, 25-Hydroxy: 45.9 ng/mL (ref 30.0–100.0)

## 2023-10-12 MED ORDER — AMOXICILLIN-POT CLAVULANATE 875-125 MG PO TABS: 1.0000 | ORAL_TABLET | Freq: Two times a day (BID) | ORAL | 0 refills | Status: DC

## 2023-10-12 MED ORDER — FLUTICASONE PROPIONATE 50 MCG/ACT NA SUSP: 2.0000 | Freq: Every day | NASAL | 1 refills | Status: DC

## 2023-10-12 NOTE — Progress Notes (Signed)
Acute Office Visit  Subjective:    Patient ID: William Mcmahon, male    DOB: [birth year], 62 y.o.   MRN: [id number]  Chief Complaint  Patient presents with   Ear Pain    Pt reports sx of ear pain and sore throat. Feels like ear is draining in his throat.     HPI Patient is in today for complaint of nasal congestion, postnasal drip, sore throat and right ear pain for the last 3 days.  He has felt right ear fullness and feels like his ear is draining into his throat.  Denies any ear discharge currently.  He also reports cough with clear expectoration.  Denies fever or chills.  He has fatigue.  Denies taking any OTC medicine as he was afraid considering his cardiac conditions (CAD, A-fib and HTN).  Past Medical History:  Diagnosis Date   CAD in native artery, with prior stents, and instent restenosis of small vessel, medical therapy, LAD and RCA stents are patent 09/12/2018   Cancer (HCC)    thyroid   DM (diabetes mellitus), type 2 (HCC) diet controlled 09/12/2018   HLD (hyperlipidemia) 09/12/2018   Hyperlipidemia    Hypertension    PAF (paroxysmal atrial fibrillation) (HCC)    Syncope- may be due to diuretics 09/12/2018   Thyroid disease     Past Surgical History:  Procedure Laterality Date   CARDIAC SURGERY     stents   CHOLECYSTECTOMY     LEFT HEART CATH AND CORONARY ANGIOGRAPHY N/A 09/11/2018   Procedure: LEFT HEART CATH AND CORONARY ANGIOGRAPHY;  Surgeon: Marykay Lex, MD;  Location: [facility] INVASIVE CV LAB;  Service: Cardiovascular;  Laterality: N/A;    Family History  Problem Relation Age of Onset   Diabetes Mother    CAD Father        s/p stent and CABG   Heart attack Maternal Grandfather     Social History   Socioeconomic History   Marital status: Married    Spouse name: William Mcmahon   Number of children: Not on file   Years of education: Not on file   Highest education level: Associate degree: occupational, technical, or vocational program  Occupational History    Not on file  Tobacco Use   Smoking status: Former   Smokeless tobacco: Never  Vaping Use   Vaping status: Never Used  Substance and Sexual Activity   Alcohol use: Never   Drug use: Yes    Types: Marijuana   Sexual activity: Yes  Other Topics Concern   Not on file  Social History Narrative   Not on file   Social Drivers of Health   Financial Resource Strain: Low Risk  (09/29/2023)   Overall Financial Resource Strain (CARDIA)    Difficulty of Paying Living Expenses: Not hard at all  Food Insecurity: No Food Insecurity (09/29/2023)   Hunger Vital Sign    Worried About Running Out of Food in the Last Year: Never true    Ran Out of Food in the Last Year: Never true  Transportation Needs: No Transportation Needs (09/29/2023)   PRAPARE - Administrator, Civil Service (Medical): No    Lack of Transportation (Non-Medical): No  Physical Activity: Sufficiently Active (09/29/2023)   Exercise Vital Sign    Days of Exercise per Week: 5 days    Minutes of Exercise per Session: 60 min  Stress: Stress Concern Present (09/29/2023)   [employer] of Occupational Health - Occupational Stress Questionnaire  Feeling of Stress : To some extent  Social Connections: Socially Isolated (09/29/2023)   Social Connection and Isolation Panel [NHANES]    Frequency of Communication with Friends and Family: More than three times a week    Frequency of Social Gatherings with Friends and Family: Twice a week    Attends Religious Services: Never    Database administrator or Organizations: No    Attends Engineer, structural: Not on file    Marital Status: Separated  Intimate Partner Violence: Not At Risk (03/23/2023)   Received from [facility]   HITS    Over the last 12 months how often did your partner physically hurt you?: Never    Over the last 12 months how often did your partner insult you or talk down to you?: Sometimes    Over the last 12 months how often did your partner  threaten you with physical harm?: Never    Over the last 12 months how often did your partner scream or curse at you?: Sometimes    Outpatient Medications Prior to Visit  Medication Sig Dispense Refill   amLODipine (NORVASC) 5 MG tablet Take 1 tablet by mouth once daily 90 tablet 3   apixaban (ELIQUIS) 5 MG TABS tablet Take 1 tablet (5 mg total) by mouth 2 (two) times daily. 180 tablet 1   atorvastatin (LIPITOR) 80 MG tablet Take 1 tablet by mouth once daily 90 tablet 3   carvedilol (COREG) 12.5 MG tablet Take 1 tablet (12.5 mg total) by mouth 2 (two) times daily with a meal. 180 tablet 3   clopidogrel (PLAVIX) 75 MG tablet Take 1 tablet by mouth once daily 90 tablet 3   colestipol (COLESTID) 1 g tablet Take 1 g by mouth 2 (two) times daily.     dicyclomine (BENTYL) 20 MG tablet Take 20 mg by mouth 3 (three) times daily before meals. Takes two hours before or five hours after other medications     ezetimibe (ZETIA) 10 MG tablet Take 1 tablet by mouth once daily 90 tablet 1   isosorbide mononitrate (IMDUR) 60 MG 24 hr tablet Take 1 tablet by mouth once daily 90 tablet 2   levothyroxine (SYNTHROID) 125 MCG tablet Take 1 tablet (125 mcg total) by mouth daily before breakfast. 30 tablet 3   lipase/protease/amylase (CREON) 36000 UNITS CPEP capsule Take 72,000 Units by mouth 3 (three) times daily with meals.     magnesium (MAGTAB) 84 MG ( ) TBCR SR tablet Take 84 mg by mouth daily.     metFORMIN (GLUCOPHAGE) 500 MG tablet Take 1 tablet (500 mg total) by mouth daily with breakfast. 90 tablet 1   nitroGLYCERIN (NITROSTAT) 0.4 MG SL tablet Place 1 tablet (0.4 mg total) under the tongue every 5 (five) minutes as needed for chest pain. 25 tablet 2   potassium chloride (KLOR-CON) 10 MEQ tablet TAKE 2 TABLETS BY MOUTH ON MONDAY, WEDNESDAY AND FRIDAY, THEN 1 TABLET ONCE DAILY ON TUESDAY, THURSDAY, SATURDAY AND SUNDAY 135 tablet 0   No facility-administered medications prior to visit.    Allergies   Allergen Reactions   Naproxen Swelling    Review of Systems  Constitutional:  Positive for fatigue. Negative for chills and fever.  HENT:  Positive for congestion, ear pain, postnasal drip, sinus pressure, sinus pain and sore throat.   Eyes:  Negative for pain and discharge.  Respiratory:  Positive for cough. Negative for shortness of breath.   Cardiovascular:  Negative for chest pain  and palpitations.  Gastrointestinal:  Negative for diarrhea, nausea and vomiting.  Endocrine: Negative for polydipsia and polyuria.  Genitourinary:  Negative for dysuria and hematuria.  Musculoskeletal:  Negative for neck pain and neck stiffness.  Skin:  Negative for rash.  Neurological:  Negative for dizziness and weakness.  Psychiatric/Behavioral:  Negative for agitation and behavioral problems.        Objective:    Physical Exam Vitals reviewed.  Constitutional:      General: He is not in acute distress.    Appearance: He is not diaphoretic.  HENT:     Head: Normocephalic and atraumatic.     Right Ear: There is impacted cerumen.     Left Ear: There is impacted cerumen.     Nose: Congestion present.     Right Sinus: Frontal sinus tenderness present.     Left Sinus: Frontal sinus tenderness present.     Mouth/Throat:     Mouth: Mucous membranes are moist.     Pharynx: Posterior oropharyngeal erythema present.  Eyes:     General: No scleral icterus.    Extraocular Movements: Extraocular movements intact.  Cardiovascular:     Rate and Rhythm: Normal rate and regular rhythm.     Heart sounds: Normal heart sounds. No murmur heard. Pulmonary:     Breath sounds: Normal breath sounds. No wheezing or rales.  Musculoskeletal:     Right lower leg: No edema.     Left lower leg: No edema.  Skin:    General: Skin is warm.     Findings: No rash.  Neurological:     General: No focal deficit present.     Mental Status: He is alert and oriented to person, place, and time.  Psychiatric:         Mood and Affect: Mood normal.        Behavior: Behavior normal.     BP 138/86 (BP Location: Left Arm)   Pulse (!) 53   Ht 5\' 10"  (1.778 m)   Wt 214 lb 6.4 oz (97.3 kg)   SpO2 97%   BMI 30.76 kg/m  Wt Readings from Last 3 Encounters:  10/12/23 214 lb 6.4 oz (97.3 kg)  10/06/23 214 lb 0.6 oz (97.1 kg)  06/23/23 214 lb (97.1 kg)        Assessment & Plan:   Problem List Items Addressed This Visit       Respiratory   Acute non-recurrent frontal sinusitis - Primary   Check flu, COVID and RSV tests Considering his chronic medical conditions, started empiric Augmentin Flonase for nasal congestion/allergies Advised to use vaporizer for nasal congestion Maintain adequate hydration      Relevant Medications   amoxicillin-clavulanate (AUGMENTIN) 875-125 MG tablet   fluticasone (FLONASE) 50 MCG/ACT nasal spray   Other Relevant Orders   COVID-19, Flu A+B and RSV     Nervous and Auditory   Bilateral impacted cerumen   Excess earwax bilaterally Can use Debrox eardrop and washcloth If persistent, will pursue ear irrigation        Meds ordered this encounter  Medications   amoxicillin-clavulanate (AUGMENTIN) 875-125 MG tablet    Sig: Take 1 tablet by mouth 2 (two) times daily.    Dispense:  14 tablet    Refill:  0   fluticasone (FLONASE) 50 MCG/ACT nasal spray    Sig: Place 2 sprays into both nostrils daily.    Dispense:  16 g    Refill:  1     Vannah Nadal  Concha Se, MD

## 2023-10-12 NOTE — Assessment & Plan Note (Signed)
Check flu, COVID and RSV tests Considering his chronic medical conditions, started empiric Augmentin Flonase for nasal congestion/allergies Advised to use vaporizer for nasal congestion Maintain adequate hydration

## 2023-10-12 NOTE — Assessment & Plan Note (Signed)
Excess earwax bilaterally Can use Debrox eardrop and washcloth If persistent, will pursue ear irrigation

## 2023-10-12 NOTE — Patient Instructions (Signed)
Please start taking Augmentin as prescribed.  Please use Flonase for nasal congestion/allergies.

## 2023-10-12 NOTE — Telephone Encounter (Signed)
Pt. Responded that he understood your results and plan of care.

## 2023-10-14 ENCOUNTER — Encounter: Payer: Self-pay | Admitting: Internal Medicine

## 2023-10-14 LAB — COVID-19, FLU A+B AND RSV
Influenza A, NAA: NOT DETECTED
Influenza B, NAA: NOT DETECTED
RSV, NAA: NOT DETECTED
SARS-CoV-2, NAA: NOT DETECTED

## 2023-10-21 ENCOUNTER — Ambulatory Visit: Payer: Medicare Other | Admitting: Family Medicine
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
## 2023-11-01 ENCOUNTER — Ambulatory Visit: Payer: Medicare Other | Attending: Cardiology | Admitting: Cardiology

## 2023-11-01 ENCOUNTER — Encounter: Payer: Self-pay | Admitting: Cardiology

## 2023-11-01 VITALS — BP 140/64 | HR 45 | Ht 70.0 in | Wt 204.4 lb

## 2023-11-01 DIAGNOSIS — I2510 Atherosclerotic heart disease of native coronary artery without angina pectoris: Secondary | ICD-10-CM | POA: Diagnosis not present

## 2023-11-01 DIAGNOSIS — I10 Essential (primary) hypertension: Secondary | ICD-10-CM

## 2023-11-01 DIAGNOSIS — I4891 Unspecified atrial fibrillation: Secondary | ICD-10-CM

## 2023-11-01 DIAGNOSIS — I480 Paroxysmal atrial fibrillation: Secondary | ICD-10-CM | POA: Diagnosis not present

## 2023-11-01 DIAGNOSIS — E785 Hyperlipidemia, unspecified: Secondary | ICD-10-CM

## 2023-11-01 MED ORDER — EZETIMIBE 10 MG PO TABS
10.0000 mg | ORAL_TABLET | Freq: Every day | ORAL | 3 refills | Status: AC
Start: 2023-11-01 — End: ?

## 2023-11-01 MED ORDER — AMLODIPINE BESYLATE 5 MG PO TABS: 7.5000 mg | ORAL_TABLET | Freq: Every day | ORAL | 3 refills | Status: DC

## 2023-11-01 MED ORDER — CLOPIDOGREL BISULFATE 75 MG PO TABS: 75.0000 mg | ORAL_TABLET | Freq: Every day | ORAL | 3 refills | Status: AC

## 2023-11-01 MED ORDER — CARVEDILOL 12.5 MG PO TABS: 12.5000 mg | ORAL_TABLET | Freq: Two times a day (BID) | ORAL | 3 refills | Status: AC

## 2023-11-01 MED ORDER — ATORVASTATIN CALCIUM 80 MG PO TABS: 80.0000 mg | ORAL_TABLET | Freq: Every day | ORAL | 3 refills | Status: AC

## 2023-11-01 MED ORDER — ISOSORBIDE MONONITRATE ER 60 MG PO TB24: 60.0000 mg | ORAL_TABLET | Freq: Every day | ORAL | 3 refills | Status: AC

## 2023-11-01 MED ORDER — APIXABAN 5 MG PO TABS: 5.0000 mg | ORAL_TABLET | Freq: Two times a day (BID) | ORAL | 3 refills | Status: AC

## 2023-11-01 MED ORDER — POTASSIUM CHLORIDE ER 10 MEQ PO TBCR: EXTENDED_RELEASE_TABLET | ORAL | 3 refills | Status: DC

## 2023-11-01 NOTE — Patient Instructions (Signed)
 Medication Instructions:  Increase Amlodipine to 7.5 mg by mouth daily. Scripts sent. *If you need a refill on your cardiac medications before your next appointment, please call your pharmacy*   Follow-Up: At [facility], you and your health needs are our priority.  As part of our continuing mission to provide you with exceptional heart care, we have created designated Provider Care Teams.  These Care Teams include your primary Cardiologist (physician) and Advanced Practice Providers (APPs -  Physician Assistants and Nurse Practitioners) who all work together to provide you with the care you need, when you need it.  We recommend signing up for the patient portal called "MyChart".  Sign up information is provided on this After Visit Summary.  MyChart is used to connect with patients for Virtual Visits (Telemedicine).  Patients are able to view lab/test results, encounter notes, upcoming appointments, etc.  Non-urgent messages can be sent to your provider as well.   To learn more about what you can do with MyChart, go to [email or web address].    Your next appointment:   1 year(s)  Provider:   Rollene Rotunda, MD     Other Instructions

## 2023-11-02 ENCOUNTER — Ambulatory Visit: Admitting: Internal Medicine

## 2023-11-03 ENCOUNTER — Ambulatory Visit
Admission: RE | Admit: 2023-11-03 | Discharge: 2023-11-03 | Disposition: A | Discharge status: Home / Self Care | Source: Ambulatory Visit | Attending: Nurse Practitioner | Admitting: Nurse Practitioner

## 2023-11-03 VITALS — BP 135/87 | HR 54 | Temp 98.2°F | Resp 18

## 2023-11-03 DIAGNOSIS — Z87442 Personal history of urinary calculi: Secondary | ICD-10-CM | POA: Diagnosis present

## 2023-11-03 DIAGNOSIS — R319 Hematuria, unspecified: Secondary | ICD-10-CM | POA: Insufficient documentation

## 2023-11-03 LAB — POCT URINALYSIS DIP (MANUAL ENTRY)
Bilirubin, UA: NEGATIVE
Glucose, UA: NEGATIVE mg/dL
Ketones, POC UA: NEGATIVE mg/dL
Nitrite, UA: NEGATIVE
Protein Ur, POC: NEGATIVE mg/dL
Spec Grav, UA: <=1.005 — AB (ref 1.010–1.025)
Urobilinogen, UA: 0.2 U/dL
pH, UA: 6 (ref 5.0–8.0)

## 2023-11-03 MED ORDER — TAMSULOSIN HCL 0.4 MG PO CAPS: 0.4000 mg | ORAL_CAPSULE | Freq: Every day | ORAL | 0 refills | Status: DC

## 2023-11-03 MED ORDER — NITROFURANTOIN MONOHYD MACRO 100 MG PO CAPS: 100.0000 mg | ORAL_CAPSULE | Freq: Two times a day (BID) | ORAL | 0 refills | Status: DC

## 2023-11-03 NOTE — ED Provider Notes (Signed)
 [facility]    CSN: [id number] Arrival date & time: 11/03/23  1731      History   Chief Complaint Chief Complaint  Patient presents with   URI    Bloody urine ? - Entered by patient    HPI Sabastien Mcmahon is a 63 y.o. male.   The history is provided by the patient.   Patient presents for complaints of pain with urination, hematuria, and low back pain for the past 4 days.  Patient states he believes he passed a kidney stone prior to his symptoms starting.  He states prior to the kidney stone, he had significant blood in his urine, and pain with urination along with flank pain.  Patient is currently on Eliquis.  States that his last kidney stone was several years ago.  Denies fever, chills, urinary frequency, urgency, hesitancy, flank pain, or decreased urine stream. Past Medical History:  Diagnosis Date   CAD in native artery, with prior stents, and instent restenosis of small vessel, medical therapy, LAD and RCA stents are patent 09/12/2018   Cancer (HCC)    thyroid   DM (diabetes mellitus), type 2 (HCC) diet controlled 09/12/2018   HLD (hyperlipidemia) 09/12/2018   Hyperlipidemia    Hypertension    PAF (paroxysmal atrial fibrillation) (HCC)    Syncope- may be due to diuretics 09/12/2018   Thyroid disease     Patient Active Problem List   Diagnosis Date Noted   Acute non-recurrent frontal sinusitis 10/12/2023   Bilateral impacted cerumen 10/12/2023   Renal cyst 05/05/2023   Renal stones 05/05/2023   Type 2 diabetes mellitus without complication, without long-term current use of insulin (HCC) 05/05/2023   Idiopathic chronic pancreatitis (HCC) 02/01/2023   Hypomagnesemia 01/25/2023   Jaw pain 01/25/2023   Generalized weakness 01/23/2023   AKI (acute kidney injury) (HCC) 01/23/2023   Acute diarrhea 01/23/2023   Dehydration 01/23/2023   Other fatigue 01/23/2023   Dyspnea 01/23/2023   Leukocytosis 03/31/2021   Hypophosphatemia 03/31/2021   Hypothyroidism  03/31/2021   Hyperglycemia 03/31/2021   Obesity (BMI 30.0-34.9) 03/31/2021   Atypical chest pain 03/30/2021   PAF (paroxysmal atrial fibrillation) (HCC) 12/01/2020   Dysphagia 12/01/2020   Demand ischemia (HCC) 09/22/2020   Atrial fibrillation with RVR (HCC) 09/21/2020   History of thyroid cancer 03/31/2020   Educated about COVID-19 virus infection 02/07/2019   Hypercholesteremia 01/15/2019   Essential hypertension 11/29/2018   Palpitations 11/29/2018   SOB (shortness of breath) 11/29/2018   CAD in native artery, with prior stents, and instent restenosis of small vessel, medical therapy, LAD and RCA stents are patent 09/12/2018   HLD (hyperlipidemia) 09/12/2018   DM (diabetes mellitus), type 2 (HCC) diet controlled 09/12/2018   Hypokalemia 09/12/2018   Syncope- may be due to diuretics 09/12/2018   Unstable angina (HCC) 09/10/2018   Bilateral numbness and tingling of arms and legs 10/21/2017   Unintentional weight loss 10/21/2017   Postoperative hypothyroidism 06/27/2017   Transaminitis 06/27/2017   Coronary artery disease 06/27/2017    Past Surgical History:  Procedure Laterality Date   CARDIAC SURGERY     stents   CHOLECYSTECTOMY     LEFT HEART CATH AND CORONARY ANGIOGRAPHY N/A 09/11/2018   Procedure: LEFT HEART CATH AND CORONARY ANGIOGRAPHY;  Surgeon: Marykay Lex, MD;  Location: [facility] INVASIVE CV LAB;  Service: Cardiovascular;  Laterality: N/A;       Home Medications    Prior to Admission medications   Medication Sig Start Date End Date  Taking? Authorizing Provider  nitrofurantoin, macrocrystal-monohydrate, (MACROBID) 100 MG capsule Take 1 capsule (100 mg total) by mouth 2 (two) times daily. 11/03/23  Yes Leath-Warren, Sadie Haber, NP  tamsulosin (FLOMAX) 0.4 MG CAPS capsule Take 1 capsule (0.4 mg total) by mouth daily after supper. 11/03/23  Yes Leath-Warren, Sadie Haber, NP  amLODipine (NORVASC) 5 MG tablet Take 1.5 tablets (7.5 mg total) by mouth daily. 11/01/23   Rollene Rotunda, MD  amoxicillin-clavulanate (AUGMENTIN) 875-125 MG tablet Take 1 tablet by mouth 2 (two) times daily. Patient not taking: Reported on 11/01/2023 10/12/23   Anabel Halon, MD  apixaban (ELIQUIS) 5 MG TABS tablet Take 1 tablet (5 mg total) by mouth 2 (two) times daily. 11/01/23   Rollene Rotunda, MD  atorvastatin (LIPITOR) 80 MG tablet Take 1 tablet (80 mg total) by mouth daily. 11/01/23   Rollene Rotunda, MD  carvedilol (COREG) 12.5 MG tablet Take 1 tablet (12.5 mg total) by mouth 2 (two) times daily with a meal. 11/01/23   Rollene Rotunda, MD  clopidogrel (PLAVIX) 75 MG tablet Take 1 tablet (75 mg total) by mouth daily. 11/01/23   Rollene Rotunda, MD  colestipol (COLESTID) 1 g tablet Take 1 g by mouth 2 (two) times daily. 10/21/22   [provider]  dicyclomine (BENTYL) 20 MG tablet Take 20 mg by mouth 3 (three) times daily before meals. Takes two hours before or five hours after other medications    [provider]  ezetimibe (ZETIA) 10 MG tablet Take 1 tablet (10 mg total) by mouth daily. 11/01/23   Rollene Rotunda, MD  fluticasone (FLONASE) 50 MCG/ACT nasal spray Place 2 sprays into both nostrils daily. Patient not taking: Reported on 11/01/2023 10/12/23   Anabel Halon, MD  isosorbide mononitrate (IMDUR) 60 MG 24 hr tablet Take 1 tablet (60 mg total) by mouth daily. 11/01/23   Rollene Rotunda, MD  levothyroxine (SYNTHROID) 125 MCG tablet Take 1 tablet (125 mcg total) by mouth daily before breakfast. 10/11/23   Del Newman Nip, Tenna Child, FNP  lipase/protease/amylase (CREON) 36000 UNITS CPEP capsule Take 72,000 Units by mouth 3 (three) times daily with meals.    [provider]  magnesium (MAGTAB) 84 MG ( ) TBCR SR tablet Take 84 mg by mouth daily.    [provider]  metFORMIN (GLUCOPHAGE) 500 MG tablet Take 1 tablet (500 mg total) by mouth daily with breakfast. 10/11/23   Del Newman Nip, Tenna Child, FNP  nitroGLYCERIN (NITROSTAT) 0.4 MG SL tablet Place 1 tablet (0.4 mg  total) under the tongue every 5 (five) minutes as needed for chest pain. 10/28/22   Rollene Rotunda, MD  potassium chloride (KLOR-CON) 10 MEQ tablet TAKE 2 TABLETS BY MOUTH ON MONDAY, WEDNESDAY AND FRIDAY, THEN 1 TABLET ONCE DAILY ON TUESDAY, THURSDAY, SATURDAY AND SUNDAY 11/01/23   Rollene Rotunda, MD    Family History Family History  Problem Relation Age of Onset   Diabetes Mother    CAD Father        s/p stent and CABG   Heart attack Maternal Grandfather     Social History Social History   Tobacco Use   Smoking status: Former   Smokeless tobacco: Never  Vaping Use   Vaping status: Never Used  Substance Use Topics   Alcohol use: Never   Drug use: Yes    Types: Marijuana     Allergies   Naproxen   Review of Systems Review of Systems Per HPI  Physical Exam Triage Vital Signs ED Triage  Vitals  Encounter Vitals Group     BP 11/03/23 1800 135/87     Systolic BP Percentile --      Diastolic BP Percentile --      Pulse Rate 11/03/23 1800 (!) 54     Resp 11/03/23 1800 18     Temp 11/03/23 1800 98.2 F (36.8 C)     Temp Source 11/03/23 1800 Oral     SpO2 11/03/23 1800 97 %     Weight --      Height --      Head Circumference --      Peak Flow --      Pain Score 11/03/23 1801 5     Pain Loc --      Pain Education --      Exclude from Growth Chart --    No data found.  Updated Vital Signs BP 135/87 (BP Location: Right Arm)   Pulse (!) 54   Temp 98.2 F (36.8 C) (Oral)   Resp 18   SpO2 97%   Visual Acuity Right Eye Distance:   Left Eye Distance:   Bilateral Distance:    Right Eye Near:   Left Eye Near:    Bilateral Near:     Physical Exam Vitals and nursing note reviewed.  Constitutional:      General: He is not in acute distress.    Appearance: Normal appearance.  HENT:     Head: Normocephalic.  Eyes:     Extraocular Movements: Extraocular movements intact.     Conjunctiva/sclera: Conjunctivae normal.     Pupils: Pupils are equal, round, and  reactive to light.  Cardiovascular:     Rate and Rhythm: Normal rate and regular rhythm.     Pulses: Normal pulses.     Heart sounds: Normal heart sounds.  Pulmonary:     Effort: Pulmonary effort is normal.     Breath sounds: Normal breath sounds.  Abdominal:     General: Bowel sounds are normal.     Palpations: Abdomen is soft.     Tenderness: There is no abdominal tenderness. There is no right CVA tenderness or left CVA tenderness.  Musculoskeletal:     Cervical back: Normal range of motion.  Lymphadenopathy:     Cervical: No cervical adenopathy.  Skin:    General: Skin is warm and dry.  Neurological:     General: No focal deficit present.     Mental Status: He is alert and oriented to person, place, and time.  Psychiatric:        Mood and Affect: Mood normal.        Behavior: Behavior normal.      UC Treatments / Results  Labs (all labs ordered are listed, but only abnormal results are displayed) Labs Reviewed  POCT URINALYSIS DIP (MANUAL ENTRY) - Abnormal; Notable for the following components:      Result Value   Color, UA light yellow (*)    Spec Grav, UA <=1.005 (*)    Blood, UA large (*)    Leukocytes, UA Trace (*)    All other components within normal limits  URINE CULTURE    EKG   Radiology No results found.  Procedures Procedures (including critical care time)  Medications Ordered in UC Medications - No data to display  Initial Impression / Assessment and Plan / UC Course  I have reviewed the triage vital signs and the nursing notes.  Pertinent labs & imaging results that were available during  my care of the patient were reviewed by me and considered in my medical decision making (see chart for details).  Urinalysis does not indicate an obvious UTI, although patient does have continued large blood and trace leukocytes.  Urine culture is pending.  Cannot rule out renal stone versus UTI.  Will treat patient empirically with Macrobid 100 mg twice  daily for the next 5 days for UTI, and tamsulosin 0.4 mg for possible kidney stone and to help with urinary flow.  Supportive care recommendations were provided and discussed with the patient to include fluids, rest, continued fluid intake, and over-the-counter analgesics.  Patient was advised to follow-up with urology for reevaluation.  Patient was in agreement with this plan of care and verbalizes understanding.  All questions were answered.  Patient stable for discharge.  Final Clinical Impressions(s) / UC Diagnoses   Final diagnoses:  Hematuria, unspecified type  History of kidney stones     Discharge Instructions      A urine culture has been ordered.  You will be contacted if the results of the culture are abnormal, or if the culture is negative.  You will also have access to your results via MyChart. Take medication as prescribed. Increase fluids and allow for plenty of rest. Be sure you are drinking at least 8-10 8 ounce glasses of water daily. May take over-the-counter Tylenol as needed for pain, fever, or general discomfort. Strain each urine to ensure you have passed all kidney stones. I would like for you to follow-up with your urologist as scheduled.  Follow-up sooner if you experience increased blood in your urine or other concerns. Follow-up as needed.       ED Prescriptions     Medication Sig Dispense Auth. Provider   tamsulosin (FLOMAX) 0.4 MG CAPS capsule Take 1 capsule (0.4 mg total) by mouth daily after supper. 30 capsule Leath-Warren, Sadie Haber, NP   nitrofurantoin, macrocrystal-monohydrate, (MACROBID) 100 MG capsule Take 1 capsule (100 mg total) by mouth 2 (two) times daily. 10 capsule Leath-Warren, Sadie Haber, NP      PDMP not reviewed this encounter.   Abran Cantor, NP 11/03/23 1826

## 2023-11-03 NOTE — Discharge Instructions (Addendum)
 A urine culture has been ordered.  You will be contacted if the results of the culture are abnormal, or if the culture is negative.  You will also have access to your results via MyChart. Take medication as prescribed. Increase fluids and allow for plenty of rest. Be sure you are drinking at least 8-10 8 ounce glasses of water daily. May take over-the-counter Tylenol as needed for pain, fever, or general discomfort. Strain each urine to ensure you have passed all kidney stones. I would like for you to follow-up with your urologist as scheduled.  Follow-up sooner if you experience increased blood in your urine or other concerns. Follow-up as needed.

## 2023-11-03 NOTE — ED Triage Notes (Signed)
 Pt reports he has some burning with urination, blood in urine ,and low back pian x 4 days

## 2023-11-05 LAB — URINE CULTURE: Culture: <10000 — AB

## 2023-11-09 ENCOUNTER — Ambulatory Visit: Payer: Medicare Other | Admitting: Family Medicine

## 2023-11-24 ENCOUNTER — Ambulatory Visit (HOSPITAL_COMMUNITY)
Admission: RE | Admit: 2023-11-24 | Discharge: 2023-11-24 | Disposition: A | Discharge status: Home / Self Care | Source: Ambulatory Visit | Attending: Urology | Admitting: Urology

## 2023-11-24 ENCOUNTER — Encounter: Payer: Self-pay | Admitting: Urology

## 2023-11-24 ENCOUNTER — Ambulatory Visit: Payer: Medicare Other | Admitting: Urology

## 2023-11-24 VITALS — BP 149/74 | HR 51

## 2023-11-24 DIAGNOSIS — N281 Cyst of kidney, acquired: Secondary | ICD-10-CM | POA: Diagnosis not present

## 2023-11-24 DIAGNOSIS — R310 Gross hematuria: Secondary | ICD-10-CM | POA: Diagnosis not present

## 2023-11-24 DIAGNOSIS — N200 Calculus of kidney: Secondary | ICD-10-CM | POA: Insufficient documentation

## 2023-11-24 DIAGNOSIS — R3129 Other microscopic hematuria: Secondary | ICD-10-CM

## 2023-11-24 LAB — URINALYSIS, ROUTINE W REFLEX MICROSCOPIC
Bilirubin, UA: NEGATIVE
Glucose, UA: NEGATIVE
Nitrite, UA: NEGATIVE
Specific Gravity, UA: 1.01 (ref 1.005–1.030)
Urobilinogen, Ur: 1 mg/dL (ref 0.2–1.0)
pH, UA: 6 (ref 5.0–7.5)

## 2023-11-24 LAB — MICROSCOPIC EXAMINATION: RBC, Urine: >30 /HPF — AB (ref 0–2)

## 2023-11-24 MED ORDER — DIAZEPAM 10 MG PO TABS: ORAL_TABLET | ORAL | 0 refills | Status: DC

## 2023-11-24 NOTE — Progress Notes (Unsigned)
 Subjective: 1. Microhematuria   2. Renal stones   3. Renal cyst      11/24/23: William Mcmahon returns today in f/u.  He was in the ER on 11/03/23 with a possible stone but didn't have imaging.  UA had large blood.  Urine culture was negative. He was given flomax.  He had dark urine and right flank pain but had no imaging He had a CT hematuria study on 09/21/23 that showed bilateral small renal stones and a large right renal cyst.   06/23/23: William Mcmahon is a 63 yo male who has a one month history of right flank pain that is variably severe and can keep him up at night.  He has had no hematuria.  He has had no nausea.  He had a CT AP without contrast in 5/24 that shows a 9cm RLP cyst and small non-obstructing right renal stones.   He had a CT and [facility] in 2020 and the cyst was not mentioned but it was 8.1cm on a CT with [facility] in 12/23.Marland Kitchen  He feels a bulge on the right.  He has chronic nocturia x 4 and some urgency. HIs IPSS is 7.  He has had prior stones and he had ESWL here several years ago.  His last Cr was 1.09 in July.  He was hospitalized in July for e. Coli and then again for afib.   He is on Eliquis and Plavix with a history of afib and CAD with prior stents.  He is a former smoker. His PSA was 0.5 in 2022.      ROS:  Review of Systems  All other systems reviewed and are negative.   Allergies  Allergen Reactions   Naproxen Swelling    Past Medical History:  Diagnosis Date   CAD in native artery, with prior stents, and instent restenosis of small vessel, medical therapy, LAD and RCA stents are patent 09/12/2018   Cancer (HCC)    thyroid   DM (diabetes mellitus), type 2 (HCC) diet controlled 09/12/2018   HLD (hyperlipidemia) 09/12/2018   Hyperlipidemia    Hypertension    PAF (paroxysmal atrial fibrillation) (HCC)    Syncope- may be due to diuretics 09/12/2018   Thyroid disease     Past Surgical History:  Procedure Laterality Date   CARDIAC SURGERY     stents   CHOLECYSTECTOMY     LEFT  HEART CATH AND CORONARY ANGIOGRAPHY N/A 09/11/2018   Procedure: LEFT HEART CATH AND CORONARY ANGIOGRAPHY;  Surgeon: Marykay Lex, MD;  Location: [facility] INVASIVE CV LAB;  Service: Cardiovascular;  Laterality: N/A;    Social History   Socioeconomic History   Marital status: Married    Spouse name: Elsie Sakuma   Number of children: Not on file   Years of education: Not on file   Highest education level: Associate degree: occupational, technical, or vocational program  Occupational History   Not on file  Tobacco Use   Smoking status: Former   Smokeless tobacco: Never  Vaping Use   Vaping status: Never Used  Substance and Sexual Activity   Alcohol use: Never   Drug use: Yes    Types: Marijuana   Sexual activity: Not Currently    Partners: Female  Other Topics Concern   Not on file  Social History Narrative   Not on file   Social Drivers of Health   Financial Resource Strain: Low Risk  (09/29/2023)   Overall Financial Resource Strain (CARDIA)    Difficulty of Paying Living  Expenses: Not hard at all  Food Insecurity: No Food Insecurity (09/29/2023)   Hunger Vital Sign    Worried About Running Out of Food in the Last Year: Never true    Ran Out of Food in the Last Year: Never true  Transportation Needs: No Transportation Needs (09/29/2023)   PRAPARE - Administrator, Civil Service (Medical): No    Lack of Transportation (Non-Medical): No  Physical Activity: Sufficiently Active (09/29/2023)   Exercise Vital Sign    Days of Exercise per Week: 5 days    Minutes of Exercise per Session: 60 min  Stress: Stress Concern Present (09/29/2023)   [employer] of Occupational Health - Occupational Stress Questionnaire    Feeling of Stress : To some extent  Social Connections: Socially Isolated (09/29/2023)   Social Connection and Isolation Panel [NHANES]    Frequency of Communication with Friends and Family: More than three times a week    Frequency of Social Gatherings with  Friends and Family: Twice a week    Attends Religious Services: Never    Database administrator or Organizations: No    Attends Engineer, structural: Not on file    Marital Status: Separated  Intimate Partner Violence: Not At Risk (03/23/2023)   Received from [facility]   HITS    Over the last 12 months how often did your partner physically hurt you?: Never    Over the last 12 months how often did your partner insult you or talk down to you?: Sometimes    Over the last 12 months how often did your partner threaten you with physical harm?: Never    Over the last 12 months how often did your partner scream or curse at you?: Sometimes    Family History  Problem Relation Age of Onset   Diabetes Mother    CAD Father        s/p stent and CABG   Heart attack Maternal Grandfather     Anti-infectives: Anti-infectives (From admission, onward)    None       Current Outpatient Medications  Medication Sig Dispense Refill   amLODipine (NORVASC) 5 MG tablet Take 1.5 tablets (7.5 mg total) by mouth daily. 90 tablet 3   apixaban (ELIQUIS) 5 MG TABS tablet Take 1 tablet (5 mg total) by mouth 2 (two) times daily. 180 tablet 3   atorvastatin (LIPITOR) 80 MG tablet Take 1 tablet (80 mg total) by mouth daily. 90 tablet 3   carvedilol (COREG) 12.5 MG tablet Take 1 tablet (12.5 mg total) by mouth 2 (two) times daily with a meal. 180 tablet 3   clopidogrel (PLAVIX) 75 MG tablet Take 1 tablet (75 mg total) by mouth daily. 90 tablet 3   colestipol (COLESTID) 1 g tablet Take 1 g by mouth 2 (two) times daily.     diazepam (VALIUM) 10 MG tablet 1-2 tabs po 1 hour prior to procedure. 2 tablet 0   dicyclomine (BENTYL) 20 MG tablet Take 20 mg by mouth 3 (three) times daily before meals. Takes two hours before or five hours after other medications     ezetimibe (ZETIA) 10 MG tablet Take 1 tablet (10 mg total) by mouth daily. 90 tablet 3   isosorbide mononitrate (IMDUR) 60 MG 24 hr tablet Take 1  tablet (60 mg total) by mouth daily. 90 tablet 3   levothyroxine (SYNTHROID) 125 MCG tablet Take 1 tablet (125 mcg total) by mouth daily before breakfast. 30 tablet  3   lipase/protease/amylase (CREON) 36000 UNITS CPEP capsule Take 72,000 Units by mouth 3 (three) times daily with meals.     magnesium (MAGTAB) 84 MG ( ) TBCR SR tablet Take 84 mg by mouth daily.     metFORMIN (GLUCOPHAGE) 500 MG tablet Take 1 tablet (500 mg total) by mouth daily with breakfast. 90 tablet 1   nitrofurantoin, macrocrystal-monohydrate, (MACROBID) 100 MG capsule Take 1 capsule (100 mg total) by mouth 2 (two) times daily. 10 capsule 0   nitroGLYCERIN (NITROSTAT) 0.4 MG SL tablet Place 1 tablet (0.4 mg total) under the tongue every 5 (five) minutes as needed for chest pain. 25 tablet 2   potassium chloride (KLOR-CON) 10 MEQ tablet TAKE 2 TABLETS BY MOUTH ON MONDAY, WEDNESDAY AND FRIDAY, THEN 1 TABLET ONCE DAILY ON TUESDAY, THURSDAY, SATURDAY AND SUNDAY 135 tablet 3   tamsulosin (FLOMAX) 0.4 MG CAPS capsule Take 1 capsule (0.4 mg total) by mouth daily after supper. 30 capsule 0   amoxicillin-clavulanate (AUGMENTIN) 875-125 MG tablet Take 1 tablet by mouth 2 (two) times daily. (Patient not taking: Reported on 11/01/2023) 14 tablet 0   fluticasone (FLONASE) 50 MCG/ACT nasal spray Place 2 sprays into both nostrils daily. (Patient not taking: Reported on 11/24/2023) 16 g 1   No current facility-administered medications for this visit.     Objective: Vital signs in last 24 hours: BP (!) 149/74   Pulse (!) 51   Intake/Output from previous day: No intake/output data recorded. Intake/Output this shift: @IOTHISSHIFT @   Physical Exam Vitals reviewed.  Constitutional:      Appearance: Normal appearance.  Neurological:     Mental Status: He is alert.     Lab Results:  Results for orders placed or performed in visit on 11/24/23 (from the past 24 hours)  Urinalysis, Routine w reflex microscopic     Status: Abnormal    Collection Time: 11/24/23 10:20 AM  Result Value Ref Range   Specific Gravity, UA 1.010 1.005 - 1.030   pH, UA 6.0 5.0 - 7.5   Color, UA Yellow Yellow   Appearance Ur Clear Clear   Leukocytes,UA Trace (A) Negative   Protein,UA 2+ (A) Negative/Trace   Glucose, UA Negative Negative   Ketones, UA Trace (A) Negative   RBC, UA 3+ (A) Negative   Bilirubin, UA Negative Negative   Urobilinogen, Ur 1.0 0.2 - 1.0 mg/dL   Nitrite, UA Negative Negative   Microscopic Examination See below:    Narrative   Performed at:  [address] - [facility] [place] [address] [place], [place]  [id number] Lab Director: Chinita Pester MT, Phone:  [phone]  Microscopic Examination     Status: Abnormal   Collection Time: 11/24/23 10:20 AM   Urine  Result Value Ref Range   WBC, UA 6-10 (A) 0 - 5 /hpf   RBC, Urine >30 (A) 0 - 2 /hpf   Epithelial Cells (non renal) 0-10 0 - 10 /hpf   Casts Present (A) None seen /lpf   Cast Type Granular casts (A) N/A   Bacteria, UA Few (A) None seen/Few   Narrative   Performed at:  [address] - [facility] [place] [address], [place], [place]  [id number] Lab Director: Chinita Pester MT, Phone:  [phone]    BMET No results for input(s): "NA", "K", "CL", "CO2", "GLUCOSE", "BUN", "CREATININE", "CALCIUM" in the last 72 hours. PT/INR No results for input(s): "LABPROT", "INR" in the last 72 hours. ABG No results for input(s): "PHART", "HCO3" in the last 72  hours.  Invalid input(s): "PCO2", "PO2" UA has >30 RBC's  Labs in EPIC and CareEverywhere reviewed.   Studies/Results: No results found. I have reviewed his CT from 09/21/23 CT HEMATURIA WORKUP Result Date: 09/21/2023 CLINICAL DATA:  hematuria and flank pain. EXAM: CT ABDOMEN AND PELVIS WITHOUT AND WITH CONTRAST TECHNIQUE: Multidetector CT imaging of the abdomen and pelvis was performed following the standard protocol before and following the bolus administration of intravenous contrast. RADIATION DOSE REDUCTION: This  exam was performed according to the departmental dose-optimization program which includes automated exposure control, adjustment of the mA and/or kV according to patient size and/or use of iterative reconstruction technique. CONTRAST:  OMNIPAQUE IOHEXOL 300 MG/ML  SOLN COMPARISON:  CT scan abdomen and pelvis from 01/23/2023. FINDINGS: Lower chest: There are patchy atelectatic changes in the visualized lung bases. No overt consolidation. No pleural effusion. The heart is normal in size. No pericardial effusion. There are coronary artery atherosclerotic calcifications, in keeping with coronary artery disease. Hepatobiliary: The liver is normal in size. Non-cirrhotic configuration. No suspicious mass. No intrahepatic or extrahepatic bile duct dilation. Gallbladder is surgically absent. Pancreas: Unremarkable. No pancreatic ductal dilatation or surrounding inflammatory changes. Spleen: Within normal limits. No focal lesion. Adrenals/Urinary Tract: Adrenal glands are unremarkable. No suspicious renal mass. There is a 9.0 x 9.6 cm simple cyst arising from the right kidney lower pole. There is additional smaller cyst in the right kidney lower pole, medially. No hydroureteronephrosis on either side. There are at least 3, sub 4 mm, nonobstructing calculi in the right kidney. There are at least 3, 1-1.5 mm nonobstructing calculi in the left kidney. Urinary bladder is under distended, precluding optimal assessment. However, no large mass or stones identified. No perivesical fat stranding. Stomach/Bowel: There is a small sliding hiatal hernia. There are at least 2 diverticula arising from the duodenum. No disproportionate dilation of the small or large bowel loops. No evidence of abnormal bowel wall thickening or inflammatory changes. The appendix is unremarkable. There are multiple diverticula throughout the colon, without imaging signs of diverticulitis. Vascular/Lymphatic: No ascites or pneumoperitoneum. No abdominal  or pelvic lymphadenopathy, by size criteria. No aneurysmal dilation of the major abdominal arteries. There are moderate peripheral atherosclerotic vascular calcifications of the aorta and its major branches. Reproductive: Enlarged prostate. Symmetric seminal vesicles. Other: There are small fat containing umbilical and right inguinal hernias. The soft tissues and abdominal wall are otherwise unremarkable. Musculoskeletal: No suspicious osseous lesions. There are mild multilevel degenerative changes in the visualized spine. IMPRESSION: 1. There are bilateral, sub 4 mm, nonobstructing renal calculi. No ureterolithiasis or obstructive uropathy on either side. 2. No suspicious renal, ureteric or urinary bladder mass. 3. Multiple other nonacute observations (such as large right renal cyst, small sliding hiatal hernia. Duodenal and colonic diverticula, etc.), As described above. Electronically Signed   By: Jules Schick M.D.   On: 09/21/2023 11:17   Procedure: He was prepped with betadine and 2% lidocaine jelly for a cystoscopy but he had a severe vasovagal reaction and the procedure was aborted.   Assessment/Plan: Right flank pain with enlarging right renal cyst, stones and microhematuria.  He had non-obstructing stones on the CT, but has had some flank pain since.   I will get a KUB.    Microhematuria and recent gross hematuria.    He was unable to tolerate the cystoscopy prep today and had a vasovagal reaction.   I will reschedule that with valium.  Meds ordered this encounter  Medications   diazepam (  VALIUM) 10 MG tablet    Sig: 1-2 tabs po 1 hour prior to procedure.    Dispense:  2 tablet    Refill:  0     Orders Placed This Encounter  Procedures   Microscopic Examination   DG Abd 1 View    Reason for Exam (SYMPTOM  OR DIAGNOSIS REQUIRED):   renal stone    Preferred imaging location?:   [facility]    Radiology Contrast Protocol - do NOT remove file path:    [email or web address]   Urinalysis, Routine w reflex microscopic   Cystoscopy     Return for Needs cysto rescheduled with me or next available MD appt.  Valium script sent. .    CC: Dr. Antony Haste.      Bjorn Pippin 11/25/2023

## 2023-12-01 ENCOUNTER — Other Ambulatory Visit: Payer: Self-pay

## 2023-12-01 ENCOUNTER — Emergency Department (HOSPITAL_COMMUNITY)
Admission: EM | Admit: 2023-12-01 | Discharge: 2023-12-01 | Disposition: A | Discharge status: Home / Self Care | Attending: Emergency Medicine | Admitting: Emergency Medicine

## 2023-12-01 ENCOUNTER — Encounter (HOSPITAL_COMMUNITY): Payer: Self-pay

## 2023-12-01 ENCOUNTER — Emergency Department (HOSPITAL_COMMUNITY)

## 2023-12-01 DIAGNOSIS — R109 Unspecified abdominal pain: Secondary | ICD-10-CM | POA: Insufficient documentation

## 2023-12-01 DIAGNOSIS — Z85850 Personal history of malignant neoplasm of thyroid: Secondary | ICD-10-CM | POA: Insufficient documentation

## 2023-12-01 DIAGNOSIS — Z79899 Other long term (current) drug therapy: Secondary | ICD-10-CM | POA: Diagnosis not present

## 2023-12-01 DIAGNOSIS — E039 Hypothyroidism, unspecified: Secondary | ICD-10-CM | POA: Diagnosis not present

## 2023-12-01 DIAGNOSIS — I2510 Atherosclerotic heart disease of native coronary artery without angina pectoris: Secondary | ICD-10-CM | POA: Insufficient documentation

## 2023-12-01 DIAGNOSIS — E119 Type 2 diabetes mellitus without complications: Secondary | ICD-10-CM | POA: Diagnosis not present

## 2023-12-01 DIAGNOSIS — I10 Essential (primary) hypertension: Secondary | ICD-10-CM | POA: Diagnosis not present

## 2023-12-01 DIAGNOSIS — Z7901 Long term (current) use of anticoagulants: Secondary | ICD-10-CM | POA: Insufficient documentation

## 2023-12-01 DIAGNOSIS — I480 Paroxysmal atrial fibrillation: Secondary | ICD-10-CM | POA: Insufficient documentation

## 2023-12-01 LAB — CBC WITH DIFFERENTIAL/PLATELET
Abs Immature Granulocytes: 0.07 10*3/uL (ref 0.00–0.07)
Basophils Absolute: 0 10*3/uL (ref 0.0–0.1)
Basophils Relative: 0 %
Eosinophils Absolute: 0.1 10*3/uL (ref 0.0–0.5)
Eosinophils Relative: 1 %
HCT: 44.1 % (ref 39.0–52.0)
Hemoglobin: 15.5 g/dL (ref 13.0–17.0)
Immature Granulocytes: 1 %
Lymphocytes Relative: 12 %
Lymphs Abs: 1.4 10*3/uL (ref 0.7–4.0)
MCH: 32 pg (ref 26.0–34.0)
MCHC: 35.1 g/dL (ref 30.0–36.0)
MCV: 91.1 fL (ref 80.0–100.0)
Monocytes Absolute: 1.2 10*3/uL — ABNORMAL HIGH (ref 0.1–1.0)
Monocytes Relative: 10 %
Neutro Abs: 9.3 10*3/uL — ABNORMAL HIGH (ref 1.7–7.7)
Neutrophils Relative %: 76 %
Platelets: 202 10*3/uL (ref 150–400)
RBC: 4.84 MIL/uL (ref 4.22–5.81)
RDW: 12.3 % (ref 11.5–15.5)
WBC: 12.1 10*3/uL — ABNORMAL HIGH (ref 4.0–10.5)
nRBC: 0 % (ref 0.0–0.2)

## 2023-12-01 LAB — COMPREHENSIVE METABOLIC PANEL WITH GFR
ALT: 24 U/L (ref 0–44)
AST: 17 U/L (ref 15–41)
Albumin: 4.2 g/dL (ref 3.5–5.0)
Alkaline Phosphatase: 77 U/L (ref 38–126)
Anion gap: 11 (ref 5–15)
BUN: 9 mg/dL (ref 8–23)
CO2: 26 mmol/L (ref 22–32)
Calcium: 8.8 mg/dL — ABNORMAL LOW (ref 8.9–10.3)
Chloride: 98 mmol/L (ref 98–111)
Creatinine, Ser: 0.91 mg/dL (ref 0.61–1.24)
GFR, Estimated: >60 mL/min (ref >60)
Glucose, Bld: 107 mg/dL — ABNORMAL HIGH (ref 70–99)
Potassium: 3.2 mmol/L — ABNORMAL LOW (ref 3.5–5.1)
Sodium: 135 mmol/L (ref 135–145)
Total Bilirubin: 2 mg/dL — ABNORMAL HIGH (ref 0.0–1.2)
Total Protein: 7.2 g/dL (ref 6.5–8.1)

## 2023-12-01 LAB — URINALYSIS, ROUTINE W REFLEX MICROSCOPIC
Bilirubin Urine: NEGATIVE
Glucose, UA: NEGATIVE mg/dL
Ketones, ur: NEGATIVE mg/dL
Leukocytes,Ua: NEGATIVE
Nitrite: NEGATIVE
Protein, ur: NEGATIVE mg/dL
Specific Gravity, Urine: 1.005 (ref 1.005–1.030)
pH: 7 (ref 5.0–8.0)

## 2023-12-01 MED ORDER — ONDANSETRON HCL 4 MG/2ML IJ SOLN
4.0000 mg | Freq: Once | INTRAMUSCULAR | Status: AC
  Administered 2023-12-01: 4 mg via INTRAVENOUS
  Filled 2023-12-01: qty 2

## 2023-12-01 MED ORDER — POTASSIUM CHLORIDE CRYS ER 20 MEQ PO TBCR
40.0000 meq | EXTENDED_RELEASE_TABLET | Freq: Once | ORAL | Status: AC
  Administered 2023-12-01: 40 meq via ORAL
  Filled 2023-12-01: qty 2

## 2023-12-01 MED ORDER — HYDROMORPHONE HCL 1 MG/ML IJ SOLN
0.5000 mg | Freq: Once | INTRAMUSCULAR | Status: AC
  Administered 2023-12-01: 0.5 mg via INTRAVENOUS
  Filled 2023-12-01: qty 0.5

## 2023-12-01 MED ORDER — SODIUM CHLORIDE 0.9 % IV BOLUS
1000.0000 mL | Freq: Once | INTRAVENOUS | Status: AC
  Administered 2023-12-01: 1000 mL via INTRAVENOUS

## 2023-12-01 NOTE — ED Triage Notes (Signed)
 Pt arrived via POV for recurrent right flank pain. Pt reports Hx of hematuria and renal calculi.

## 2023-12-01 NOTE — ED Notes (Signed)
 Patient transported to CT

## 2023-12-01 NOTE — Discharge Instructions (Signed)
 We evaluated you for your flank pain.  Your testing in the emergency department is reassuring.  It is possible that you had a kidney stone which is already passed.  Please follow-up closely with your primary doctor and your urologist.  Please return to the emergency department for any new or worsening symptoms such as severe pain, lightheadedness or dizziness, fevers or chills, vomiting, or any other new symptoms.

## 2023-12-01 NOTE — ED Provider Notes (Signed)
 [place] [facility]MENT AT [facility] Provider Note  CSN: [id number] Arrival date & time: 12/01/23 1456  Chief Complaint(s) Flank Pain  HPI William Mcmahon is a 63 y.o. male history of coronary artery disease, diabetes, hypertension, hyperlipidemia, atrial fibrillation presenting to the emergency department for right flank pain.  Patient reports right flank pain rating from his flank down to his lower abdomen.  Reports this feels like prior kidney stones.  He also reports some blood in his urine.  No fevers or chills.  No vomiting, reports earlier had some nausea.  No lightheadedness or dizziness.  No fainting.  No diarrhea.   Past Medical History Past Medical History:  Diagnosis Date   CAD in native artery, with prior stents, and instent restenosis of small vessel, medical therapy, LAD and RCA stents are patent 09/12/2018   Cancer (HCC)    thyroid   DM (diabetes mellitus), type 2 (HCC) diet controlled 09/12/2018   HLD (hyperlipidemia) 09/12/2018   Hyperlipidemia    Hypertension    PAF (paroxysmal atrial fibrillation) (HCC)    Syncope- may be due to diuretics 09/12/2018   Thyroid disease    Patient Active Problem List   Diagnosis Date Noted   Acute non-recurrent frontal sinusitis 10/12/2023   Bilateral impacted cerumen 10/12/2023   Renal cyst 05/05/2023   Renal stones 05/05/2023   Type 2 diabetes mellitus without complication, without long-term current use of insulin (HCC) 05/05/2023   Idiopathic chronic pancreatitis (HCC) 02/01/2023   Hypomagnesemia 01/25/2023   Jaw pain 01/25/2023   Generalized weakness 01/23/2023   AKI (acute kidney injury) (HCC) 01/23/2023   Acute diarrhea 01/23/2023   Dehydration 01/23/2023   Other fatigue 01/23/2023   Dyspnea 01/23/2023   Leukocytosis 03/31/2021   Hypophosphatemia 03/31/2021   Hypothyroidism 03/31/2021   Hyperglycemia 03/31/2021   Obesity (BMI 30.0-34.9) 03/31/2021   Atypical chest pain 03/30/2021   PAF (paroxysmal  atrial fibrillation) (HCC) 12/01/2020   Dysphagia 12/01/2020   Demand ischemia (HCC) 09/22/2020   Atrial fibrillation with RVR (HCC) 09/21/2020   History of thyroid cancer 03/31/2020   Educated about COVID-19 virus infection 02/07/2019   Hypercholesteremia 01/15/2019   Essential hypertension 11/29/2018   Palpitations 11/29/2018   SOB (shortness of breath) 11/29/2018   CAD in native artery, with prior stents, and instent restenosis of small vessel, medical therapy, LAD and RCA stents are patent 09/12/2018   HLD (hyperlipidemia) 09/12/2018   DM (diabetes mellitus), type 2 (HCC) diet controlled 09/12/2018   Hypokalemia 09/12/2018   Syncope- may be due to diuretics 09/12/2018   Unstable angina (HCC) 09/10/2018   Bilateral numbness and tingling of arms and legs 10/21/2017   Unintentional weight loss 10/21/2017   Postoperative hypothyroidism 06/27/2017   Transaminitis 06/27/2017   Coronary artery disease 06/27/2017   Home Medication(s) Prior to Admission medications   Medication Sig Start Date End Date Taking? Authorizing Provider  amLODipine (NORVASC) 5 MG tablet Take 1.5 tablets (7.5 mg total) by mouth daily. 11/01/23   Rollene Rotunda, MD  amoxicillin-clavulanate (AUGMENTIN) 875-125 MG tablet Take 1 tablet by mouth 2 (two) times daily. Patient not taking: Reported on 11/01/2023 10/12/23   Anabel Halon, MD  apixaban (ELIQUIS) 5 MG TABS tablet Take 1 tablet (5 mg total) by mouth 2 (two) times daily. 11/01/23   Rollene Rotunda, MD  atorvastatin (LIPITOR) 80 MG tablet Take 1 tablet (80 mg total) by mouth daily. 11/01/23   Rollene Rotunda, MD  carvedilol (COREG) 12.5 MG tablet Take 1 tablet (12.5 mg  total) by mouth 2 (two) times daily with a meal. 11/01/23   Rollene Rotunda, MD  clopidogrel (PLAVIX) 75 MG tablet Take 1 tablet (75 mg total) by mouth daily. 11/01/23   Rollene Rotunda, MD  colestipol (COLESTID) 1 g tablet Take 1 g by mouth 2 (two) times daily. 10/21/22   [provider]  diazepam  (VALIUM) 10 MG tablet 1-2 tabs po 1 hour prior to procedure. 11/24/23   Bjorn Pippin, MD  dicyclomine (BENTYL) 20 MG tablet Take 20 mg by mouth 3 (three) times daily before meals. Takes two hours before or five hours after other medications    [provider]  ezetimibe (ZETIA) 10 MG tablet Take 1 tablet (10 mg total) by mouth daily. 11/01/23   Rollene Rotunda, MD  fluticasone (FLONASE) 50 MCG/ACT nasal spray Place 2 sprays into both nostrils daily. Patient not taking: Reported on 11/24/2023 10/12/23   Anabel Halon, MD  isosorbide mononitrate (IMDUR) 60 MG 24 hr tablet Take 1 tablet (60 mg total) by mouth daily. 11/01/23   Rollene Rotunda, MD  levothyroxine (SYNTHROID) 125 MCG tablet Take 1 tablet (125 mcg total) by mouth daily before breakfast. 10/11/23   Del Newman Nip, Tenna Child, FNP  lipase/protease/amylase (CREON) 36000 UNITS CPEP capsule Take 72,000 Units by mouth 3 (three) times daily with meals.    [provider]  magnesium (MAGTAB) 84 MG ( ) TBCR SR tablet Take 84 mg by mouth daily.    [provider]  metFORMIN (GLUCOPHAGE) 500 MG tablet Take 1 tablet (500 mg total) by mouth daily with breakfast. 10/11/23   Del Newman Nip, Tenna Child, FNP  nitrofurantoin, macrocrystal-monohydrate, (MACROBID) 100 MG capsule Take 1 capsule (100 mg total) by mouth 2 (two) times daily. 11/03/23   Leath-Warren, Sadie Haber, NP  nitroGLYCERIN (NITROSTAT) 0.4 MG SL tablet Place 1 tablet (0.4 mg total) under the tongue every 5 (five) minutes as needed for chest pain. 10/28/22   Rollene Rotunda, MD  potassium chloride (KLOR-CON) 10 MEQ tablet TAKE 2 TABLETS BY MOUTH ON MONDAY, WEDNESDAY AND FRIDAY, THEN 1 TABLET ONCE DAILY ON TUESDAY, THURSDAY, SATURDAY AND SUNDAY 11/01/23   Rollene Rotunda, MD  tamsulosin (FLOMAX) 0.4 MG CAPS capsule Take 1 capsule (0.4 mg total) by mouth daily after supper. 11/03/23   Leath-Warren, Sadie Haber, NP                                                                                                                                     Past Surgical History Past Surgical History:  Procedure Laterality Date   CARDIAC SURGERY     stents   CHOLECYSTECTOMY     LEFT HEART CATH AND CORONARY ANGIOGRAPHY N/A 09/11/2018   Procedure: LEFT HEART CATH AND CORONARY ANGIOGRAPHY;  Surgeon: Marykay Lex, MD;  Location: [facility] INVASIVE CV LAB;  Service: Cardiovascular;  Laterality: N/A;   Family History Family History  Problem Relation Age of Onset  Diabetes Mother    CAD Father        s/p stent and CABG   Heart attack Maternal Grandfather     Social History Social History   Tobacco Use   Smoking status: Former   Smokeless tobacco: Never  Advertising account planner   Vaping status: Never Used  Substance Use Topics   Alcohol use: Never   Drug use: Yes    Types: Marijuana   Allergies Naproxen  Review of Systems Review of Systems  All other systems reviewed and are negative.   Physical Exam Vital Signs  I have reviewed the triage vital signs BP (!) 148/85   Pulse 68   Temp 99 F (37.2 C) (Oral)   Resp 17   Ht 5\' 10"  (1.778 m)   Wt 95 kg   SpO2 98%   BMI 30.05 kg/m  Physical Exam Vitals and nursing note reviewed.  Constitutional:      General: He is not in acute distress.    Appearance: Normal appearance.  HENT:     Mouth/Throat:     Mouth: Mucous membranes are moist.  Eyes:     Conjunctiva/sclera: Conjunctivae normal.  Cardiovascular:     Rate and Rhythm: Normal rate and regular rhythm.  Pulmonary:     Effort: Pulmonary effort is normal. No respiratory distress.     Breath sounds: Normal breath sounds.  Abdominal:     General: Abdomen is flat.     Palpations: Abdomen is soft.     Tenderness: There is no abdominal tenderness. There is no right CVA tenderness or left CVA tenderness.  Musculoskeletal:     Right lower leg: No edema.     Left lower leg: No edema.  Skin:    General: Skin is warm and dry.     Capillary Refill: Capillary refill takes less than  2 seconds.  Neurological:     Mental Status: He is alert and oriented to person, place, and time. Mental status is at baseline.  Psychiatric:        Mood and Affect: Mood normal.        Behavior: Behavior normal.     ED Results and Treatments Labs (all labs ordered are listed, but only abnormal results are displayed) Labs Reviewed  CBC WITH DIFFERENTIAL/PLATELET - Abnormal; Notable for the following components:      Result Value   WBC 12.1 (*)    Neutro Abs 9.3 (*)    Monocytes Absolute 1.2 (*)    All other components within normal limits  COMPREHENSIVE METABOLIC PANEL WITH GFR - Abnormal; Notable for the following components:   Potassium 3.2 (*)    Glucose, Bld 107 (*)    Calcium 8.8 (*)    Total Bilirubin 2.0 (*)    All other components within normal limits  URINALYSIS, ROUTINE W REFLEX MICROSCOPIC - Abnormal; Notable for the following components:   Hgb urine dipstick LARGE (*)    Bacteria, UA FEW (*)    All other components within normal limits  Radiology CT Renal Stone Study Result Date: 12/01/2023 CLINICAL DATA:  Recurrent right flank pain. EXAM: CT ABDOMEN AND PELVIS WITHOUT CONTRAST TECHNIQUE: Multidetector CT imaging of the abdomen and pelvis was performed following the standard protocol without IV contrast. RADIATION DOSE REDUCTION: This exam was performed according to the departmental dose-optimization program which includes automated exposure control, adjustment of the mA and/or kV according to patient size and/or use of iterative reconstruction technique. COMPARISON:  September 21, 2023. FINDINGS: Lower chest: No acute abnormality. Hepatobiliary: No focal liver abnormality is seen. Status post cholecystectomy. No biliary dilatation. Pancreas: Unremarkable. No pancreatic ductal dilatation or surrounding inflammatory changes. Spleen: Normal in size without  focal abnormality. Adrenals/Urinary Tract: Adrenal glands appear normal. Bilateral nonobstructive nephrolithiasis is noted. Large right renal cyst is again noted. No hydronephrosis or renal obstruction is noted. Urinary bladder is unremarkable. Stomach/Bowel: Small sliding-type hiatal hernia. There is no evidence of bowel obstruction or inflammation. The appendix appears normal. Mild diverticulosis is noted throughout the colon. Vascular/Lymphatic: Aortic atherosclerosis. No enlarged abdominal or pelvic lymph nodes. Reproductive: Prostate is unremarkable. Other: No ascites or hernia is noted. Musculoskeletal: No acute or significant osseous findings. IMPRESSION: Bilateral nonobstructive nephrolithiasis. Mild diverticulosis is noted throughout the colon without inflammation. Small sliding-type hiatal hernia. Aortic Atherosclerosis (ICD10-I70.0).1 Electronically Signed   By: Lupita Raider M.D.   On: 12/01/2023 19:14    Pertinent labs & imaging results that were available during my care of the patient were reviewed by me and considered in my medical decision making (see MDM for details).  Medications Ordered in ED Medications  potassium chloride SA (KLOR-CON M) CR tablet 40 mEq (has no administration in time range)  sodium chloride 0.9 % bolus 1,000 mL (1,000 mLs Intravenous New Bag/Given 12/01/23 1839)  HYDROmorphone (DILAUDID) injection 0.5 mg (0.5 mg Intravenous Given 12/01/23 1842)  ondansetron (ZOFRAN) injection 4 mg (4 mg Intravenous Given 12/01/23 1842)                                                                                                                                     Procedures Procedures  (including critical care time)  Medical Decision Making / ED Course   MDM:  63 year old presenting to the emergency department with flank pain.  Patient overall well-appearing, physical examination with no CVA tenderness.  No abdominal tenderness.  Patient reports symptoms feel similar to  prior kidney stone, did have hematuria, CT abdomen pelvis was obtained, no sign of kidney stone but does have some nonobstructive stone.  Possible patient had passed stone in interim.  He does report his symptoms are much improved in the emergency department.  Urinalysis with some trace bacteria but otherwise not concerning for UTI.  No CVA tenderness to suggest pyelonephritis.  Given that he feels much better, feel patient is stable for discharge.  Low concern for other process such as diverticulitis, perforation, obstruction, volvulus, aortic pathology. Will discharge patient to  home. All questions answered. Patient comfortable with plan of discharge. Return precautions discussed with patient and specified on the after visit summary.       Additional history obtained:  -External records from outside source obtained and reviewed including: Chart review including previous notes, labs, imaging, consultation notes including prior notes    Lab Tests: -I ordered, reviewed, and interpreted labs.   The pertinent results include:   Labs Reviewed  CBC WITH DIFFERENTIAL/PLATELET - Abnormal; Notable for the following components:      Result Value   WBC 12.1 (*)    Neutro Abs 9.3 (*)    Monocytes Absolute 1.2 (*)    All other components within normal limits  COMPREHENSIVE METABOLIC PANEL WITH GFR - Abnormal; Notable for the following components:   Potassium 3.2 (*)    Glucose, Bld 107 (*)    Calcium 8.8 (*)    Total Bilirubin 2.0 (*)    All other components within normal limits  URINALYSIS, ROUTINE W REFLEX MICROSCOPIC - Abnormal; Notable for the following components:   Hgb urine dipstick LARGE (*)    Bacteria, UA FEW (*)    All other components within normal limits    Notable for mild leukocytosis , hypokalemia  Imaging Studies ordered: I ordered imaging studies including CT renal protocol  On my interpretation imaging demonstrates no nephrolithiasis  I independently visualized and  interpreted imaging. I agree with the radiologist interpretation   Medicines ordered and prescription drug management: Meds ordered this encounter  Medications   sodium chloride 0.9 % bolus 1,000 mL   HYDROmorphone (DILAUDID) injection 0.5 mg   ondansetron (ZOFRAN) injection 4 mg   potassium chloride SA (KLOR-CON M) CR tablet 40 mEq    -I have reviewed the patients home medicines and have made adjustments as needed  Reevaluation: After the interventions noted above, I reevaluated the patient and found that their symptoms have improved  Co morbidities that complicate the patient evaluation  Past Medical History:  Diagnosis Date   CAD in native artery, with prior stents, and instent restenosis of small vessel, medical therapy, LAD and RCA stents are patent 09/12/2018   Cancer (HCC)    thyroid   DM (diabetes mellitus), type 2 (HCC) diet controlled 09/12/2018   HLD (hyperlipidemia) 09/12/2018   Hyperlipidemia    Hypertension    PAF (paroxysmal atrial fibrillation) (HCC)    Syncope- may be due to diuretics 09/12/2018   Thyroid disease       Dispostion: Disposition decision including need for hospitalization was considered, and patient discharged from emergency department.    Final Clinical Impression(s) / ED Diagnoses Final diagnoses:  Right flank pain     This chart was dictated using voice recognition software.  Despite best efforts to proofread,  errors can occur which can change the documentation meaning.    Lonell Grandchild, MD 12/01/23 2020

## 2024-01-05 ENCOUNTER — Encounter: Payer: Self-pay | Admitting: Cardiology

## 2024-01-05 ENCOUNTER — Ambulatory Visit: Payer: Medicare Other | Admitting: Family Medicine

## 2024-01-05 ENCOUNTER — Ambulatory Visit (INDEPENDENT_AMBULATORY_CARE_PROVIDER_SITE_OTHER)

## 2024-01-05 VITALS — BP 134/74 | HR 58 | Ht 70.0 in | Wt 204.0 lb

## 2024-01-05 DIAGNOSIS — R3121 Asymptomatic microscopic hematuria: Secondary | ICD-10-CM | POA: Diagnosis not present

## 2024-01-05 MED ORDER — NITROFURANTOIN MONOHYD MACRO 100 MG PO CAPS
100.0000 mg | ORAL_CAPSULE | Freq: Two times a day (BID) | ORAL | 0 refills | Status: AC
Start: 2024-01-05 — End: 2024-01-12

## 2024-01-05 NOTE — Progress Notes (Signed)
 Established Patient Office Visit  Subjective   Patient ID: William Mcmahon, male    DOB: 02-12-1961  Age: 63 y.o. MRN: [id number]  Chief Complaint  Patient presents with   Medical Management of Chronic Issues    Pt states having a Cystocopy and needs a antibiotic started before procedure     HPI Pt is requesting oral antibiotic for upcoming cystoscopy.   Patient Active Problem List   Diagnosis Date Noted   Acute non-recurrent frontal sinusitis 10/12/2023   Bilateral impacted cerumen 10/12/2023   Renal cyst 05/05/2023   Renal stones 05/05/2023   Type 2 diabetes mellitus without complication, without long-term current use of insulin  (HCC) 05/05/2023   Idiopathic chronic pancreatitis (HCC) 02/01/2023   Hypomagnesemia 01/25/2023   Jaw pain 01/25/2023   Generalized weakness 01/23/2023   AKI (acute kidney injury) (HCC) 01/23/2023   Acute diarrhea 01/23/2023   Dehydration 01/23/2023   Other fatigue 01/23/2023   Dyspnea 01/23/2023   Leukocytosis 03/31/2021   Hypophosphatemia 03/31/2021   Hypothyroidism 03/31/2021   Hyperglycemia 03/31/2021   Obesity (BMI 30.0-34.9) 03/31/2021   Atypical chest pain 03/30/2021   PAF (paroxysmal atrial fibrillation) (HCC) 12/01/2020   Dysphagia 12/01/2020   Demand ischemia (HCC) 09/22/2020   Atrial fibrillation with RVR (HCC) 09/21/2020   History of thyroid  cancer 03/31/2020   Educated about COVID-19 virus infection 02/07/2019   Hypercholesteremia 01/15/2019   Essential hypertension 11/29/2018   Palpitations 11/29/2018   SOB (shortness of breath) 11/29/2018   CAD in native artery, with prior stents, and instent restenosis of small vessel, medical therapy, LAD and RCA stents are patent 09/12/2018   HLD (hyperlipidemia) 09/12/2018   DM (diabetes mellitus), type 2 (HCC) diet controlled 09/12/2018   Hypokalemia 09/12/2018   Syncope- may be due to diuretics 09/12/2018   Unstable angina (HCC) 09/10/2018   Bilateral numbness and tingling of arms  and legs 10/21/2017   Unintentional weight loss 10/21/2017   Postoperative hypothyroidism 06/27/2017   Transaminitis 06/27/2017   Coronary artery disease 06/27/2017   Past Medical History:  Diagnosis Date   CAD in native artery, with prior stents, and instent restenosis of small vessel, medical therapy, LAD and RCA stents are patent 09/12/2018   Cancer (HCC)    thyroid    DM (diabetes mellitus), type 2 (HCC) diet controlled 09/12/2018   HLD (hyperlipidemia) 09/12/2018   Hyperlipidemia    Hypertension    PAF (paroxysmal atrial fibrillation) (HCC)    Syncope- may be due to diuretics 09/12/2018   Thyroid  disease       ROS    Objective:     BP 134/74   Pulse (!) 58   Ht 5\' 10"  (1.778 m)   Wt 204 lb 0.6 oz (92.6 kg)   SpO2 95%   BMI 29.28 kg/m  BP Readings from Last 3 Encounters:  01/05/24 134/74  12/01/23 (!) 142/91  11/24/23 (!) 149/74     Physical Exam Vitals and nursing note reviewed.  Constitutional:      Appearance: Normal appearance.  Eyes:     Extraocular Movements: Extraocular movements intact.     Pupils: Pupils are equal, round, and reactive to light.  Cardiovascular:     Rate and Rhythm: Normal rate and regular rhythm.  Pulmonary:     Effort: Pulmonary effort is normal.     Breath sounds: Normal breath sounds.  Abdominal:     Tenderness: There is no right CVA tenderness or left CVA tenderness.  Neurological:     Mental Status: He  is alert and oriented to person, place, and time.  Psychiatric:        Mood and Affect: Mood normal.        Thought Content: Thought content normal.      No results found for any visits on 01/05/24.    The ASCVD Risk score (Arnett DK, et al., 2019) failed to calculate for the following reasons:   The valid total cholesterol range is 130 to 320 mg/dL    Assessment & Plan:   Problem List Items Addressed This Visit   None Visit Diagnoses       Asymptomatic microscopic hematuria    -  Primary   Agree to add oral  atbx for upcoming cystoscopy   Relevant Medications   nitrofurantoin , macrocrystal-monohydrate, (MACROBID ) 100 MG capsule       No follow-ups on file.    Alison Irvine, FNP

## 2024-01-09 NOTE — Progress Notes (Deleted)
 This 63 year old male comes in today for cystoscopy.  Previously, he has been followed by Dr. Homero Luster.  Most recent visit was on 27 March.  Cystoscopy was attempted but he had a vasovagal reaction.  He is pretreated with Valium  today.  Most recent note is below: Subjective: No diagnosis found.    11/24/23: William Mcmahon returns today in f/u.  He was in the ER on 11/03/23 with a possible stone but didn't have imaging.  UA had large blood.  Urine culture was negative. He was given flomax .  He had dark urine and right flank pain but had no imaging He had a CT hematuria study on 09/21/23 that showed bilateral small renal stones and a large right renal cyst.   06/23/23: William Mcmahon is a 63 yo male who has a one month history of right flank pain that is variably severe and can keep him up at night.  He has had no hematuria.  He has had no nausea.  He had a CT AP without contrast in 5/24 that shows a 9cm RLP cyst and small non-obstructing right renal stones.   He had a CT and [facility] in 2020 and the cyst was not mentioned but it was 8.1cm on a CT with [facility] in 12/23.Aaron Aas  He feels a bulge on the right.  He has chronic nocturia x 4 and some urgency. HIs IPSS is 7.  He has had prior stones and he had ESWL here several years ago.  His last Cr was 1.09 in July.  He was hospitalized in July for e. Coli and then again for afib.   He is on Eliquis  and Plavix  with a history of afib and CAD with prior stents.  He is a former smoker. His PSA was 0.5 in 2022.      ROS:  Review of Systems  All other systems reviewed and are negative.   Allergies  Allergen Reactions   Naproxen Swelling    Past Medical History:  Diagnosis Date   CAD in native artery, with prior stents, and instent restenosis of small vessel, medical therapy, LAD and RCA stents are patent 09/12/2018   Cancer (HCC)    thyroid    DM (diabetes mellitus), type 2 (HCC) diet controlled 09/12/2018   HLD (hyperlipidemia) 09/12/2018   Hyperlipidemia    Hypertension     PAF (paroxysmal atrial fibrillation) (HCC)    Syncope- may be due to diuretics 09/12/2018   Thyroid  disease     Past Surgical History:  Procedure Laterality Date   CARDIAC SURGERY     stents   CHOLECYSTECTOMY     LEFT HEART CATH AND CORONARY ANGIOGRAPHY N/A 09/11/2018   Procedure: LEFT HEART CATH AND CORONARY ANGIOGRAPHY;  Surgeon: Arleen Lacer, MD;  Location: [facility] INVASIVE CV LAB;  Service: Cardiovascular;  Laterality: N/A;    Social History   Socioeconomic History   Marital status: Married    Spouse name: [name]   Number of children: Not on file   Years of education: Not on file   Highest education level: Associate degree: occupational, technical, or vocational program  Occupational History   Not on file  Tobacco Use   Smoking status: Former   Smokeless tobacco: Never  Vaping Use   Vaping status: Never Used  Substance and Sexual Activity   Alcohol use: Never   Drug use: Yes    Types: Marijuana   Sexual activity: Not Currently    Partners: Female  Other Topics Concern   Not on file  Social History Narrative   Not on file   Social Drivers of Health   Financial Resource Strain: Low Risk  (09/29/2023)   Overall Financial Resource Strain (CARDIA)    Difficulty of Paying Living Expenses: Not hard at all  Food Insecurity: No Food Insecurity (09/29/2023)   Hunger Vital Sign    Worried About Running Out of Food in the Last Year: Never true    Ran Out of Food in the Last Year: Never true  Transportation Needs: No Transportation Needs (09/29/2023)   PRAPARE - Administrator, Civil Service (Medical): No    Lack of Transportation (Non-Medical): No  Physical Activity: Sufficiently Active (09/29/2023)   Exercise Vital Sign    Days of Exercise per Week: 5 days    Minutes of Exercise per Session: 60 min  Stress: Stress Concern Present (09/29/2023)   [employer] of Occupational Health - Occupational Stress Questionnaire    Feeling of Stress : To some  extent  Social Connections: Socially Isolated (09/29/2023)   Social Connection and Isolation Panel [NHANES]    Frequency of Communication with Friends and Family: More than three times a week    Frequency of Social Gatherings with Friends and Family: Twice a week    Attends Religious Services: Never    Database administrator or Organizations: No    Attends Engineer, structural: Not on file    Marital Status: Separated  Intimate Partner Violence: Not At Risk (03/23/2023)   Received from [facility]   HITS    Over the last 12 months how often did your partner physically hurt you?: Never    Over the last 12 months how often did your partner insult you or talk down to you?: Sometimes    Over the last 12 months how often did your partner threaten you with physical harm?: Never    Over the last 12 months how often did your partner scream or curse at you?: Sometimes    Family History  Problem Relation Age of Onset   Diabetes Mother    CAD Father        s/p stent and CABG   Heart attack Maternal Grandfather     Anti-infectives: Anti-infectives (From admission, onward)    None       Current Outpatient Medications  Medication Sig Dispense Refill   amLODipine  (NORVASC ) 5 MG tablet Take 1.5 tablets (7.5 mg total) by mouth daily. 90 tablet 3   apixaban  (ELIQUIS ) 5 MG TABS tablet Take 1 tablet (5 mg total) by mouth 2 (two) times daily. 180 tablet 3   atorvastatin  (LIPITOR ) 80 MG tablet Take 1 tablet (80 mg total) by mouth daily. 90 tablet 3   carvedilol  (COREG ) 12.5 MG tablet Take 1 tablet (12.5 mg total) by mouth 2 (two) times daily with a meal. 180 tablet 3   clopidogrel  (PLAVIX ) 75 MG tablet Take 1 tablet (75 mg total) by mouth daily. 90 tablet 3   colestipol  (COLESTID ) 1 g tablet Take 1 g by mouth 2 (two) times daily.     diazepam  (VALIUM ) 10 MG tablet 1-2 tabs po 1 hour prior to procedure. 2 tablet 0   dicyclomine  (BENTYL ) 20 MG tablet Take 20 mg by mouth 3 (three) times  daily before meals. Takes two hours before or five hours after other medications     ezetimibe  (ZETIA ) 10 MG tablet Take 1 tablet (10 mg total) by mouth daily. 90 tablet 3   isosorbide  mononitrate (IMDUR )  60 MG 24 hr tablet Take 1 tablet (60 mg total) by mouth daily. 90 tablet 3   levothyroxine  (SYNTHROID ) 125 MCG tablet Take 1 tablet (125 mcg total) by mouth daily before breakfast. 30 tablet 3   lipase/protease/amylase (CREON ) 36000 UNITS CPEP capsule Take 72,000 Units by mouth 3 (three) times daily with meals.     magnesium  (MAGTAB) 84 MG ( ) TBCR SR tablet Take 84 mg by mouth daily.     metFORMIN  (GLUCOPHAGE ) 500 MG tablet Take 1 tablet (500 mg total) by mouth daily with breakfast. 90 tablet 1   nitrofurantoin , macrocrystal-monohydrate, (MACROBID ) 100 MG capsule Take 1 capsule (100 mg total) by mouth 2 (two) times daily for 7 days. 14 capsule 0   nitroGLYCERIN  (NITROSTAT ) 0.4 MG SL tablet Place 1 tablet (0.4 mg total) under the tongue every 5 (five) minutes as needed for chest pain. 25 tablet 2   potassium chloride  (KLOR-CON ) 10 MEQ tablet TAKE 2 TABLETS BY MOUTH ON MONDAY, WEDNESDAY AND FRIDAY, THEN 1 TABLET ONCE DAILY ON TUESDAY, THURSDAY, SATURDAY AND SUNDAY 135 tablet 3   No current facility-administered medications for this visit.     Objective: Vital signs in last 24 hours: There were no vitals taken for this visit.  Intake/Output from previous day: No intake/output data recorded. Intake/Output this shift: @IOTHISSHIFT @   Physical Exam Vitals reviewed.  Constitutional:      Appearance: Normal appearance.  Neurological:     Mental Status: He is alert.     Lab Results:  No results found for this or any previous visit (from the past 24 hours).   BMET No results for input(s): "NA", "K", "CL", "CO2", "GLUCOSE", "BUN", "CREATININE", "CALCIUM " in the last 72 hours. PT/INR No results for input(s): "LABPROT", "INR" in the last 72 hours. ABG No results for input(s):  "PHART", "HCO3" in the last 72 hours.  Invalid input(s): "PCO2", "PO2" UA has >30 RBC's  Labs in EPIC and CareEverywhere reviewed.   Studies/Results: No results found. I have reviewed his CT from 09/21/23 DG Abd 1 View Result Date: 12/03/2023 CLINICAL DATA:  renal stone EXAM: ABDOMEN - 1 VIEW COMPARISON:  September 21, 2023 FINDINGS: Nonobstructive bowel gas pattern. Several radiodense foci project over bilateral kidneys measuring up to 4 mm on the RIGHT and 3 mm on the LEFT. No definitive radiopaque densities along the courses of the ureters. Vascular calcifications. Degenerative changes of the lumbar spine. IMPRESSION: Bilateral nephrolithiasis. Electronically Signed   By: Clancy Crimes M.D.   On: 12/03/2023 15:04   CT Renal Stone Study Result Date: 12/01/2023 CLINICAL DATA:  Recurrent right flank pain. EXAM: CT ABDOMEN AND PELVIS WITHOUT CONTRAST TECHNIQUE: Multidetector CT imaging of the abdomen and pelvis was performed following the standard protocol without IV contrast. RADIATION DOSE REDUCTION: This exam was performed according to the departmental dose-optimization program which includes automated exposure control, adjustment of the mA and/or kV according to patient size and/or use of iterative reconstruction technique. COMPARISON:  September 21, 2023. FINDINGS: Lower chest: No acute abnormality. Hepatobiliary: No focal liver abnormality is seen. Status post cholecystectomy. No biliary dilatation. Pancreas: Unremarkable. No pancreatic ductal dilatation or surrounding inflammatory changes. Spleen: Normal in size without focal abnormality. Adrenals/Urinary Tract: Adrenal glands appear normal. Bilateral nonobstructive nephrolithiasis is noted. Large right renal cyst is again noted. No hydronephrosis or renal obstruction is noted. Urinary bladder is unremarkable. Stomach/Bowel: Small sliding-type hiatal hernia. There is no evidence of bowel obstruction or inflammation. The appendix appears normal.  Mild diverticulosis is noted throughout  the colon. Vascular/Lymphatic: Aortic atherosclerosis. No enlarged abdominal or pelvic lymph nodes. Reproductive: Prostate is unremarkable. Other: No ascites or hernia is noted. Musculoskeletal: No acute or significant osseous findings. IMPRESSION: Bilateral nonobstructive nephrolithiasis. Mild diverticulosis is noted throughout the colon without inflammation. Small sliding-type hiatal hernia. Aortic Atherosclerosis (ICD10-I70.0).1 Electronically Signed   By: Rosalene Colon M.D.   On: 12/01/2023 19:14   Procedure: He was prepped with betadine and 2% lidocaine  jelly for a cystoscopy but he had a severe vasovagal reaction and the procedure was aborted.   Assessment/Plan: Right flank pain with enlarging right renal cyst, stones and microhematuria.  He had non-obstructing stones on the CT, but has had some flank pain since.   I will get a KUB.    Microhematuria and recent gross hematuria.    He was unable to tolerate the cystoscopy prep today and had a vasovagal reaction.   I will reschedule that with valium .     CC: Dr. Channing Commander.      Malcolm Scrivener Beuna Bolding 01/09/2024

## 2024-01-10 ENCOUNTER — Other Ambulatory Visit: Admitting: Urology

## 2024-01-10 DIAGNOSIS — R3129 Other microscopic hematuria: Secondary | ICD-10-CM

## 2024-01-10 DIAGNOSIS — N281 Cyst of kidney, acquired: Secondary | ICD-10-CM

## 2024-01-10 DIAGNOSIS — R109 Unspecified abdominal pain: Secondary | ICD-10-CM

## 2024-01-10 DIAGNOSIS — N200 Calculus of kidney: Secondary | ICD-10-CM

## 2024-02-08 ENCOUNTER — Encounter: Payer: Self-pay | Admitting: Family Medicine

## 2024-02-08 ENCOUNTER — Ambulatory Visit (INDEPENDENT_AMBULATORY_CARE_PROVIDER_SITE_OTHER): Admitting: Family Medicine

## 2024-02-08 VITALS — BP 121/71 | HR 67 | Ht 70.0 in | Wt 206.0 lb

## 2024-02-08 DIAGNOSIS — E1169 Type 2 diabetes mellitus with other specified complication: Secondary | ICD-10-CM

## 2024-02-08 DIAGNOSIS — I10 Essential (primary) hypertension: Secondary | ICD-10-CM

## 2024-02-08 DIAGNOSIS — E038 Other specified hypothyroidism: Secondary | ICD-10-CM | POA: Diagnosis not present

## 2024-02-08 DIAGNOSIS — Z7984 Long term (current) use of oral hypoglycemic drugs: Secondary | ICD-10-CM

## 2024-02-08 DIAGNOSIS — E119 Type 2 diabetes mellitus without complications: Secondary | ICD-10-CM

## 2024-02-08 NOTE — Progress Notes (Signed)
 Established Patient Office Visit   Subjective  Patient ID: William Mcmahon, male    DOB: Dec 22, 1960  Age: 63 y.o. MRN: [id number]  Chief Complaint  Patient presents with   Hypertension    Three month follow up    Sinusitis    Ear stopped up, drainage, sinus pressure has been taken mucinex     He  has a past medical history of CAD in native artery, with prior stents, and instent restenosis of small vessel, medical therapy, LAD and RCA stents are patent (09/12/2018), Cancer (HCC), DM (diabetes mellitus), type 2 (HCC) diet controlled (09/12/2018), HLD (hyperlipidemia) (09/12/2018), Hyperlipidemia, Hypertension, PAF (paroxysmal atrial fibrillation) (HCC), Syncope- may be due to diuretics (09/12/2018), and Thyroid  disease.  HPI Patient presents to the clinic for chronic follow up. For the details of today's visit, please refer to assessment and plan.   Review of Systems  Constitutional:  Negative for chills and fever.  Eyes:  Negative for blurred vision.  Respiratory:  Negative for shortness of breath.   Cardiovascular:  Negative for chest pain.  Neurological:  Negative for dizziness.      Objective:     BP 121/71   Pulse 67   Ht 5' 10 (1.778 m)   Wt 206 lb (93.4 kg)   SpO2 96%   BMI 29.56 kg/m  BP Readings from Last 3 Encounters:  02/08/24 121/71  01/05/24 134/74  12/01/23 (!) 142/91      Physical Exam Vitals reviewed.  Constitutional:      General: He is not in acute distress.    Appearance: Normal appearance. He is not ill-appearing, toxic-appearing or diaphoretic.  HENT:     Head: Normocephalic.  Eyes:     General:        Right eye: No discharge.        Left eye: No discharge.     Conjunctiva/sclera: Conjunctivae normal.  Cardiovascular:     Rate and Rhythm: Normal rate.     Pulses: Normal pulses.     Heart sounds: Normal heart sounds.  Pulmonary:     Effort: Pulmonary effort is normal. No respiratory distress.     Breath sounds: Normal breath sounds.   Abdominal:     Tenderness: There is no right CVA tenderness.  Skin:    General: Skin is warm and dry.     Capillary Refill: Capillary refill takes less than 2 seconds.  Neurological:     Mental Status: He is alert.  Psychiatric:        Mood and Affect: Mood normal.        Behavior: Behavior normal.      No results found for any visits on 02/08/24.  The ASCVD Risk score (Arnett DK, et al., 2019) failed to calculate for the following reasons:   The valid total cholesterol range is 130 to 320 mg/dL    Assessment & Plan:  Primary hypertension -     BMP8+eGFR -     Lipid panel  TSH (thyroid -stimulating hormone deficiency) -     TSH + free T4  Type 2 diabetes mellitus with other specified complication, without long-term current use of insulin  (HCC) -     Hemoglobin A1c -     Ambulatory referral to Ophthalmology  Type 2 diabetes mellitus without complication, without long-term current use of insulin  (HCC) Assessment & Plan: Last Hemoglobin A1c: 7.0 Labs: Ordered today, results pending; will follow up accordingly. The patient reports adhering to prescribed medications: Metformin  500 mg once daily  Reviewed non-pharmacological interventions, including a balanced diet rich in lean proteins, healthy fats, whole grains, and high-fiber vegetables. Emphasized reducing refined sugars and processed carbohydrates, and incorporating more fruits, leafy greens, and legumes. Education: Patient was educated on recognizing signs and symptoms of both hypoglycemia and hyperglycemia, and advised to seek emergency care if these symptoms occur. Follow-Up: Scheduled for follow-up in 3-4 months, or sooner if needed. Patient Understanding: The patient verbalized understanding of the care plan, and all questions were answered. Additional Care: Ophthalmology referral was placed. Foot exam results were within normal limits.      Return in about 4 months (around 06/09/2024), or if symptoms worsen or  fail to improve, for chronic follow-up.   Avelino Lek Amber Bail, FNP

## 2024-02-08 NOTE — Assessment & Plan Note (Signed)
 Last Hemoglobin A1c: 7.0 Labs: Ordered today, results pending; will follow up accordingly. The patient reports adhering to prescribed medications: Metformin  500 mg once daily  Reviewed non-pharmacological interventions, including a balanced diet rich in lean proteins, healthy fats, whole grains, and high-fiber vegetables. Emphasized reducing refined sugars and processed carbohydrates, and incorporating more fruits, leafy greens, and legumes. Education: Patient was educated on recognizing signs and symptoms of both hypoglycemia and hyperglycemia, and advised to seek emergency care if these symptoms occur. Follow-Up: Scheduled for follow-up in 3-4 months, or sooner if needed. Patient Understanding: The patient verbalized understanding of the care plan, and all questions were answered. Additional Care: Ophthalmology referral was placed. Foot exam results were within normal limits.

## 2024-02-08 NOTE — Patient Instructions (Signed)

## 2024-02-09 ENCOUNTER — Ambulatory Visit: Payer: Self-pay | Admitting: Family Medicine

## 2024-02-09 LAB — BMP8+EGFR
BUN/Creatinine Ratio: 10 (ref 10–24)
BUN: 9 mg/dL (ref 8–27)
CO2: 24 mmol/L (ref 20–29)
Calcium: 9.3 mg/dL (ref 8.6–10.2)
Chloride: 100 mmol/L (ref 96–106)
Creatinine, Ser: 0.87 mg/dL (ref 0.76–1.27)
Glucose: 93 mg/dL (ref 70–99)
Potassium: 4 mmol/L (ref 3.5–5.2)
Sodium: 140 mmol/L (ref 134–144)
eGFR: 98 mL/min/{1.73_m2} (ref >59)

## 2024-02-09 LAB — TSH+FREE T4
Free T4: 1.42 ng/dL (ref 0.82–1.77)
TSH: 0.787 u[IU]/mL (ref 0.450–4.500)

## 2024-02-09 LAB — LIPID PANEL
Chol/HDL Ratio: 2.9 ratio (ref 0.0–5.0)
Cholesterol, Total: 82 mg/dL — ABNORMAL LOW (ref 100–199)
HDL: 28 mg/dL — ABNORMAL LOW (ref >39)
LDL Chol Calc (NIH): 34 mg/dL (ref 0–99)
Triglycerides: 108 mg/dL (ref 0–149)
VLDL Cholesterol Cal: 20 mg/dL (ref 5–40)

## 2024-02-09 LAB — HEMOGLOBIN A1C
Est. average glucose Bld gHb Est-mCnc: 134 mg/dL
Hgb A1c MFr Bld: 6.3 % — ABNORMAL HIGH (ref 4.8–5.6)

## 2024-02-12 ENCOUNTER — Other Ambulatory Visit: Payer: Self-pay | Admitting: Family Medicine

## 2024-02-16 ENCOUNTER — Encounter: Payer: Self-pay | Admitting: Urology

## 2024-02-16 ENCOUNTER — Ambulatory Visit: Admitting: Urology

## 2024-02-16 VITALS — BP 136/72 | HR 57

## 2024-02-16 DIAGNOSIS — N200 Calculus of kidney: Secondary | ICD-10-CM

## 2024-02-16 DIAGNOSIS — N281 Cyst of kidney, acquired: Secondary | ICD-10-CM

## 2024-02-16 DIAGNOSIS — R3129 Other microscopic hematuria: Secondary | ICD-10-CM | POA: Diagnosis not present

## 2024-02-16 LAB — URINALYSIS, ROUTINE W REFLEX MICROSCOPIC
Bilirubin, UA: NEGATIVE
Glucose, UA: NEGATIVE
Ketones, UA: NEGATIVE
Leukocytes,UA: NEGATIVE
Nitrite, UA: NEGATIVE
Protein,UA: NEGATIVE
Specific Gravity, UA: 1.01 (ref 1.005–1.030)
Urobilinogen, Ur: 0.2 mg/dL (ref 0.2–1.0)
pH, UA: 6 (ref 5.0–7.5)

## 2024-02-16 LAB — MICROSCOPIC EXAMINATION: Bacteria, UA: NONE SEEN

## 2024-02-16 MED ORDER — CIPROFLOXACIN HCL 500 MG PO TABS
500.0000 mg | ORAL_TABLET | Freq: Once | ORAL | Status: AC
  Administered 2024-02-16: 500 mg via ORAL

## 2024-02-16 NOTE — Progress Notes (Signed)
 Subjective: 1. Microhematuria   2. Renal stones   3. Renal cyst     02/16/24: William Mcmahon returns today in f/u for cystoscopy.  He has taken valium  after the failed attempt at cystoscopy at his last visit.  His UA still has 11-20 RBC's.  11/24/23: William Mcmahon returns today in f/u.  He was in the ER on 11/03/23 with a possible stone but didn't have imaging.  UA had large blood.  Urine culture was negative. He was given flomax .  He had dark urine and right flank pain but had no imaging He had a CT hematuria study on 09/21/23 that showed bilateral small renal stones and a large right renal cyst.   06/23/23: William Mcmahon is a 63 yo male who has a one month history of right flank pain that is variably severe and can keep him up at night.  He has had no hematuria.  He has had no nausea.  He had a CT AP without contrast in 5/24 that shows a 9cm RLP cyst and small non-obstructing right renal stones.   He had a CT and [facility] in 2020 and the cyst was not mentioned but it was 8.1cm on a CT with [facility] in 12/23.Aaron Aas  He feels a bulge on the right.  He has chronic nocturia x 4 and some urgency. HIs IPSS is 7.  He has had prior stones and he had ESWL here several years ago.  His last Cr was 1.09 in July.  He was hospitalized in July for e. Coli and then again for afib.   He is on Eliquis  and Plavix  with a history of afib and CAD with prior stents.  He is a former smoker. His PSA was 0.5 in 2022.      ROS:  Review of Systems  All other systems reviewed and are negative.   Allergies  Allergen Reactions   Naproxen Swelling    Past Medical History:  Diagnosis Date   CAD in native artery, with prior stents, and instent restenosis of small vessel, medical therapy, LAD and RCA stents are patent 09/12/2018   Cancer (HCC)    thyroid    DM (diabetes mellitus), type 2 (HCC) diet controlled 09/12/2018   HLD (hyperlipidemia) 09/12/2018   Hyperlipidemia    Hypertension    PAF (paroxysmal atrial fibrillation) (HCC)    Syncope- may  be due to diuretics 09/12/2018   Thyroid  disease     Past Surgical History:  Procedure Laterality Date   CARDIAC SURGERY     stents   CHOLECYSTECTOMY     LEFT HEART CATH AND CORONARY ANGIOGRAPHY N/A 09/11/2018   Procedure: LEFT HEART CATH AND CORONARY ANGIOGRAPHY;  Surgeon: Arleen Lacer, MD;  Location: [facility] INVASIVE CV LAB;  Service: Cardiovascular;  Laterality: N/A;    Social History   Socioeconomic History   Marital status: Married    Spouse name: Sanjith Siwek   Number of children: Not on file   Years of education: Not on file   Highest education level: Associate degree: occupational, technical, or vocational program  Occupational History   Not on file  Tobacco Use   Smoking status: Former   Smokeless tobacco: Never  Vaping Use   Vaping status: Never Used  Substance and Sexual Activity   Alcohol use: Never   Drug use: Yes    Types: Marijuana   Sexual activity: Not Currently    Partners: Female  Other Topics Concern   Not on file  Social History Narrative   Not on  file   Social Drivers of Health   Financial Resource Strain: Low Risk  (09/29/2023)   Overall Financial Resource Strain (CARDIA)    Difficulty of Paying Living Expenses: Not hard at all  Food Insecurity: No Food Insecurity (09/29/2023)   Hunger Vital Sign    Worried About Running Out of Food in the Last Year: Never true    Ran Out of Food in the Last Year: Never true  Transportation Needs: No Transportation Needs (09/29/2023)   PRAPARE - Administrator, Civil Service (Medical): No    Lack of Transportation (Non-Medical): No  Physical Activity: Sufficiently Active (09/29/2023)   Exercise Vital Sign    Days of Exercise per Week: 5 days    Minutes of Exercise per Session: 60 min  Stress: Stress Concern Present (09/29/2023)   [employer] of Occupational Health - Occupational Stress Questionnaire    Feeling of Stress : To some extent  Social Connections: Socially Isolated (09/29/2023)    Social Connection and Isolation Panel    Frequency of Communication with Friends and Family: More than three times a week    Frequency of Social Gatherings with Friends and Family: Twice a week    Attends Religious Services: Never    Database administrator or Organizations: No    Attends Engineer, structural: Not on file    Marital Status: Separated  Intimate Partner Violence: Not At Risk (03/23/2023)   Received from [facility]   HITS    Over the last 12 months how often did your partner physically hurt you?: Never    Over the last 12 months how often did your partner insult you or talk down to you?: Sometimes    Over the last 12 months how often did your partner threaten you with physical harm?: Never    Over the last 12 months how often did your partner scream or curse at you?: Sometimes    Family History  Problem Relation Age of Onset   Diabetes Mother    CAD Father        s/p stent and CABG   Heart attack Maternal Grandfather     Anti-infectives: Anti-infectives (From admission, onward)    Start     Dose/Rate Route Frequency Ordered Stop   02/16/24 0930  CIPROFLOXACIN HCL 500 MG PO TABS        500 mg Oral  Once 02/16/24 1610         Current Outpatient Medications  Medication Sig Dispense Refill   amLODipine  (NORVASC ) 5 MG tablet Take 1.5 tablets (7.5 mg total) by mouth daily. 90 tablet 3   apixaban  (ELIQUIS ) 5 MG TABS tablet Take 1 tablet (5 mg total) by mouth 2 (two) times daily. 180 tablet 3   atorvastatin  (LIPITOR ) 80 MG tablet Take 1 tablet (80 mg total) by mouth daily. 90 tablet 3   carvedilol  (COREG ) 12.5 MG tablet Take 1 tablet (12.5 mg total) by mouth 2 (two) times daily with a meal. 180 tablet 3   clopidogrel  (PLAVIX ) 75 MG tablet Take 1 tablet (75 mg total) by mouth daily. 90 tablet 3   colestipol  (COLESTID ) 1 g tablet Take 1 g by mouth 2 (two) times daily.     diazepam  (VALIUM ) 10 MG tablet 1-2 tabs po 1 hour prior to procedure. 2 tablet 0    dicyclomine  (BENTYL ) 20 MG tablet Take 20 mg by mouth 3 (three) times daily before meals. Takes two hours before or five hours after  other medications     ezetimibe  (ZETIA ) 10 MG tablet Take 1 tablet (10 mg total) by mouth daily. 90 tablet 3   isosorbide  mononitrate (IMDUR ) 60 MG 24 hr tablet Take 1 tablet (60 mg total) by mouth daily. 90 tablet 3   levothyroxine  (SYNTHROID ) 125 MCG tablet TAKE 1 TABLET BY MOUTH ONCE DAILY BEFORE BREAKFAST 30 tablet 0   lipase/protease/amylase (CREON ) 36000 UNITS CPEP capsule Take 72,000 Units by mouth 3 (three) times daily with meals.     magnesium  (MAGTAB) 84 MG ( ) TBCR SR tablet Take 84 mg by mouth daily.     metFORMIN  (GLUCOPHAGE ) 500 MG tablet Take 1 tablet (500 mg total) by mouth daily with breakfast. 90 tablet 1   nitroGLYCERIN  (NITROSTAT ) 0.4 MG SL tablet Place 1 tablet (0.4 mg total) under the tongue every 5 (five) minutes as needed for chest pain. 25 tablet 2   potassium chloride  (KLOR-CON ) 10 MEQ tablet TAKE 2 TABLETS BY MOUTH ON MONDAY, WEDNESDAY AND FRIDAY, THEN 1 TABLET ONCE DAILY ON TUESDAY, THURSDAY, SATURDAY AND SUNDAY 135 tablet 3   Current Facility-Administered Medications  Medication Dose Route Frequency Provider Last Rate Last Admin   ciprofloxacin (CIPRO) tablet 500 mg  500 mg Oral Once Rochelle Larue, MD         Objective: Vital signs in last 24 hours: BP 136/72   Pulse (!) 57   Intake/Output from previous day: No intake/output data recorded. Intake/Output this shift: @IOTHISSHIFT @   Physical Exam Vitals reviewed.  Constitutional:      Appearance: Normal appearance.   Neurological:     Mental Status: He is alert.     Lab Results:  No results found for this or any previous visit (from the past 24 hours).   BMET No results for input(s): NA, K, CL, CO2, GLUCOSE, BUN, CREATININE, CALCIUM  in the last 72 hours. PT/INR No results for input(s): LABPROT, INR in the last 72 hours. ABG No results for  input(s): PHART, HCO3 in the last 72 hours.  Invalid input(s): PCO2, PO2 UA has 11-20 RBC's  Labs in EPIC and CareEverywhere reviewed.   Recent Results (from the past 2160 hours)  Urinalysis, Routine w reflex microscopic     Status: Abnormal   Collection Time: 11/24/23 10:20 AM  Result Value Ref Range   Specific Gravity, UA 1.010 1.005 - 1.030   pH, UA 6.0 5.0 - 7.5   Color, UA Yellow Yellow   Appearance Ur Clear Clear   Leukocytes,UA Trace (A) Negative   Protein,UA 2+ (A) Negative/Trace   Glucose, UA Negative Negative   Ketones, UA Trace (A) Negative   RBC, UA 3+ (A) Negative   Bilirubin, UA Negative Negative   Urobilinogen, Ur 1.0 0.2 - 1.0 mg/dL   Nitrite, UA Negative Negative   Microscopic Examination See below:     Comment: Microscopic was indicated and was performed.  Microscopic Examination     Status: Abnormal   Collection Time: 11/24/23 10:20 AM   Urine  Result Value Ref Range   WBC, UA 6-10 (A) 0 - 5 /hpf   RBC, Urine >30 (A) 0 - 2 /hpf   Epithelial Cells (non renal) 0-10 0 - 10 /hpf   Casts Present (A) None seen /lpf   Cast Type Granular casts (A) N/A   Bacteria, UA Few (A) None seen/Few  Urinalysis, Routine w reflex microscopic -     Status: Abnormal   Collection Time: 12/01/23  4:02 PM  Result Value Ref Range  Color, Urine YELLOW YELLOW   APPearance CLEAR CLEAR   Specific Gravity, Urine 1.005 1.005 - 1.030   pH 7.0 5.0 - 8.0   Glucose, UA NEGATIVE NEGATIVE mg/dL   Hgb urine dipstick LARGE (A) NEGATIVE   Bilirubin Urine NEGATIVE NEGATIVE   Ketones, ur NEGATIVE NEGATIVE mg/dL   Protein, ur NEGATIVE NEGATIVE mg/dL   Nitrite NEGATIVE NEGATIVE   Leukocytes,Ua NEGATIVE NEGATIVE   RBC / HPF 11-20 0 - 5 RBC/hpf   WBC, UA 0-5 0 - 5 WBC/hpf   Bacteria, UA FEW (A) NONE SEEN   Squamous Epithelial / HPF 0-5 0 - 5 /HPF   Mucus PRESENT    Sperm, UA PRESENT     Comment: Performed at [facility], [address], [place], [place] [address]  CBC with  Differential     Status: Abnormal   Collection Time: 12/01/23  4:15 PM  Result Value Ref Range   WBC 12.1 (H) 4.0 - 10.5 K/uL   RBC 4.84 4.22 - 5.81 MIL/uL   Hemoglobin 15.5 13.0 - 17.0 g/dL   HCT 13.0 86.5 - 78.4 %   MCV 91.1 80.0 - 100.0 fL   MCH 32.0 26.0 - 34.0 pg   MCHC 35.1 30.0 - 36.0 g/dL   RDW 69.6 29.5 - 28.4 %   Platelets 202 150 - 400 K/uL   nRBC 0.0 0.0 - 0.2 %   Neutrophils Relative % 76 %   Neutro Abs 9.3 (H) 1.7 - 7.7 K/uL   Lymphocytes Relative 12 %   Lymphs Abs 1.4 0.7 - 4.0 K/uL   Monocytes Relative 10 %   Monocytes Absolute 1.2 (H) 0.1 - 1.0 K/uL   Eosinophils Relative 1 %   Eosinophils Absolute 0.1 0.0 - 0.5 K/uL   Basophils Relative 0 %   Basophils Absolute 0.0 0.0 - 0.1 K/uL   Immature Granulocytes 1 %   Abs Immature Granulocytes 0.07 0.00 - 0.07 K/uL    Comment: Performed at [facility], [address], [place], [place] [address]  Comprehensive metabolic panel     Status: Abnormal   Collection Time: 12/01/23  4:15 PM  Result Value Ref Range   Sodium 135 135 - 145 mmol/L   Potassium 3.2 (L) 3.5 - 5.1 mmol/L   Chloride 98 98 - 111 mmol/L   CO2 26 22 - 32 mmol/L   Glucose, Bld 107 (H) 70 - 99 mg/dL    Comment: Glucose reference range applies only to samples taken after fasting for at least 8 hours.   BUN 9 8 - 23 mg/dL   Creatinine, Ser 0.10 0.61 - 1.24 mg/dL   Calcium  8.8 (L) 8.9 - 10.3 mg/dL   Total Protein 7.2 6.5 - 8.1 g/dL   Albumin 4.2 3.5 - 5.0 g/dL   AST 17 15 - 41 U/L   ALT 24 0 - 44 U/L   Alkaline Phosphatase 77 38 - 126 U/L   Total Bilirubin 2.0 (H) 0.0 - 1.2 mg/dL   GFR, Estimated >27 >25 mL/min    Comment: (NOTE) Calculated using the CKD-EPI Creatinine Equation (2021)    Anion gap 11 5 - 15    Comment: Performed at [facility], [address] [place], [place] [address]  TSH + free T4     Status: None   Collection Time: 02/08/24 11:41 AM  Result Value Ref Range   TSH 0.787 0.450 - 4.500 uIU/mL   Free T4 1.42 0.82 - 1.77 ng/dL   Hemoglobin I3K  Status: Abnormal   Collection Time: 02/08/24 11:41 AM  Result Value Ref Range   Hgb A1c MFr Bld 6.3 (H) 4.8 - 5.6 %    Comment:          Prediabetes: 5.7 - 6.4          Diabetes: >6.4          Glycemic control for adults with diabetes: <7.0    Est. average glucose Bld gHb Est-mCnc 134 mg/dL  DGL8+VFIE     Status: None   Collection Time: 02/08/24 11:41 AM  Result Value Ref Range   Glucose 93 70 - 99 mg/dL   BUN 9 8 - 27 mg/dL   Creatinine, Ser 3.32 0.76 - 1.27 mg/dL   eGFR 98 >95 JO/ACZ/6.60   BUN/Creatinine Ratio 10 10 - 24   Sodium 140 134 - 144 mmol/L   Potassium 4.0 3.5 - 5.2 mmol/L   Chloride 100 96 - 106 mmol/L   CO2 24 20 - 29 mmol/L   Calcium  9.3 8.6 - 10.2 mg/dL  Lipid Profile     Status: Abnormal   Collection Time: 02/08/24 11:41 AM  Result Value Ref Range   Cholesterol, Total 82 (L) 100 - 199 mg/dL   Triglycerides 630 0 - 149 mg/dL   HDL 28 (L) >16 mg/dL   VLDL Cholesterol Cal 20 5 - 40 mg/dL   LDL Chol Calc (NIH) 34 0 - 99 mg/dL   Chol/HDL Ratio 2.9 0.0 - 5.0 ratio    Comment:                                   T. Chol/HDL Ratio                                             Men  Women                               1/2 Avg.Risk  3.4    3.3                                   Avg.Risk  5.0    4.4                                2X Avg.Risk  9.6    7.1                                3X Avg.Risk 23.4   11.0      Studies/Results: No results found. I have reviewed his CT from 09/21/23 DG Abd 1 View Result Date: 12/03/2023 CLINICAL DATA:  renal stone EXAM: ABDOMEN - 1 VIEW COMPARISON:  September 21, 2023 FINDINGS: Nonobstructive bowel gas pattern. Several radiodense foci project over bilateral kidneys measuring up to 4 mm on the RIGHT and 3 mm on the LEFT. No definitive radiopaque densities along the courses of the ureters. Vascular calcifications. Degenerative changes of the lumbar spine. IMPRESSION: Bilateral nephrolithiasis. Electronically Signed   By:  Clancy Crimes M.D.   On: 12/03/2023 15:04  CT Renal Stone Study Result Date: 12/01/2023 CLINICAL DATA:  Recurrent right flank pain. EXAM: CT ABDOMEN AND PELVIS WITHOUT CONTRAST TECHNIQUE: Multidetector CT imaging of the abdomen and pelvis was performed following the standard protocol without IV contrast. RADIATION DOSE REDUCTION: This exam was performed according to the departmental dose-optimization program which includes automated exposure control, adjustment of the mA and/or kV according to patient size and/or use of iterative reconstruction technique. COMPARISON:  September 21, 2023. FINDINGS: Lower chest: No acute abnormality. Hepatobiliary: No focal liver abnormality is seen. Status post cholecystectomy. No biliary dilatation. Pancreas: Unremarkable. No pancreatic ductal dilatation or surrounding inflammatory changes. Spleen: Normal in size without focal abnormality. Adrenals/Urinary Tract: Adrenal glands appear normal. Bilateral nonobstructive nephrolithiasis is noted. Large right renal cyst is again noted. No hydronephrosis or renal obstruction is noted. Urinary bladder is unremarkable. Stomach/Bowel: Small sliding-type hiatal hernia. There is no evidence of bowel obstruction or inflammation. The appendix appears normal. Mild diverticulosis is noted throughout the colon. Vascular/Lymphatic: Aortic atherosclerosis. No enlarged abdominal or pelvic lymph nodes. Reproductive: Prostate is unremarkable. Other: No ascites or hernia is noted. Musculoskeletal: No acute or significant osseous findings. IMPRESSION: Bilateral nonobstructive nephrolithiasis. Mild diverticulosis is noted throughout the colon without inflammation. Small sliding-type hiatal hernia. Aortic Atherosclerosis (ICD10-I70.0).1 Electronically Signed   By: Rosalene Colon M.D.   On: 12/01/2023 19:14   Procedure: He took the valium .  He was prepped with betadine and 2% lidocaine  jelly for a cystoscopy   The urethra was normal.   The  prostate was short with mild hyperplasia.   The bladder has mild trabeculation with no tumors, stones or inflammations.  The UO's were normal.   He was given Cipro 500mg  po.    Assessment/Plan  Microhematuria and recent gross hematuria.    Cystoscopy is negative.   He has bilateral renal stones which could be the cause particularly with the plavix  and eliquis .    I will just have him return in a year with a KUB.   Meds ordered this encounter  Medications   ciprofloxacin (CIPRO) tablet 500 mg     Orders Placed This Encounter  Procedures   DG Abd 1 View    Standing Status:   Future    Expected Date:   02/08/2025    Expiration Date:   02/15/2025    Reason for Exam (SYMPTOM  OR DIAGNOSIS REQUIRED):   renal stones    Preferred imaging location?:   [facility]    Radiology Contrast Protocol - do NOT remove file path:   [email or web address]   Urinalysis, Routine w reflex microscopic   Cystoscopy     Return in about 1 year (around 02/15/2025) for with KUB.  Any provider. .    CC: Dr. Channing Commander.      Mayrin Schmuck 02/16/2024

## 2024-03-13 ENCOUNTER — Other Ambulatory Visit: Payer: Self-pay | Admitting: Family Medicine

## 2024-03-14 ENCOUNTER — Telehealth: Admitting: Physician Assistant

## 2024-03-14 DIAGNOSIS — B9689 Other specified bacterial agents as the cause of diseases classified elsewhere: Secondary | ICD-10-CM | POA: Diagnosis not present

## 2024-03-14 DIAGNOSIS — J069 Acute upper respiratory infection, unspecified: Secondary | ICD-10-CM | POA: Diagnosis not present

## 2024-03-14 MED ORDER — BENZONATATE 100 MG PO CAPS: 100.0000 mg | ORAL_CAPSULE | Freq: Three times a day (TID) | ORAL | 0 refills | Status: AC | PRN

## 2024-03-14 MED ORDER — AMOXICILLIN-POT CLAVULANATE 875-125 MG PO TABS: 1.0000 | ORAL_TABLET | Freq: Two times a day (BID) | ORAL | 0 refills | Status: DC

## 2024-03-14 MED ORDER — ALBUTEROL SULFATE HFA 108 (90 BASE) MCG/ACT IN AERS: 1.0000 | INHALATION_SPRAY | Freq: Four times a day (QID) | RESPIRATORY_TRACT | 0 refills | Status: AC | PRN

## 2024-03-14 NOTE — Patient Instructions (Signed)
 Layman Chars, thank you for joining [name], PA-C for today's virtual visit.  While this provider is not your primary care provider (PCP), if your PCP is located in our provider database this encounter information will be shared with them immediately following your visit.   A [facility] MyChart account gives you access to today's visit and all your visits, tests, and labs performed at [facility]  click here if you don't have a [facility] MyChart account or go to [email or web address]  Consent: (Patient) William Mcmahon provided verbal consent for this virtual visit at the beginning of the encounter.  Current Medications:  Current Outpatient Medications:    albuterol  (VENTOLIN  HFA) 108 (90 Base) MCG/ACT inhaler, Inhale 1-2 puffs into the lungs every 6 (six) hours as needed., Disp: 8 g, Rfl: 0   amoxicillin -clavulanate (AUGMENTIN ) 875-125 MG tablet, Take 1 tablet by mouth 2 (two) times daily., Disp: 14 tablet, Rfl: 0   benzonatate  (TESSALON ) 100 MG capsule, Take 1-2 capsules (100-200 mg total) by mouth 3 (three) times daily as needed., Disp: 30 capsule, Rfl: 0   amLODipine  (NORVASC ) 5 MG tablet, Take 1.5 tablets (7.5 mg total) by mouth daily., Disp: 90 tablet, Rfl: 3   apixaban  (ELIQUIS ) 5 MG TABS tablet, Take 1 tablet (5 mg total) by mouth 2 (two) times daily., Disp: 180 tablet, Rfl: 3   atorvastatin  (LIPITOR ) 80 MG tablet, Take 1 tablet (80 mg total) by mouth daily., Disp: 90 tablet, Rfl: 3   carvedilol  (COREG ) 12.5 MG tablet, Take 1 tablet (12.5 mg total) by mouth 2 (two) times daily with a meal., Disp: 180 tablet, Rfl: 3   clopidogrel  (PLAVIX ) 75 MG tablet, Take 1 tablet (75 mg total) by mouth daily., Disp: 90 tablet, Rfl: 3   colestipol  (COLESTID ) 1 g tablet, Take 1 g by mouth 2 (two) times daily., Disp: , Rfl:    diazepam  (VALIUM ) 10 MG tablet, 1-2 tabs po 1 hour prior to procedure., Disp: 2 tablet, Rfl: 0   dicyclomine  (BENTYL ) 20 MG tablet, Take 20 mg by  mouth 3 (three) times daily before meals. Takes two hours before or five hours after other medications, Disp: , Rfl:    ezetimibe  (ZETIA ) 10 MG tablet, Take 1 tablet (10 mg total) by mouth daily., Disp: 90 tablet, Rfl: 3   isosorbide  mononitrate (IMDUR ) 60 MG 24 hr tablet, Take 1 tablet (60 mg total) by mouth daily., Disp: 90 tablet, Rfl: 3   levothyroxine  (SYNTHROID ) 125 MCG tablet, TAKE 1 TABLET BY MOUTH ONCE DAILY BEFORE BREAKFAST, Disp: 30 tablet, Rfl: 0   lipase/protease/amylase (CREON ) 36000 UNITS CPEP capsule, Take 72,000 Units by mouth 3 (three) times daily with meals., Disp: , Rfl:    magnesium  (MAGTAB) 84 MG ( ) TBCR SR tablet, Take 84 mg by mouth daily., Disp: , Rfl:    metFORMIN  (GLUCOPHAGE ) 500 MG tablet, Take 1 tablet (500 mg total) by mouth daily with breakfast., Disp: 90 tablet, Rfl: 1   nitroGLYCERIN  (NITROSTAT ) 0.4 MG SL tablet, Place 1 tablet (0.4 mg total) under the tongue every 5 (five) minutes as needed for chest pain., Disp: 25 tablet, Rfl: 2   potassium chloride  (KLOR-CON ) 10 MEQ tablet, TAKE 2 TABLETS BY MOUTH ON MONDAY, WEDNESDAY AND FRIDAY, THEN 1 TABLET ONCE DAILY ON TUESDAY, THURSDAY, SATURDAY AND SUNDAY, Disp: 135 tablet, Rfl: 3   Medications ordered in this encounter:  Meds ordered this encounter  Medications   amoxicillin -clavulanate (AUGMENTIN ) 875-125 MG tablet    Sig: Take 1 tablet by  mouth 2 (two) times daily.    Dispense:  14 tablet    Refill:  0    Supervising Provider:   LAMPTEY, PHILIP O [[id number]]   benzonatate  (TESSALON ) 100 MG capsule    Sig: Take 1-2 capsules (100-200 mg total) by mouth 3 (three) times daily as needed.    Dispense:  30 capsule    Refill:  0    Supervising Provider:   LAMPTEY, PHILIP O [[id number]]   albuterol  (VENTOLIN  HFA) 108 (90 Base) MCG/ACT inhaler    Sig: Inhale 1-2 puffs into the lungs every 6 (six) hours as needed.    Dispense:  8 g    Refill:  0    Supervising Provider:   BLAISE ALEENE KIDD [[id number]]     *If you need  refills on other medications prior to your next appointment, please contact your pharmacy*  Follow-Up: Call back or seek an in-person evaluation if the symptoms worsen or if the condition fails to improve as anticipated.   Virtual Care [phone]  Other Instructions Upper Respiratory Infection, Adult An upper respiratory infection (URI) is a common viral infection of the nose, throat, and upper air passages that lead to the lungs. The most common type of URI is the common cold. URIs usually get better on their own, without medical treatment. What are the causes? A URI is caused by a virus. You may catch a virus by: Breathing in droplets from an infected person's cough or sneeze. Touching something that has been exposed to the virus (is contaminated) and then touching your mouth, nose, or eyes. What increases the risk? You are more likely to get a URI if: You are very young or very old. You have close contact with others, such as at work, school, or a health care facility. You smoke. You have long-term (chronic) heart or lung disease. You have a weakened disease-fighting system (immune system). You have nasal allergies or asthma. You are experiencing a lot of stress. You have poor nutrition. What are the signs or symptoms? A URI usually involves some of the following symptoms: Runny or stuffy (congested) nose. Cough. Sneezing. Sore throat. Headache. Fatigue. Fever. Loss of appetite. Pain in your forehead, behind your eyes, and over your cheekbones (sinus pain). Muscle aches. Redness or irritation of the eyes. Pressure in the ears or face. How is this diagnosed? This condition may be diagnosed based on your medical history and symptoms, and a physical exam. Your health care provider may use a swab to take a mucus sample from your nose (nasal swab). This sample can be tested to determine what virus is causing the illness. How is this treated? URIs usually get  better on their own within 7-10 days. Medicines cannot cure URIs, but your health care provider may recommend certain medicines to help relieve symptoms, such as: Over-the-counter cold medicines. Cough suppressants. Coughing is a type of defense against infection that helps to clear the respiratory system, so take these medicines only as recommended by your health care provider. Fever-reducing medicines. Follow these instructions at home: Activity Rest as needed. If you have a fever, stay home from work or school until your fever is gone or until your health care provider says your URI cannot spread to other people (is no longer contagious). Your health care provider may have you wear a face mask to prevent your infection from spreading. Relieving symptoms Gargle with a mixture of salt and water 3-4 times a day or as needed. To make  salt water, completely dissolve -1 tsp (3-6 g) of salt in 1 cup (237 mL) of warm water. Use a cool-mist humidifier to add moisture to the air. This can help you breathe more easily. Eating and drinking  Drink enough fluid to keep your urine pale yellow. Eat soups and other clear broths. General instructions  Take over-the-counter and prescription medicines only as told by your health care provider. These include cold medicines, fever reducers, and cough suppressants. Do not use any products that contain nicotine or tobacco. These products include cigarettes, chewing tobacco, and vaping devices, such as e-cigarettes. If you need help quitting, ask your health care provider. Stay away from secondhand smoke. Stay up to date on all immunizations, including the yearly (annual) flu vaccine. Keep all follow-up visits. This is important. How to prevent the spread of infection to others URIs can be contagious. To prevent the infection from spreading: Wash your hands with soap and water for at least 20 seconds. If soap and water are not available, use hand  sanitizer. Avoid touching your mouth, face, eyes, or nose. Cough or sneeze into a tissue or your sleeve or elbow instead of into your hand or into the air.  Contact a health care provider if: You are getting worse instead of better. You have a fever or chills. Your mucus is brown or red. You have yellow or brown discharge coming from your nose. You have pain in your face, especially when you bend forward. You have swollen neck glands. You have pain while swallowing. You have white areas in the back of your throat. Get help right away if: You have shortness of breath that gets worse. You have severe or persistent: Headache. Ear pain. Sinus pain. Chest pain. You have chronic lung disease along with any of the following: Making high-pitched whistling sounds when you breathe, most often when you breathe out (wheezing). Prolonged cough (more than 14 days). Coughing up blood. A change in your usual mucus. You have a stiff neck. You have changes in your: Vision. Hearing. Thinking. Mood. These symptoms may be an emergency. Get help right away. Call 911. Do not wait to see if the symptoms will go away. Do not drive yourself to the hospital. Summary An upper respiratory infection (URI) is a common infection of the nose, throat, and upper air passages that lead to the lungs. A URI is caused by a virus. URIs usually get better on their own within 7-10 days. Medicines cannot cure URIs, but your health care provider may recommend certain medicines to help relieve symptoms. This information is not intended to replace advice given to you by your health care provider. Make sure you discuss any questions you have with your health care provider. Document Revised: 03/18/2021 Document Reviewed: 03/18/2021 Elsevier Patient Education  2024 Elsevier Inc.   If you have been instructed to have an in-person evaluation today at a local Urgent Care facility, please use the link below. It will take  you to a list of all of our available [facility], including address, phone number and hours of operation. Please do not delay care.  [facility]  If you or a family member do not have a primary care provider, use the link below to schedule a visit and establish care. When you choose a [facility] primary care physician or advanced practice provider, you gain a long-term partner in health. Find a Primary Care Provider  Learn more about [facility]'s in-office and virtual care  options: [facility] - Get Care Now

## 2024-03-14 NOTE — Progress Notes (Signed)
 Virtual Visit Consent   [name], you are scheduled for a virtual visit with a [facility] provider today. Just as with appointments in the office, your consent must be obtained to participate. Your consent will be active for this visit and any virtual visit you may have with one of our providers in the next 365 days. If you have a MyChart account, a copy of this consent can be sent to you electronically.  As this is a virtual visit, video technology does not allow for your provider to perform a traditional examination. This may limit your provider's ability to fully assess your condition. If your provider identifies any concerns that need to be evaluated in person or the need to arrange testing (such as labs, EKG, etc.), we will make arrangements to do so. Although advances in technology are sophisticated, we cannot ensure that it will always work on either your end or our end. If the connection with a video visit is poor, the visit may have to be switched to a telephone visit. With either a video or telephone visit, we are not always able to ensure that we have a secure connection.  By engaging in this virtual visit, you consent to the provision of healthcare and authorize for your insurance to be billed (if applicable) for the services provided during this visit. Depending on your insurance coverage, you may receive a charge related to this service.  I need to obtain your verbal consent now. Are you willing to proceed with your visit today? William Mcmahon has provided verbal consent on 03/14/2024 for a virtual visit (video or telephone). William CHRISTELLA Dickinson, PA-C  Date: 03/14/2024 4:52 PM   Virtual Visit via Video Note   IDelon CHRISTELLA Mcmahon, connected with  Zohair Epp  ([id number], [birth year]) on 03/14/24 at  4:45 PM EDT by a video-enabled telemedicine application and verified that I am speaking with the correct person using two identifiers.  Location: Patient: Virtual Visit Location  Patient: Home Provider: Virtual Visit Location Provider: Home Office   I discussed the limitations of evaluation and management by telemedicine and the availability of in person appointments. The patient expressed understanding and agreed to proceed.    History of Present Illness: William Mcmahon is a 63 y.o. who identifies as a male who was assigned male at birth, and is being seen today for URI symptoms.  HPI: URI  This is a new problem. The current episode started in the past 7 days. The problem has been gradually worsening. There has been no fever. Associated symptoms include congestion, coughing, headaches, nausea, rhinorrhea (and post nasal drainage), sinus pain (right side, behind right eye), a sore throat, vomiting (mucus) and wheezing. Pertinent negatives include no chest pain, diarrhea, ear pain or plugged ear sensation. Treatments tried: triamcinolone nasal spray. The treatment provided no relief.     Problems:  Patient Active Problem List   Diagnosis Date Noted   Acute non-recurrent frontal sinusitis 10/12/2023   Bilateral impacted cerumen 10/12/2023   Renal cyst 05/05/2023   Renal stones 05/05/2023   Type 2 diabetes mellitus without complication, without long-term current use of insulin  (HCC) 05/05/2023   Idiopathic chronic pancreatitis (HCC) 02/01/2023   Hypomagnesemia 01/25/2023   Jaw pain 01/25/2023   Generalized weakness 01/23/2023   AKI (acute kidney injury) (HCC) 01/23/2023   Acute diarrhea 01/23/2023   Dehydration 01/23/2023   Other fatigue 01/23/2023   Dyspnea 01/23/2023   Leukocytosis 03/31/2021   Hypophosphatemia 03/31/2021   Hypothyroidism 03/31/2021  Hyperglycemia 03/31/2021   Obesity (BMI 30.0-34.9) 03/31/2021   Atypical chest pain 03/30/2021   PAF (paroxysmal atrial fibrillation) (HCC) 12/01/2020   Dysphagia 12/01/2020   Demand ischemia (HCC) 09/22/2020   Atrial fibrillation with RVR (HCC) 09/21/2020   History of thyroid  cancer 03/31/2020   Educated  about COVID-19 virus infection 02/07/2019   Hypercholesteremia 01/15/2019   Essential hypertension 11/29/2018   Palpitations 11/29/2018   SOB (shortness of breath) 11/29/2018   CAD in native artery, with prior stents, and instent restenosis of small vessel, medical therapy, LAD and RCA stents are patent 09/12/2018   HLD (hyperlipidemia) 09/12/2018   DM (diabetes mellitus), type 2 (HCC) diet controlled 09/12/2018   Hypokalemia 09/12/2018   Syncope- may be due to diuretics 09/12/2018   Unstable angina (HCC) 09/10/2018   Bilateral numbness and tingling of arms and legs 10/21/2017   Unintentional weight loss 10/21/2017   Postoperative hypothyroidism 06/27/2017   Transaminitis 06/27/2017   Coronary artery disease 06/27/2017    Allergies:  Allergies  Allergen Reactions   Naproxen Swelling   Medications:  Current Outpatient Medications:    albuterol  (VENTOLIN  HFA) 108 (90 Base) MCG/ACT inhaler, Inhale 1-2 puffs into the lungs every 6 (six) hours as needed., Disp: 8 g, Rfl: 0   amoxicillin -clavulanate (AUGMENTIN ) 875-125 MG tablet, Take 1 tablet by mouth 2 (two) times daily., Disp: 14 tablet, Rfl: 0   benzonatate  (TESSALON ) 100 MG capsule, Take 1-2 capsules (100-200 mg total) by mouth 3 (three) times daily as needed., Disp: 30 capsule, Rfl: 0   amLODipine  (NORVASC ) 5 MG tablet, Take 1.5 tablets (7.5 mg total) by mouth daily., Disp: 90 tablet, Rfl: 3   apixaban  (ELIQUIS ) 5 MG TABS tablet, Take 1 tablet (5 mg total) by mouth 2 (two) times daily., Disp: 180 tablet, Rfl: 3   atorvastatin  (LIPITOR ) 80 MG tablet, Take 1 tablet (80 mg total) by mouth daily., Disp: 90 tablet, Rfl: 3   carvedilol  (COREG ) 12.5 MG tablet, Take 1 tablet (12.5 mg total) by mouth 2 (two) times daily with a meal., Disp: 180 tablet, Rfl: 3   clopidogrel  (PLAVIX ) 75 MG tablet, Take 1 tablet (75 mg total) by mouth daily., Disp: 90 tablet, Rfl: 3   colestipol  (COLESTID ) 1 g tablet, Take 1 g by mouth 2 (two) times daily., Disp:  , Rfl:    diazepam  (VALIUM ) 10 MG tablet, 1-2 tabs po 1 hour prior to procedure., Disp: 2 tablet, Rfl: 0   dicyclomine  (BENTYL ) 20 MG tablet, Take 20 mg by mouth 3 (three) times daily before meals. Takes two hours before or five hours after other medications, Disp: , Rfl:    ezetimibe  (ZETIA ) 10 MG tablet, Take 1 tablet (10 mg total) by mouth daily., Disp: 90 tablet, Rfl: 3   isosorbide  mononitrate (IMDUR ) 60 MG 24 hr tablet, Take 1 tablet (60 mg total) by mouth daily., Disp: 90 tablet, Rfl: 3   levothyroxine  (SYNTHROID ) 125 MCG tablet, TAKE 1 TABLET BY MOUTH ONCE DAILY BEFORE BREAKFAST, Disp: 30 tablet, Rfl: 0   lipase/protease/amylase (CREON ) 36000 UNITS CPEP capsule, Take 72,000 Units by mouth 3 (three) times daily with meals., Disp: , Rfl:    magnesium  (MAGTAB) 84 MG ( ) TBCR SR tablet, Take 84 mg by mouth daily., Disp: , Rfl:    metFORMIN  (GLUCOPHAGE ) 500 MG tablet, Take 1 tablet (500 mg total) by mouth daily with breakfast., Disp: 90 tablet, Rfl: 1   nitroGLYCERIN  (NITROSTAT ) 0.4 MG SL tablet, Place 1 tablet (0.4 mg total) under the tongue every  5 (five) minutes as needed for chest pain., Disp: 25 tablet, Rfl: 2   potassium chloride  (KLOR-CON ) 10 MEQ tablet, TAKE 2 TABLETS BY MOUTH ON MONDAY, WEDNESDAY AND FRIDAY, THEN 1 TABLET ONCE DAILY ON TUESDAY, THURSDAY, SATURDAY AND SUNDAY, Disp: 135 tablet, Rfl: 3  Observations/Objective: Patient is well-developed, well-nourished in no acute distress.  Resting comfortably at home.  Head is normocephalic, atraumatic.  No labored breathing.  Speech is clear and coherent with logical content.  Patient is alert and oriented at baseline.    Assessment and Plan: 1. Bacterial upper respiratory infection (Primary) - amoxicillin -clavulanate (AUGMENTIN ) 875-125 MG tablet; Take 1 tablet by mouth 2 (two) times daily.  Dispense: 14 tablet; Refill: 0 - benzonatate  (TESSALON ) 100 MG capsule; Take 1-2 capsules (100-200 mg total) by mouth 3 (three) times  daily as needed.  Dispense: 30 capsule; Refill: 0 - albuterol  (VENTOLIN  HFA) 108 (90 Base) MCG/ACT inhaler; Inhale 1-2 puffs into the lungs every 6 (six) hours as needed.  Dispense: 8 g; Refill: 0  - Worsening over a week despite OTC medications - Will treat with Augmentin , Albuterol , and tessalon  perles - Can add Mucinex (PLAIN) - Can continue the Triamcinolone nasal spray - Push fluids.  - Rest.  - Steam and humidifier can help - Seek in person evaluation if worsening or symptoms fail to improve    Follow Up Instructions: I discussed the assessment and treatment plan with the patient. The patient was provided an opportunity to ask questions and all were answered. The patient agreed with the plan and demonstrated an understanding of the instructions.  A copy of instructions were sent to the patient via MyChart unless otherwise noted below.    The patient was advised to call back or seek an in-person evaluation if the symptoms worsen or if the condition fails to improve as anticipated.    William CHRISTELLA Dickinson, PA-C

## 2024-03-27 ENCOUNTER — Telehealth: Admitting: Physician Assistant

## 2024-03-27 DIAGNOSIS — J069 Acute upper respiratory infection, unspecified: Secondary | ICD-10-CM | POA: Diagnosis not present

## 2024-03-27 DIAGNOSIS — B9689 Other specified bacterial agents as the cause of diseases classified elsewhere: Secondary | ICD-10-CM

## 2024-03-27 MED ORDER — ONDANSETRON 4 MG PO TBDP: 4.0000 mg | ORAL_TABLET | Freq: Three times a day (TID) | ORAL | 0 refills | Status: AC | PRN

## 2024-03-27 MED ORDER — PREDNISONE 10 MG (21) PO TBPK: ORAL_TABLET | ORAL | 0 refills | Status: DC

## 2024-03-27 MED ORDER — DOXYCYCLINE HYCLATE 100 MG PO TABS: 100.0000 mg | ORAL_TABLET | Freq: Two times a day (BID) | ORAL | 0 refills | Status: DC

## 2024-03-27 NOTE — Progress Notes (Signed)
 Virtual Visit Consent   William Mcmahon, you are scheduled for a virtual visit with a [facility] provider today. Just as with appointments in the office, your consent must be obtained to participate. Your consent will be active for this visit and any virtual visit you may have with one of our providers in the next 365 days. If you have a MyChart account, a copy of this consent can be sent to you electronically.  As this is a virtual visit, video technology does not allow for your provider to perform a traditional examination. This may limit your provider's ability to fully assess your condition. If your provider identifies any concerns that need to be evaluated in person or the need to arrange testing (such as labs, EKG, etc.), we will make arrangements to do so. Although advances in technology are sophisticated, we cannot ensure that it will always work on either your end or our end. If the connection with a video visit is poor, the visit may have to be switched to a telephone visit. With either a video or telephone visit, we are not always able to ensure that we have a secure connection.  By engaging in this virtual visit, you consent to the provision of healthcare and authorize for your insurance to be billed (if applicable) for the services provided during this visit. Depending on your insurance coverage, you may receive a charge related to this service.  I need to obtain your verbal consent now. Are you willing to proceed with your visit today? William Mcmahon has provided verbal consent on 03/27/2024 for a virtual visit (video or telephone). William CHRISTELLA Dickinson, PA-C  Date: 03/27/2024 1:34 PM   Virtual Visit via Video Note   IDelon CHRISTELLA Mcmahon, connected with  William Mcmahon  ([id number], 1960-08-31) on 03/27/24 at  1:30 PM EDT by a video-enabled telemedicine application and verified that I am speaking with the correct person using two identifiers.  Location: Patient: Virtual Visit Location  Patient: Home Provider: Virtual Visit Location Provider: Home Office   I discussed the limitations of evaluation and management by telemedicine and the availability of in person appointments. The patient expressed understanding and agreed to proceed.    History of Present Illness: William Mcmahon is a 63 y.o. who identifies as a male who was assigned male at birth, and is being seen today for worsening URI.  HPI: Cough This is a recurrent problem. The current episode started 1 to 4 weeks ago (Seen Virtually on 03/14/24 for same issue and given Augmentin  x 7 days with Albuterol  inhaler and Tessalon  perles; reports no improvement with symptoms). The problem has been gradually worsening. The problem occurs constantly. The cough is Productive of sputum and productive of purulent sputum. Associated symptoms include chest pain, headaches, nasal congestion, postnasal drip, rhinorrhea and wheezing (not improved with albuterol ). Pertinent negatives include no chills, fever, myalgias, sore throat or shortness of breath. Treatments tried: Augmentin , Mucinex, Afrin, Tessalon  perles.     Problems:  Patient Active Problem List   Diagnosis Date Noted   Acute non-recurrent frontal sinusitis 10/12/2023   Bilateral impacted cerumen 10/12/2023   Renal cyst 05/05/2023   Renal stones 05/05/2023   Type 2 diabetes mellitus without complication, without long-term current use of insulin  (HCC) 05/05/2023   Idiopathic chronic pancreatitis (HCC) 02/01/2023   Hypomagnesemia 01/25/2023   Jaw pain 01/25/2023   Generalized weakness 01/23/2023   AKI (acute kidney injury) (HCC) 01/23/2023   Acute diarrhea 01/23/2023   Dehydration 01/23/2023  Other fatigue 01/23/2023   Dyspnea 01/23/2023   Leukocytosis 03/31/2021   Hypophosphatemia 03/31/2021   Hypothyroidism 03/31/2021   Hyperglycemia 03/31/2021   Obesity (BMI 30.0-34.9) 03/31/2021   Atypical chest pain 03/30/2021   PAF (paroxysmal atrial fibrillation) (HCC)  12/01/2020   Dysphagia 12/01/2020   Demand ischemia (HCC) 09/22/2020   Atrial fibrillation with RVR (HCC) 09/21/2020   History of thyroid  cancer 03/31/2020   Educated about COVID-19 virus infection 02/07/2019   Hypercholesteremia 01/15/2019   Essential hypertension 11/29/2018   Palpitations 11/29/2018   SOB (shortness of breath) 11/29/2018   CAD in native artery, with prior stents, and instent restenosis of small vessel, medical therapy, LAD and RCA stents are patent 09/12/2018   HLD (hyperlipidemia) 09/12/2018   DM (diabetes mellitus), type 2 (HCC) diet controlled 09/12/2018   Hypokalemia 09/12/2018   Syncope- may be due to diuretics 09/12/2018   Unstable angina (HCC) 09/10/2018   Bilateral numbness and tingling of arms and legs 10/21/2017   Unintentional weight loss 10/21/2017   Postoperative hypothyroidism 06/27/2017   Transaminitis 06/27/2017   Coronary artery disease 06/27/2017    Allergies:  Allergies  Allergen Reactions   Naproxen Swelling   Medications:  Current Outpatient Medications:    doxycycline  (VIBRA -TABS) 100 MG tablet, Take 1 tablet (100 mg total) by mouth 2 (two) times daily., Disp: 20 tablet, Rfl: 0   ondansetron  (ZOFRAN -ODT) 4 MG disintegrating tablet, Take 1 tablet (4 mg total) by mouth every 8 (eight) hours as needed., Disp: 20 tablet, Rfl: 0   predniSONE  (STERAPRED UNI-PAK 21 TAB) 10 MG (21) TBPK tablet, 6 day taper; take as directed on package instructions, Disp: 21 tablet, Rfl: 0   albuterol  (VENTOLIN  HFA) 108 (90 Base) MCG/ACT inhaler, Inhale 1-2 puffs into the lungs every 6 (six) hours as needed., Disp: 8 g, Rfl: 0   amLODipine  (NORVASC ) 5 MG tablet, Take 1.5 tablets (7.5 mg total) by mouth daily., Disp: 90 tablet, Rfl: 3   amoxicillin -clavulanate (AUGMENTIN ) 875-125 MG tablet, Take 1 tablet by mouth 2 (two) times daily., Disp: 14 tablet, Rfl: 0   apixaban  (ELIQUIS ) 5 MG TABS tablet, Take 1 tablet (5 mg total) by mouth 2 (two) times daily., Disp: 180  tablet, Rfl: 3   atorvastatin  (LIPITOR ) 80 MG tablet, Take 1 tablet (80 mg total) by mouth daily., Disp: 90 tablet, Rfl: 3   benzonatate  (TESSALON ) 100 MG capsule, Take 1-2 capsules (100-200 mg total) by mouth 3 (three) times daily as needed., Disp: 30 capsule, Rfl: 0   carvedilol  (COREG ) 12.5 MG tablet, Take 1 tablet (12.5 mg total) by mouth 2 (two) times daily with a meal., Disp: 180 tablet, Rfl: 3   clopidogrel  (PLAVIX ) 75 MG tablet, Take 1 tablet (75 mg total) by mouth daily., Disp: 90 tablet, Rfl: 3   colestipol  (COLESTID ) 1 g tablet, Take 1 g by mouth 2 (two) times daily., Disp: , Rfl:    diazepam  (VALIUM ) 10 MG tablet, 1-2 tabs po 1 hour prior to procedure., Disp: 2 tablet, Rfl: 0   dicyclomine  (BENTYL ) 20 MG tablet, Take 20 mg by mouth 3 (three) times daily before meals. Takes two hours before or five hours after other medications, Disp: , Rfl:    ezetimibe  (ZETIA ) 10 MG tablet, Take 1 tablet (10 mg total) by mouth daily., Disp: 90 tablet, Rfl: 3   isosorbide  mononitrate (IMDUR ) 60 MG 24 hr tablet, Take 1 tablet (60 mg total) by mouth daily., Disp: 90 tablet, Rfl: 3   levothyroxine  (SYNTHROID ) 125 MCG tablet,  TAKE 1 TABLET BY MOUTH ONCE DAILY BEFORE BREAKFAST, Disp: 30 tablet, Rfl: 0   lipase/protease/amylase (CREON ) 36000 UNITS CPEP capsule, Take 72,000 Units by mouth 3 (three) times daily with meals., Disp: , Rfl:    magnesium  (MAGTAB) 84 MG ( ) TBCR SR tablet, Take 84 mg by mouth daily., Disp: , Rfl:    metFORMIN  (GLUCOPHAGE ) 500 MG tablet, Take 1 tablet (500 mg total) by mouth daily with breakfast., Disp: 90 tablet, Rfl: 1   nitroGLYCERIN  (NITROSTAT ) 0.4 MG SL tablet, Place 1 tablet (0.4 mg total) under the tongue every 5 (five) minutes as needed for chest pain., Disp: 25 tablet, Rfl: 2   potassium chloride  (KLOR-CON ) 10 MEQ tablet, TAKE 2 TABLETS BY MOUTH ON MONDAY, WEDNESDAY AND FRIDAY, THEN 1 TABLET ONCE DAILY ON TUESDAY, THURSDAY, SATURDAY AND SUNDAY, Disp: 135 tablet, Rfl:  3  Observations/Objective: Patient is well-developed, well-nourished in no acute distress.  Resting comfortably at home.  Head is normocephalic, atraumatic.  No labored breathing.  Speech is clear and coherent with logical content.  Patient is alert and oriented at baseline.    Assessment and Plan: 1. Bacterial upper respiratory infection (Primary) - doxycycline  (VIBRA -TABS) 100 MG tablet; Take 1 tablet (100 mg total) by mouth 2 (two) times daily.  Dispense: 20 tablet; Refill: 0 - predniSONE  (STERAPRED UNI-PAK 21 TAB) 10 MG (21) TBPK tablet; 6 day taper; take as directed on package instructions  Dispense: 21 tablet; Refill: 0 - ondansetron  (ZOFRAN -ODT) 4 MG disintegrating tablet; Take 1 tablet (4 mg total) by mouth every 8 (eight) hours as needed.  Dispense: 20 tablet; Refill: 0  - Worsening over a week despite OTC medications and antibiotics - Will treat with Doxycycline  - Add Prednisone , monitor glucose closely-last A1c was down to 6.3 on 02/08/24 - Zofran  for nausea - Can continue Mucinex  - Push fluids.  - Rest.  - Steam and humidifier can help - Seek in person evaluation if worsening or symptoms fail to improve    Follow Up Instructions: I discussed the assessment and treatment plan with the patient. The patient was provided an opportunity to ask questions and all were answered. The patient agreed with the plan and demonstrated an understanding of the instructions.  A copy of instructions were sent to the patient via MyChart unless otherwise noted below.    The patient was advised to call back or seek an in-person evaluation if the symptoms worsen or if the condition fails to improve as anticipated.    William CHRISTELLA Dickinson, PA-C

## 2024-03-27 NOTE — Patient Instructions (Signed)
 Layman Chars, thank you for joining [name], PA-C for today's virtual visit.  While this provider is not your primary care provider (PCP), if your PCP is located in our provider database this encounter information will be shared with them immediately following your visit.   A [facility] MyChart account gives you access to today's visit and all your visits, tests, and labs performed at [facility]  click here if you don't have a [facility] MyChart account or go to [email or web address]  Consent: (Patient) William Mcmahon provided verbal consent for this virtual visit at the beginning of the encounter.  Current Medications:  Current Outpatient Medications:    doxycycline  (VIBRA -TABS) 100 MG tablet, Take 1 tablet (100 mg total) by mouth 2 (two) times daily., Disp: 20 tablet, Rfl: 0   ondansetron  (ZOFRAN -ODT) 4 MG disintegrating tablet, Take 1 tablet (4 mg total) by mouth every 8 (eight) hours as needed., Disp: 20 tablet, Rfl: 0   predniSONE  (STERAPRED UNI-PAK 21 TAB) 10 MG (21) TBPK tablet, 6 day taper; take as directed on package instructions, Disp: 21 tablet, Rfl: 0   albuterol  (VENTOLIN  HFA) 108 (90 Base) MCG/ACT inhaler, Inhale 1-2 puffs into the lungs every 6 (six) hours as needed., Disp: 8 g, Rfl: 0   amLODipine  (NORVASC ) 5 MG tablet, Take 1.5 tablets (7.5 mg total) by mouth daily., Disp: 90 tablet, Rfl: 3   amoxicillin -clavulanate (AUGMENTIN ) 875-125 MG tablet, Take 1 tablet by mouth 2 (two) times daily., Disp: 14 tablet, Rfl: 0   apixaban  (ELIQUIS ) 5 MG TABS tablet, Take 1 tablet (5 mg total) by mouth 2 (two) times daily., Disp: 180 tablet, Rfl: 3   atorvastatin  (LIPITOR ) 80 MG tablet, Take 1 tablet (80 mg total) by mouth daily., Disp: 90 tablet, Rfl: 3   benzonatate  (TESSALON ) 100 MG capsule, Take 1-2 capsules (100-200 mg total) by mouth 3 (three) times daily as needed., Disp: 30 capsule, Rfl: 0   carvedilol  (COREG ) 12.5 MG tablet, Take 1 tablet (12.5 mg  total) by mouth 2 (two) times daily with a meal., Disp: 180 tablet, Rfl: 3   clopidogrel  (PLAVIX ) 75 MG tablet, Take 1 tablet (75 mg total) by mouth daily., Disp: 90 tablet, Rfl: 3   colestipol  (COLESTID ) 1 g tablet, Take 1 g by mouth 2 (two) times daily., Disp: , Rfl:    diazepam  (VALIUM ) 10 MG tablet, 1-2 tabs po 1 hour prior to procedure., Disp: 2 tablet, Rfl: 0   dicyclomine  (BENTYL ) 20 MG tablet, Take 20 mg by mouth 3 (three) times daily before meals. Takes two hours before or five hours after other medications, Disp: , Rfl:    ezetimibe  (ZETIA ) 10 MG tablet, Take 1 tablet (10 mg total) by mouth daily., Disp: 90 tablet, Rfl: 3   isosorbide  mononitrate (IMDUR ) 60 MG 24 hr tablet, Take 1 tablet (60 mg total) by mouth daily., Disp: 90 tablet, Rfl: 3   levothyroxine  (SYNTHROID ) 125 MCG tablet, TAKE 1 TABLET BY MOUTH ONCE DAILY BEFORE BREAKFAST, Disp: 30 tablet, Rfl: 0   lipase/protease/amylase (CREON ) 36000 UNITS CPEP capsule, Take 72,000 Units by mouth 3 (three) times daily with meals., Disp: , Rfl:    magnesium  (MAGTAB) 84 MG ( ) TBCR SR tablet, Take 84 mg by mouth daily., Disp: , Rfl:    metFORMIN  (GLUCOPHAGE ) 500 MG tablet, Take 1 tablet (500 mg total) by mouth daily with breakfast., Disp: 90 tablet, Rfl: 1   nitroGLYCERIN  (NITROSTAT ) 0.4 MG SL tablet, Place 1 tablet (0.4 mg total) under the tongue  every 5 (five) minutes as needed for chest pain., Disp: 25 tablet, Rfl: 2   potassium chloride  (KLOR-CON ) 10 MEQ tablet, TAKE 2 TABLETS BY MOUTH ON MONDAY, WEDNESDAY AND FRIDAY, THEN 1 TABLET ONCE DAILY ON TUESDAY, THURSDAY, SATURDAY AND SUNDAY, Disp: 135 tablet, Rfl: 3   Medications ordered in this encounter:  Meds ordered this encounter  Medications   doxycycline  (VIBRA -TABS) 100 MG tablet    Sig: Take 1 tablet (100 mg total) by mouth 2 (two) times daily.    Dispense:  20 tablet    Refill:  0    Supervising Provider:   BLAISE ALEENE KIDD [[id number]]   predniSONE  (STERAPRED UNI-PAK 21 TAB) 10  MG (21) TBPK tablet    Sig: 6 day taper; take as directed on package instructions    Dispense:  21 tablet    Refill:  0    Supervising Provider:   LAMPTEY, PHILIP O [[id number]]   ondansetron  (ZOFRAN -ODT) 4 MG disintegrating tablet    Sig: Take 1 tablet (4 mg total) by mouth every 8 (eight) hours as needed.    Dispense:  20 tablet    Refill:  0    Supervising Provider:   LAMPTEY, PHILIP O [[id number]]     *If you need refills on other medications prior to your next appointment, please contact your pharmacy*  Follow-Up: Call back or seek an in-person evaluation if the symptoms worsen or if the condition fails to improve as anticipated.  [facility] [phone]  Other Instructions Upper Respiratory Infection, Adult An upper respiratory infection (URI) is a common viral infection of the nose, throat, and upper air passages that lead to the lungs. The most common type of URI is the common cold. URIs usually get better on their own, without medical treatment. What are the causes? A URI is caused by a virus. You may catch a virus by: Breathing in droplets from an infected person's cough or sneeze. Touching something that has been exposed to the virus (is contaminated) and then touching your mouth, nose, or eyes. What increases the risk? You are more likely to get a URI if: You are very young or very old. You have close contact with others, such as at work, school, or a health care facility. You smoke. You have long-term (chronic) heart or lung disease. You have a weakened disease-fighting system (immune system). You have nasal allergies or asthma. You are experiencing a lot of stress. You have poor nutrition. What are the signs or symptoms? A URI usually involves some of the following symptoms: Runny or stuffy (congested) nose. Cough. Sneezing. Sore throat. Headache. Fatigue. Fever. Loss of appetite. Pain in your forehead, behind your eyes, and over your cheekbones  (sinus pain). Muscle aches. Redness or irritation of the eyes. Pressure in the ears or face. How is this diagnosed? This condition may be diagnosed based on your medical history and symptoms, and a physical exam. Your health care provider may use a swab to take a mucus sample from your nose (nasal swab). This sample can be tested to determine what virus is causing the illness. How is this treated? URIs usually get better on their own within 7-10 days. Medicines cannot cure URIs, but your health care provider may recommend certain medicines to help relieve symptoms, such as: Over-the-counter cold medicines. Cough suppressants. Coughing is a type of defense against infection that helps to clear the respiratory system, so take these medicines only as recommended by your health care provider. Fever-reducing  medicines. Follow these instructions at home: Activity Rest as needed. If you have a fever, stay home from work or school until your fever is gone or until your health care provider says your URI cannot spread to other people (is no longer contagious). Your health care provider may have you wear a face mask to prevent your infection from spreading. Relieving symptoms Gargle with a mixture of salt and water 3-4 times a day or as needed. To make salt water, completely dissolve -1 tsp (3-6 g) of salt in 1 cup (237 mL) of warm water. Use a cool-mist humidifier to add moisture to the air. This can help you breathe more easily. Eating and drinking  Drink enough fluid to keep your urine pale yellow. Eat soups and other clear broths. General instructions  Take over-the-counter and prescription medicines only as told by your health care provider. These include cold medicines, fever reducers, and cough suppressants. Do not use any products that contain nicotine or tobacco. These products include cigarettes, chewing tobacco, and vaping devices, such as e-cigarettes. If you need help quitting, ask your  health care provider. Stay away from secondhand smoke. Stay up to date on all immunizations, including the yearly (annual) flu vaccine. Keep all follow-up visits. This is important. How to prevent the spread of infection to others URIs can be contagious. To prevent the infection from spreading: Wash your hands with soap and water for at least 20 seconds. If soap and water are not available, use hand sanitizer. Avoid touching your mouth, face, eyes, or nose. Cough or sneeze into a tissue or your sleeve or elbow instead of into your hand or into the air.  Contact a health care provider if: You are getting worse instead of better. You have a fever or chills. Your mucus is brown or red. You have yellow or brown discharge coming from your nose. You have pain in your face, especially when you bend forward. You have swollen neck glands. You have pain while swallowing. You have white areas in the back of your throat. Get help right away if: You have shortness of breath that gets worse. You have severe or persistent: Headache. Ear pain. Sinus pain. Chest pain. You have chronic lung disease along with any of the following: Making high-pitched whistling sounds when you breathe, most often when you breathe out (wheezing). Prolonged cough (more than 14 days). Coughing up blood. A change in your usual mucus. You have a stiff neck. You have changes in your: Vision. Hearing. Thinking. Mood. These symptoms may be an emergency. Get help right away. Call 911. Do not wait to see if the symptoms will go away. Do not drive yourself to the hospital. Summary An upper respiratory infection (URI) is a common infection of the nose, throat, and upper air passages that lead to the lungs. A URI is caused by a virus. URIs usually get better on their own within 7-10 days. Medicines cannot cure URIs, but your health care provider may recommend certain medicines to help relieve symptoms. This information  is not intended to replace advice given to you by your health care provider. Make sure you discuss any questions you have with your health care provider. Document Revised: 03/18/2021 Document Reviewed: 03/18/2021 Elsevier Patient Education  2024 [employer]   If you have been instructed to have an in-person evaluation today at a local Urgent Care facility, please use the link below. It will take you to a list of all of our available [facility]  Urgent Cares, including address, phone number and hours of operation. Please do not delay care.  [facility]  If you or a family member do not have a primary care provider, use the link below to schedule a visit and establish care. When you choose a [facility] primary care physician or advanced practice provider, you gain a long-term partner in health. Find a Primary Care Provider  Learn more about [facility]'s in-office and virtual care options: [facility] - Get Care Now

## 2024-04-09 ENCOUNTER — Other Ambulatory Visit: Payer: Self-pay | Admitting: Family Medicine

## 2024-04-09 ENCOUNTER — Ambulatory Visit (INDEPENDENT_AMBULATORY_CARE_PROVIDER_SITE_OTHER): Admitting: Nurse Practitioner

## 2024-04-09 ENCOUNTER — Ambulatory Visit (HOSPITAL_COMMUNITY)
Admission: RE | Admit: 2024-04-09 | Discharge: 2024-04-09 | Disposition: A | Discharge status: Home / Self Care | Source: Ambulatory Visit | Attending: Nurse Practitioner | Admitting: Nurse Practitioner

## 2024-04-09 VITALS — BP 131/69 | HR 70 | Resp 18 | Ht 71.0 in | Wt 197.0 lb

## 2024-04-09 DIAGNOSIS — R0602 Shortness of breath: Secondary | ICD-10-CM | POA: Diagnosis present

## 2024-04-09 DIAGNOSIS — I10 Essential (primary) hypertension: Secondary | ICD-10-CM

## 2024-04-09 DIAGNOSIS — J069 Acute upper respiratory infection, unspecified: Secondary | ICD-10-CM | POA: Insufficient documentation

## 2024-04-09 MED ORDER — AZITHROMYCIN 500 MG PO TABS: 500.0000 mg | ORAL_TABLET | Freq: Every day | ORAL | 0 refills | Status: DC

## 2024-04-09 NOTE — Progress Notes (Signed)
 Established Patient Office Visit  Subjective:  Patient ID: William Mcmahon, male    DOB: 21-Mar-1961  Age: 63 y.o. MRN: [id number]  Chief Complaint  Patient presents with   Cough    Ongoing cough since July, has had 2 video visits with us  since then, no improvement. Cough is dry, unproductive. Has nasal drainage, worse at night. Denies fever. Has taken prescribed meds as well as otc but no relief.     Patient here today for chest congestion and states it has been going on for over a month, has had two virtual visits.  Has taken two boxes of Mucinex.  Patient was prescribed augmentin  and a pred course which he finished.  He also was using Equate Afrin NS.  Has not improved.  Does not smoke but has family who does smoke.  Intermittent productive cough, green or brown colored sputum.  C/o SOB.    Cough    No other concerns at this time.   Past Medical History:  Diagnosis Date   Allergy    Naproxen   Arthritis 2020   CAD in native artery, with prior stents, and instent restenosis of small vessel, medical therapy, LAD and RCA stents are patent 09/12/2018   Cancer (HCC)    thyroid    CHF (congestive heart failure) (HCC) 2025   Clotting disorder (HCC)    DM (diabetes mellitus), type 2 (HCC) diet controlled 09/12/2018   Heart murmur 2020   HLD (hyperlipidemia) 09/12/2018   Hyperlipidemia    Hypertension    Myocardial infarction (HCC) 2020   PAF (paroxysmal atrial fibrillation) (HCC)    Syncope- may be due to diuretics 09/12/2018   Thyroid  disease     Past Surgical History:  Procedure Laterality Date   CARDIAC SURGERY     stents   CHOLECYSTECTOMY     EYE SURGERY  1978   LEFT HEART CATH AND CORONARY ANGIOGRAPHY N/A 09/11/2018   Procedure: LEFT HEART CATH AND CORONARY ANGIOGRAPHY;  Surgeon: Anner Alm ORN, MD;  Location: [facility] INVASIVE CV LAB;  Service: Cardiovascular;  Laterality: N/A;    Social History   Socioeconomic History   Marital status: Married    Spouse name: Jaedon Siler   Number of children: Not on file   Years of education: Not on file   Highest education level: Associate degree: academic program  Occupational History   Not on file  Tobacco Use   Smoking status: Former   Smokeless tobacco: Never  Vaping Use   Vaping status: Never Used  Substance and Sexual Activity   Alcohol use: Never   Drug use: Yes    Types: Marijuana   Sexual activity: Not Currently    Partners: Female  Other Topics Concern   Not on file  Social History Narrative   Not on file   Social Drivers of Health   Financial Resource Strain: Patient Declined (04/09/2024)   Overall Financial Resource Strain (CARDIA)    Difficulty of Paying Living Expenses: Patient declined  Food Insecurity: Patient Declined (04/09/2024)   Hunger Vital Sign    Worried About Running Out of Food in the Last Year: Patient declined    Ran Out of Food in the Last Year: Patient declined  Transportation Needs: Unmet Transportation Needs (04/09/2024)   PRAPARE - Administrator, Civil Service (Medical): Yes    Lack of Transportation (Non-Medical): No  Physical Activity: Sufficiently Active (04/09/2024)   Exercise Vital Sign    Days of Exercise per Week: 4  days    Minutes of Exercise per Session: 60 min  Stress: Stress Concern Present (04/09/2024)   [employer] of Occupational Health - Occupational Stress Questionnaire    Feeling of Stress: To some extent  Social Connections: Socially Isolated (04/09/2024)   Social Connection and Isolation Panel    Frequency of Communication with Friends and Family: Once a week    Frequency of Social Gatherings with Friends and Family: Twice a week    Attends Religious Services: Never    Database administrator or Organizations: No    Attends Engineer, structural: Not on file    Marital Status: Divorced  Intimate Partner Violence: Not At Risk (03/23/2023)   Received from [facility]   HITS    Over the last 12 months how often did your  partner physically hurt you?: Never    Over the last 12 months how often did your partner insult you or talk down to you?: Sometimes    Over the last 12 months how often did your partner threaten you with physical harm?: Never    Over the last 12 months how often did your partner scream or curse at you?: Sometimes    Family History  Problem Relation Age of Onset   Diabetes Mother    CAD Father        s/p stent and CABG   Heart disease Father    Heart attack Maternal Grandfather     Allergies  Allergen Reactions   Naproxen Swelling    Outpatient Medications Prior to Visit  Medication Sig   albuterol  (VENTOLIN  HFA) 108 (90 Base) MCG/ACT inhaler Inhale 1-2 puffs into the lungs every 6 (six) hours as needed.   amLODipine  (NORVASC ) 5 MG tablet Take 1.5 tablets (7.5 mg total) by mouth daily.   apixaban  (ELIQUIS ) 5 MG TABS tablet Take 1 tablet (5 mg total) by mouth 2 (two) times daily.   atorvastatin  (LIPITOR ) 80 MG tablet Take 1 tablet (80 mg total) by mouth daily.   carvedilol  (COREG ) 12.5 MG tablet Take 1 tablet (12.5 mg total) by mouth 2 (two) times daily with a meal.   clopidogrel  (PLAVIX ) 75 MG tablet Take 1 tablet (75 mg total) by mouth daily.   colestipol  (COLESTID ) 1 g tablet Take 1 g by mouth 2 (two) times daily.   diazepam  (VALIUM ) 10 MG tablet 1-2 tabs po 1 hour prior to procedure.   dicyclomine  (BENTYL ) 20 MG tablet Take 20 mg by mouth 3 (three) times daily before meals. Takes two hours before or five hours after other medications   ezetimibe  (ZETIA ) 10 MG tablet Take 1 tablet (10 mg total) by mouth daily.   isosorbide  mononitrate (IMDUR ) 60 MG 24 hr tablet Take 1 tablet (60 mg total) by mouth daily.   levothyroxine  (SYNTHROID ) 125 MCG tablet TAKE 1 TABLET BY MOUTH ONCE DAILY BEFORE BREAKFAST   lipase/protease/amylase (CREON ) 36000 UNITS CPEP capsule Take 72,000 Units by mouth 3 (three) times daily with meals.   magnesium  (MAGTAB) 84 MG ( ) TBCR SR tablet Take 84 mg by  mouth daily.   metFORMIN  (GLUCOPHAGE ) 500 MG tablet Take 1 tablet (500 mg total) by mouth daily with breakfast.   nitroGLYCERIN  (NITROSTAT ) 0.4 MG SL tablet Place 1 tablet (0.4 mg total) under the tongue every 5 (five) minutes as needed for chest pain.   ondansetron  (ZOFRAN -ODT) 4 MG disintegrating tablet Take 1 tablet (4 mg total) by mouth every 8 (eight) hours as needed.   potassium chloride  (KLOR-CON )  10 MEQ tablet TAKE 2 TABLETS BY MOUTH ON MONDAY, WEDNESDAY AND FRIDAY, THEN 1 TABLET ONCE DAILY ON TUESDAY, THURSDAY, SATURDAY AND SUNDAY   amoxicillin -clavulanate (AUGMENTIN ) 875-125 MG tablet Take 1 tablet by mouth 2 (two) times daily.   benzonatate  (TESSALON ) 100 MG capsule Take 1-2 capsules (100-200 mg total) by mouth 3 (three) times daily as needed.   doxycycline  (VIBRA -TABS) 100 MG tablet Take 1 tablet (100 mg total) by mouth 2 (two) times daily.   predniSONE  (STERAPRED UNI-PAK 21 TAB) 10 MG (21) TBPK tablet 6 day taper; take as directed on package instructions   No facility-administered medications prior to visit.    Review of Systems  Respiratory:  Positive for cough.        Objective:   BP 131/69   Pulse 70   Resp 18   Ht 5' 11 (1.803 m)   Wt 197 lb (89.4 kg)   SpO2 98%   BMI 27.48 kg/m   Vitals:   04/09/24 1039  BP: 131/69  Pulse: 70  Resp: 18  Height: 5' 11 (1.803 m)  Weight: 197 lb (89.4 kg)  SpO2: 98%  BMI (Calculated): 27.49    Physical Exam Vitals and nursing note reviewed.  Constitutional:      Appearance: Normal appearance.  HENT:     Head: Normocephalic.     Nose: Congestion present.     Mouth/Throat:     Mouth: Mucous membranes are dry.  Eyes:     Pupils: Pupils are equal, round, and reactive to light.  Cardiovascular:     Rate and Rhythm: Normal rate and regular rhythm.     Pulses: Normal pulses.     Heart sounds: Normal heart sounds.  Pulmonary:     Breath sounds: Wheezing present.  Abdominal:     General: Abdomen is flat.      Palpations: Abdomen is soft.  Musculoskeletal:        General: Normal range of motion.     Cervical back: Normal range of motion and neck supple.  Skin:    General: Skin is warm and dry.  Neurological:     Mental Status: He is alert and oriented to person, place, and time.  Psychiatric:        Mood and Affect: Mood normal.        Behavior: Behavior normal.      No results found for any visits on 04/09/24.  Recent Results (from the past 2160 hours)  TSH + free T4     Status: None   Collection Time: 02/08/24 11:41 AM  Result Value Ref Range   TSH 0.787 0.450 - 4.500 uIU/mL   Free T4 1.42 0.82 - 1.77 ng/dL  Hemoglobin J8R     Status: Abnormal   Collection Time: 02/08/24 11:41 AM  Result Value Ref Range   Hgb A1c MFr Bld 6.3 (H) 4.8 - 5.6 %    Comment:          Prediabetes: 5.7 - 6.4          Diabetes: >6.4          Glycemic control for adults with diabetes: <7.0    Est. average glucose Bld gHb Est-mCnc 134 mg/dL  AFE1+zHQM     Status: None   Collection Time: 02/08/24 11:41 AM  Result Value Ref Range   Glucose 93 70 - 99 mg/dL   BUN 9 8 - 27 mg/dL   Creatinine, Ser 9.12 0.76 - 1.27 mg/dL   eGFR 98 >  59 mL/min/1.73   BUN/Creatinine Ratio 10 10 - 24   Sodium 140 134 - 144 mmol/L   Potassium 4.0 3.5 - 5.2 mmol/L   Chloride 100 96 - 106 mmol/L   CO2 24 20 - 29 mmol/L   Calcium  9.3 8.6 - 10.2 mg/dL  Lipid Profile     Status: Abnormal   Collection Time: 02/08/24 11:41 AM  Result Value Ref Range   Cholesterol, Total 82 (L) 100 - 199 mg/dL   Triglycerides 891 0 - 149 mg/dL   HDL 28 (L) >60 mg/dL   VLDL Cholesterol Cal 20 5 - 40 mg/dL   LDL Chol Calc (NIH) 34 0 - 99 mg/dL   Chol/HDL Ratio 2.9 0.0 - 5.0 ratio    Comment:                                   T. Chol/HDL Ratio                                             Men  Women                               1/2 Avg.Risk  3.4    3.3                                   Avg.Risk  5.0    4.4                                2X  Avg.Risk  9.6    7.1                                3X Avg.Risk 23.4   11.0   Urinalysis, Routine w reflex microscopic     Status: Abnormal   Collection Time: 02/16/24  9:27 AM  Result Value Ref Range   Specific Gravity, UA 1.010 1.005 - 1.030   pH, UA 6.0 5.0 - 7.5   Color, UA Yellow Yellow   Appearance Ur Clear Clear   Leukocytes,UA Negative Negative   Protein,UA Negative Negative/Trace   Glucose, UA Negative Negative   Ketones, UA Negative Negative   RBC, UA 1+ (A) Negative   Bilirubin, UA Negative Negative   Urobilinogen, Ur 0.2 0.2 - 1.0 mg/dL   Nitrite, UA Negative Negative   Microscopic Examination See below:     Comment: Microscopic was indicated and was performed.  Microscopic Examination     Status: Abnormal   Collection Time: 02/16/24  9:27 AM   Urine  Result Value Ref Range   WBC, UA 0-5 0 - 5 /hpf   RBC, Urine 11-30 (A) 0 - 2 /hpf   Epithelial Cells (non renal) 0-10 0 - 10 /hpf   Bacteria, UA None seen None seen/Few      Assessment & Plan:   Problem List Items Addressed This Visit   None   No follow-ups on file.   Total time spent: 20 minutes  Neale Carpen, NP  04/09/2024   This document may have been  prepared by [employer] and as such may include unintentional dictation errors.

## 2024-04-09 NOTE — Patient Instructions (Signed)
 1) Keep drinking water 2) Mucinex DM in green box 3) Use inhaler 1 puff every 4 hours as needed for shortness of breath 4) CXR 5) Robitussin for cough control 6) Follow up appt in 1 week

## 2024-04-12 ENCOUNTER — Ambulatory Visit: Payer: Self-pay

## 2024-04-12 NOTE — Telephone Encounter (Signed)
-----   Message from Brandon sent at 04/12/2024  1:01 PM EDT ----- Regarding: CXR Results Please let patient know that chest xray showed no pneumonia, overall normal. Thanks ----- Message ----- From: Interface, Rad Results In Sent: 04/12/2024  10:37 AM EDT To: Neale Carpen, NP

## 2024-04-12 NOTE — Telephone Encounter (Signed)
 Patient informed of results.

## 2024-04-16 ENCOUNTER — Ambulatory Visit: Admitting: Nurse Practitioner

## 2024-04-17 ENCOUNTER — Other Ambulatory Visit: Payer: Self-pay | Admitting: Family Medicine

## 2024-04-20 ENCOUNTER — Encounter: Payer: Self-pay | Admitting: Nurse Practitioner

## 2024-04-20 ENCOUNTER — Ambulatory Visit (INDEPENDENT_AMBULATORY_CARE_PROVIDER_SITE_OTHER): Admitting: Nurse Practitioner

## 2024-04-20 VITALS — BP 128/69 | HR 57 | Ht 70.0 in | Wt 199.6 lb

## 2024-04-20 DIAGNOSIS — E119 Type 2 diabetes mellitus without complications: Secondary | ICD-10-CM | POA: Diagnosis not present

## 2024-04-20 DIAGNOSIS — Z7984 Long term (current) use of oral hypoglycemic drugs: Secondary | ICD-10-CM | POA: Diagnosis not present

## 2024-04-20 DIAGNOSIS — I10 Essential (primary) hypertension: Secondary | ICD-10-CM | POA: Diagnosis not present

## 2024-04-20 NOTE — Patient Instructions (Addendum)
 1) URI - resolved 2) Keep follow up appt in future already scheduled

## 2024-04-20 NOTE — Progress Notes (Addendum)
 Established Patient Office Visit  Subjective:  Patient ID: William Mcmahon, male    DOB: [birth year]  Age: 63 y.o. MRN: [id number]  Chief Complaint  Patient presents with   Follow-up    1 week f/u     Patient here today for a 1 week follow up and reports overall congestive symptoms are improved.  Leaving soon for fishing trip.  Does consider taking nightly allergy medication to see if PND will improve.    No other concerns at this time.   Past Medical History:  Diagnosis Date   Allergy    Naproxen   Arthritis 2020   CAD in native artery, with prior stents, and instent restenosis of small vessel, medical therapy, LAD and RCA stents are patent 09/12/2018   Cancer (HCC)    thyroid    CHF (congestive heart failure) (HCC) 2025   Clotting disorder (HCC)    DM (diabetes mellitus), type 2 (HCC) diet controlled 09/12/2018   Heart murmur 2020   HLD (hyperlipidemia) 09/12/2018   Hyperlipidemia    Hypertension    Myocardial infarction (HCC) 2020   PAF (paroxysmal atrial fibrillation) (HCC)    Syncope- may be due to diuretics 09/12/2018   Thyroid  disease     Past Surgical History:  Procedure Laterality Date   CARDIAC SURGERY     stents   CHOLECYSTECTOMY     EYE SURGERY  1978   LEFT HEART CATH AND CORONARY ANGIOGRAPHY N/A 09/11/2018   Procedure: LEFT HEART CATH AND CORONARY ANGIOGRAPHY;  Surgeon: Anner Alm ORN, MD;  Location: [facility] INVASIVE CV LAB;  Service: Cardiovascular;  Laterality: N/A;    Social History   Socioeconomic History   Marital status: Married    Spouse name: Herb Beltre   Number of children: Not on file   Years of education: Not on file   Highest education level: Associate degree: academic program  Occupational History   Not on file  Tobacco Use   Smoking status: Former   Smokeless tobacco: Never  Vaping Use   Vaping status: Never Used  Substance and Sexual Activity   Alcohol use: Never   Drug use: Yes    Types: Marijuana   Sexual activity: Not  Currently    Partners: Female  Other Topics Concern   Not on file  Social History Narrative   Not on file   Social Drivers of Health   Financial Resource Strain: Patient Declined (04/09/2024)   Overall Financial Resource Strain (CARDIA)    Difficulty of Paying Living Expenses: Patient declined  Food Insecurity: Patient Declined (04/09/2024)   Hunger Vital Sign    Worried About Running Out of Food in the Last Year: Patient declined    Ran Out of Food in the Last Year: Patient declined  Transportation Needs: Unmet Transportation Needs (04/09/2024)   PRAPARE - Administrator, Civil Service (Medical): Yes    Lack of Transportation (Non-Medical): No  Physical Activity: Sufficiently Active (04/09/2024)   Exercise Vital Sign    Days of Exercise per Week: 4 days    Minutes of Exercise per Session: 60 min  Stress: Stress Concern Present (04/09/2024)   [employer] of Occupational Health - Occupational Stress Questionnaire    Feeling of Stress: To some extent  Social Connections: Socially Isolated (04/09/2024)   Social Connection and Isolation Panel    Frequency of Communication with Friends and Family: Once a week    Frequency of Social Gatherings with Friends and Family: Twice a week  Attends Religious Services: Never    Active Member of Clubs or Organizations: No    Attends Banker Meetings: Not on file    Marital Status: Divorced  Intimate Partner Violence: Not At Risk (03/23/2023)   Received from [facility]   HITS    Over the last 12 months how often did your partner physically hurt you?: Never    Over the last 12 months how often did your partner insult you or talk down to you?: Sometimes    Over the last 12 months how often did your partner threaten you with physical harm?: Never    Over the last 12 months how often did your partner scream or curse at you?: Sometimes    Family History  Problem Relation Age of Onset   Diabetes Mother    CAD Father         s/p stent and CABG   Heart disease Father    Heart attack Maternal Grandfather     Allergies  Allergen Reactions   Naproxen Swelling    Outpatient Medications Prior to Visit  Medication Sig   albuterol  (VENTOLIN  HFA) 108 (90 Base) MCG/ACT inhaler Inhale 1-2 puffs into the lungs every 6 (six) hours as needed.   amLODipine  (NORVASC ) 5 MG tablet Take 1.5 tablets (7.5 mg total) by mouth daily.   apixaban  (ELIQUIS ) 5 MG TABS tablet Take 1 tablet (5 mg total) by mouth 2 (two) times daily.   atorvastatin  (LIPITOR ) 80 MG tablet Take 1 tablet (80 mg total) by mouth daily.   azithromycin  (ZITHROMAX ) 500 MG tablet Take 1 tablet (500 mg total) by mouth daily.   benzonatate  (TESSALON ) 100 MG capsule Take 1-2 capsules (100-200 mg total) by mouth 3 (three) times daily as needed.   carvedilol  (COREG ) 12.5 MG tablet Take 1 tablet (12.5 mg total) by mouth 2 (two) times daily with a meal.   clopidogrel  (PLAVIX ) 75 MG tablet Take 1 tablet (75 mg total) by mouth daily.   colestipol  (COLESTID ) 1 g tablet Take 1 g by mouth 2 (two) times daily.   diazepam  (VALIUM ) 10 MG tablet 1-2 tabs po 1 hour prior to procedure.   dicyclomine  (BENTYL ) 20 MG tablet Take 20 mg by mouth 3 (three) times daily before meals. Takes two hours before or five hours after other medications   ezetimibe  (ZETIA ) 10 MG tablet Take 1 tablet (10 mg total) by mouth daily.   isosorbide  mononitrate (IMDUR ) 60 MG 24 hr tablet Take 1 tablet (60 mg total) by mouth daily.   levothyroxine  (SYNTHROID ) 125 MCG tablet TAKE 1 TABLET BY MOUTH ONCE DAILY BEFORE BREAKFAST   lipase/protease/amylase (CREON ) 36000 UNITS CPEP capsule Take 72,000 Units by mouth 3 (three) times daily with meals.   magnesium  (MAGTAB) 84 MG ( ) TBCR SR tablet Take 84 mg by mouth daily.   metFORMIN  (GLUCOPHAGE ) 500 MG tablet Take 1 tablet by mouth once daily with breakfast   nitroGLYCERIN  (NITROSTAT ) 0.4 MG SL tablet Place 1 tablet (0.4 mg total) under the tongue every 5  (five) minutes as needed for chest pain.   ondansetron  (ZOFRAN -ODT) 4 MG disintegrating tablet Take 1 tablet (4 mg total) by mouth every 8 (eight) hours as needed.   potassium chloride  (KLOR-CON ) 10 MEQ tablet TAKE 2 TABLETS BY MOUTH ON MONDAY, WEDNESDAY AND FRIDAY, THEN 1 TABLET ONCE DAILY ON TUESDAY, THURSDAY, SATURDAY AND SUNDAY   predniSONE  (STERAPRED UNI-PAK 21 TAB) 10 MG (21) TBPK tablet 6 day taper; take as directed on package instructions  No facility-administered medications prior to visit.    ROS     Objective:   BP 128/69   Pulse (!) 57   Ht 5' 10 (1.778 m)   Wt 199 lb 9.6 oz (90.5 kg)   SpO2 99%   BMI 28.64 kg/m   Vitals:   04/20/24 1047  BP: 128/69  Pulse: (!) 57  Height: 5' 10 (1.778 m)  Weight: 199 lb 9.6 oz (90.5 kg)  SpO2: 99%  BMI (Calculated): 28.64    Physical Exam Vitals and nursing note reviewed.  Constitutional:      Appearance: Normal appearance.  HENT:     Head: Normocephalic.     Nose: Nose normal.     Mouth/Throat:     Mouth: Mucous membranes are moist.  Cardiovascular:     Rate and Rhythm: Normal rate and regular rhythm.     Pulses: Normal pulses.     Heart sounds: Normal heart sounds.  Pulmonary:     Effort: Pulmonary effort is normal.     Breath sounds: Normal breath sounds.  Musculoskeletal:        General: Normal range of motion.     Cervical back: Normal range of motion and neck supple.  Skin:    General: Skin is warm and dry.  Neurological:     Mental Status: He is alert and oriented to person, place, and time.  Psychiatric:        Mood and Affect: Mood normal.        Behavior: Behavior normal.      No results found for any visits on 04/20/24.  Recent Results (from the past 2160 hours)  TSH + free T4     Status: None   Collection Time: 02/08/24 11:41 AM  Result Value Ref Range   TSH 0.787 0.450 - 4.500 uIU/mL   Free T4 1.42 0.82 - 1.77 ng/dL  Hemoglobin J8R     Status: Abnormal   Collection Time: 02/08/24  11:41 AM  Result Value Ref Range   Hgb A1c MFr Bld 6.3 (H) 4.8 - 5.6 %    Comment:          Prediabetes: 5.7 - 6.4          Diabetes: >6.4          Glycemic control for adults with diabetes: <7.0    Est. average glucose Bld gHb Est-mCnc 134 mg/dL  AFE1+zHQM     Status: None   Collection Time: 02/08/24 11:41 AM  Result Value Ref Range   Glucose 93 70 - 99 mg/dL   BUN 9 8 - 27 mg/dL   Creatinine, Ser 9.12 0.76 - 1.27 mg/dL   eGFR 98 >40 fO/fpw/8.26   BUN/Creatinine Ratio 10 10 - 24   Sodium 140 134 - 144 mmol/L   Potassium 4.0 3.5 - 5.2 mmol/L   Chloride 100 96 - 106 mmol/L   CO2 24 20 - 29 mmol/L   Calcium  9.3 8.6 - 10.2 mg/dL  Lipid Profile     Status: Abnormal   Collection Time: 02/08/24 11:41 AM  Result Value Ref Range   Cholesterol, Total 82 (L) 100 - 199 mg/dL   Triglycerides 891 0 - 149 mg/dL   HDL 28 (L) >60 mg/dL   VLDL Cholesterol Cal 20 5 - 40 mg/dL   LDL Chol Calc (NIH) 34 0 - 99 mg/dL   Chol/HDL Ratio 2.9 0.0 - 5.0 ratio    Comment:  T. Chol/HDL Ratio                                             Men  Women                               1/2 Avg.Risk  3.4    3.3                                   Avg.Risk  5.0    4.4                                2X Avg.Risk  9.6    7.1                                3X Avg.Risk 23.4   11.0   Urinalysis, Routine w reflex microscopic     Status: Abnormal   Collection Time: 02/16/24  9:27 AM  Result Value Ref Range   Specific Gravity, UA 1.010 1.005 - 1.030   pH, UA 6.0 5.0 - 7.5   Color, UA Yellow Yellow   Appearance Ur Clear Clear   Leukocytes,UA Negative Negative   Protein,UA Negative Negative/Trace   Glucose, UA Negative Negative   Ketones, UA Negative Negative   RBC, UA 1+ (A) Negative   Bilirubin, UA Negative Negative   Urobilinogen, Ur 0.2 0.2 - 1.0 mg/dL   Nitrite, UA Negative Negative   Microscopic Examination See below:     Comment: Microscopic was indicated and was performed.   Microscopic Examination     Status: Abnormal   Collection Time: 02/16/24  9:27 AM   Urine  Result Value Ref Range   WBC, UA 0-5 0 - 5 /hpf   RBC, Urine 11-30 (A) 0 - 2 /hpf   Epithelial Cells (non renal) 0-10 0 - 10 /hpf   Bacteria, UA None seen None seen/Few      Assessment & Plan:  1) URI - resolved 2) Keep follow up appt in future already scheduled   Problem List Items Addressed This Visit   None   No follow-ups on file.   Total time spent: 20 minutes  Neale Carpen, NP  04/20/2024   This document may have been prepared by [facility] Voice Recognition software and as such may include unintentional dictation errors.

## 2024-06-13 ENCOUNTER — Ambulatory Visit

## 2024-06-13 VITALS — BP 129/71 | HR 57 | Ht 70.0 in | Wt 203.1 lb

## 2024-06-13 DIAGNOSIS — I2510 Atherosclerotic heart disease of native coronary artery without angina pectoris: Secondary | ICD-10-CM | POA: Diagnosis not present

## 2024-06-13 DIAGNOSIS — E119 Type 2 diabetes mellitus without complications: Secondary | ICD-10-CM | POA: Diagnosis not present

## 2024-06-13 DIAGNOSIS — E038 Other specified hypothyroidism: Secondary | ICD-10-CM

## 2024-06-13 DIAGNOSIS — E7800 Pure hypercholesterolemia, unspecified: Secondary | ICD-10-CM

## 2024-06-13 DIAGNOSIS — E890 Postprocedural hypothyroidism: Secondary | ICD-10-CM

## 2024-06-13 DIAGNOSIS — Z7984 Long term (current) use of oral hypoglycemic drugs: Secondary | ICD-10-CM

## 2024-06-13 DIAGNOSIS — I10 Essential (primary) hypertension: Secondary | ICD-10-CM

## 2024-06-13 MED ORDER — METFORMIN HCL 500 MG PO TABS: 500.0000 mg | ORAL_TABLET | Freq: Every day | ORAL | 1 refills | Status: AC

## 2024-06-13 NOTE — Progress Notes (Signed)
 Established Patient Office Visit  Subjective   Patient ID: William Mcmahon, male    DOB: 1960-12-14  Age: 63 y.o. MRN: [id number]  Chief Complaint  Patient presents with   Medical Management of Chronic Issues    4 month follow up    HPI Discussed the use of AI scribe software for clinical note transcription with the patient, who gave verbal consent to proceed.  History of Present Illness   William Mcmahon is a 63 year old male who presents with hematuria.  Hematuria - Persistent hematuria present. - On Eliquis  for several years. - Has not seen results of recent cystoscopy.  Peripheral edema - Swelling in feet, more pronounced at the end of the day and resolves by morning. - Swelling is more pronounced in the left knee but not severe.  Dental anxiety - Significant anxiety related to dental visits. - Requires Valium  for dental procedures due to past traumatic experiences. - Physical symptoms of anxiety include sweating during dental visits.  Potassium supplementation issues - Currently taking chloroquine for potassium supplementation. - Pills appear intact in stool, indicating improper dissolution. - Concern for potassium depletion due to poor absorption, with prior history of heart problems related to low potassium.  Medication management - Takes metformin  but is unsure about current supply. - Takes Centrum multivitamins. - No new medication-related issues or concerns today.      Patient Active Problem List   Diagnosis Date Noted   Upper respiratory infection, acute 04/09/2024   Acute non-recurrent frontal sinusitis 10/12/2023   Bilateral impacted cerumen 10/12/2023   Renal cyst 05/05/2023   Renal stones 05/05/2023   Type 2 diabetes mellitus without complication, without long-term current use of insulin  (HCC) 05/05/2023   Idiopathic chronic pancreatitis (HCC) 02/01/2023   Hypomagnesemia 01/25/2023   Jaw pain 01/25/2023   Generalized weakness 01/23/2023   AKI  (acute kidney injury) 01/23/2023   Acute diarrhea 01/23/2023   Dehydration 01/23/2023   Other fatigue 01/23/2023   Dyspnea 01/23/2023   Leukocytosis 03/31/2021   Hypophosphatemia 03/31/2021   TSH (thyroid -stimulating hormone deficiency) 03/31/2021   Hyperglycemia 03/31/2021   Obesity (BMI 30.0-34.9) 03/31/2021   Atypical chest pain 03/30/2021   PAF (paroxysmal atrial fibrillation) (HCC) 12/01/2020   Dysphagia 12/01/2020   Demand ischemia (HCC) 09/22/2020   Atrial fibrillation with RVR (HCC) 09/21/2020   History of thyroid  cancer 03/31/2020   Educated about COVID-19 virus infection 02/07/2019   Hypercholesteremia 01/15/2019   Essential hypertension 11/29/2018   Palpitations 11/29/2018   SOB (shortness of breath) 11/29/2018   HLD (hyperlipidemia) 09/12/2018   DM (diabetes mellitus), type 2 (HCC) diet controlled 09/12/2018   Hypokalemia 09/12/2018   Syncope- may be due to diuretics 09/12/2018   Unstable angina (HCC) 09/10/2018   Bilateral numbness and tingling of arms and legs 10/21/2017   Unintentional weight loss 10/21/2017   Postoperative hypothyroidism 06/27/2017   Transaminitis 06/27/2017   Coronary artery disease 06/27/2017    ROS    Objective:     BP 129/71   Pulse (!) 57   Ht 5' 10 (1.778 m)   Wt 203 lb 1.9 oz (92.1 kg)   SpO2 96%   BMI 29.14 kg/m  BP Readings from Last 3 Encounters:  06/13/24 129/71  04/20/24 128/69  04/09/24 131/69   Wt Readings from Last 3 Encounters:  06/13/24 203 lb 1.9 oz (92.1 kg)  04/20/24 199 lb 9.6 oz (90.5 kg)  04/09/24 197 lb (89.4 kg)     Physical Exam Vitals and nursing note  reviewed.  Constitutional:      Appearance: Normal appearance.  HENT:     Head: Normocephalic.  Eyes:     Extraocular Movements: Extraocular movements intact.     Pupils: Pupils are equal, round, and reactive to light.  Cardiovascular:     Rate and Rhythm: Normal rate and regular rhythm.  Pulmonary:     Effort: Pulmonary effort is normal.      Breath sounds: Normal breath sounds.  Musculoskeletal:     Cervical back: Normal range of motion and neck supple.  Neurological:     Mental Status: He is alert and oriented to person, place, and time.  Psychiatric:        Mood and Affect: Mood normal.        Thought Content: Thought content normal.      No results found for any visits on 06/13/24.    The ASCVD Risk score (Arnett DK, et al., 2019) failed to calculate for the following reasons:   Risk score cannot be calculated because patient has a medical history suggesting prior/existing ASCVD    Assessment & Plan:   Problem List Items Addressed This Visit       Cardiovascular and Mediastinum   Coronary artery disease (Chronic)   Managed by cardiology.  Update labs today.        Essential hypertension   Vitals:   06/13/24 0810  BP: 129/71  Patient reports taking amlodipine  5 mg once daily, carvedilol  12.5 mg twice daily, Imdur  60 mg once daily. Patient is currently being followed by Cardiology  Labs ordered.       Relevant Orders   CMP14+EGFR   RESOLVED: CAD in native artery, with prior stents, and instent restenosis of small vessel, medical therapy, LAD and RCA stents are patent   Relevant Orders   CBC with Differential/Platelet     Endocrine   Postoperative hypothyroidism (Chronic)   Update thyroid  labs today.      Relevant Orders   TSH + free T4   Type 2 diabetes mellitus without complication, without long-term current use of insulin  (HCC) - Primary   Type 2 diabetes mellitus managed with metformin . Update labs today.  - Refill metformin  prescription.      Relevant Medications   metFORMIN  (GLUCOPHAGE ) 500 MG tablet   Other Relevant Orders   CMP14+EGFR   HgB A1c   Urine Microalbumin w/creat. ratio     Other   Hypercholesteremia   Currently managed with atorvastatin  80 mg.  Update fasting labs.  F/U according to lab results.        Relevant Orders   CMP14+EGFR   Lipid Profile         Return in about 4 months (around 10/14/2024) for chronic follow-up with PCP.    [name], FNP

## 2024-06-17 NOTE — Assessment & Plan Note (Signed)
 Vitals:   06/13/24 0810  BP: 129/71  Patient reports taking amlodipine  5 mg once daily, carvedilol  12.5 mg twice daily, Imdur  60 mg once daily. Patient is currently being followed by Cardiology  Labs ordered.

## 2024-06-17 NOTE — Assessment & Plan Note (Signed)
 Type 2 diabetes mellitus managed with metformin . Update labs today.  - Refill metformin  prescription.

## 2024-06-17 NOTE — Assessment & Plan Note (Signed)
 Currently managed with atorvastatin  80 mg.  Update fasting labs.  F/U according to lab results.

## 2024-06-17 NOTE — Assessment & Plan Note (Signed)
 Update thyroid labs today

## 2024-06-17 NOTE — Assessment & Plan Note (Addendum)
 Managed by cardiology.  Update labs today.

## 2024-07-18 ENCOUNTER — Ambulatory Visit

## 2024-07-18 VITALS — Ht 70.0 in | Wt 203.0 lb

## 2024-07-18 DIAGNOSIS — Z0000 Encounter for general adult medical examination without abnormal findings: Secondary | ICD-10-CM

## 2024-07-18 DIAGNOSIS — Z0001 Encounter for general adult medical examination with abnormal findings: Secondary | ICD-10-CM

## 2024-07-18 DIAGNOSIS — E119 Type 2 diabetes mellitus without complications: Secondary | ICD-10-CM

## 2024-07-18 DIAGNOSIS — F17210 Nicotine dependence, cigarettes, uncomplicated: Secondary | ICD-10-CM | POA: Diagnosis not present

## 2024-07-18 MED ORDER — LANCET DEVICE MISC: 1.0000 | Freq: Three times a day (TID) | 0 refills | Status: AC

## 2024-07-18 MED ORDER — BLOOD GLUCOSE TEST VI STRP: 1.0000 | ORAL_STRIP | Freq: Three times a day (TID) | 0 refills | Status: AC

## 2024-07-18 MED ORDER — LANCETS MISC: 1.0000 | 0 refills | Status: AC

## 2024-07-18 MED ORDER — BLOOD GLUCOSE MONITORING SUPPL DEVI: 1.0000 | Freq: Three times a day (TID) | 0 refills | Status: AC

## 2024-07-18 NOTE — Progress Notes (Signed)
 Chief Complaint  Patient presents with   Medicare Wellness     Subjective:   William Mcmahon is a 63 y.o. male who presents for a Medicare Annual Wellness Visit.  Allergies (verified) Naproxen   History: Past Medical History:  Diagnosis Date   Allergy    Naproxen   Arthritis 2020   CAD in native artery, with prior stents, and instent restenosis of small vessel, medical therapy, LAD and RCA stents are patent 09/12/2018   Cancer (HCC)    thyroid    CHF (congestive heart failure) (HCC) 2025   Clotting disorder    DM (diabetes mellitus), type 2 (HCC) diet controlled 09/12/2018   Heart murmur 2020   HLD (hyperlipidemia) 09/12/2018   Hyperlipidemia    Hypertension    Myocardial infarction (HCC) 2020   PAF (paroxysmal atrial fibrillation) (HCC)    Syncope- may be due to diuretics 09/12/2018   Thyroid  disease    Past Surgical History:  Procedure Laterality Date   CARDIAC SURGERY     stents   CHOLECYSTECTOMY     EYE SURGERY  1978   LEFT HEART CATH AND CORONARY ANGIOGRAPHY N/A 09/11/2018   Procedure: LEFT HEART CATH AND CORONARY ANGIOGRAPHY;  Surgeon: Anner Alm ORN, MD;  Location: [facility] INVASIVE CV LAB;  Service: Cardiovascular;  Laterality: N/A;   Family History  Problem Relation Age of Onset   Diabetes Mother    CAD Father        s/p stent and CABG   Heart disease Father    Heart attack Maternal Grandfather    Social History   Occupational History   Not on file  Tobacco Use   Smoking status: Former    Current packs/day: 0.00    Average packs/day: 2.0 packs/day for 24.4 years (48.8 ttl pk-yrs)    Types: Cigarettes    Start date: 29    Quit date: 01/17/1999    Years since quitting: 25.5   Smokeless tobacco: Never   Tobacco comments:    Chantex is the answer to quit yall should be pushing that  Vaping Use   Vaping status: Never Used  Substance and Sexual Activity   Alcohol use: Never   Drug use: Yes    Types: Marijuana   Sexual activity: Yes    Partners:  Female    Birth control/protection: None   Tobacco Counseling Counseling given: Yes Tobacco comments: Chantex is the answer to quit yall should be pushing that  SDOH Screenings   Food Insecurity: Patient Declined (06/10/2024)  Housing: High Risk (06/10/2024)  Transportation Needs: No Transportation Needs (06/10/2024)  Recent Concern: Transportation Needs - Unmet Transportation Needs (04/09/2024)  Utilities: Not At Risk (09/15/2023)   Received from [facility]  Depression (PHQ2-9): Low Risk  (06/13/2024)  Financial Resource Strain: Low Risk  (06/10/2024)  Physical Activity: Insufficiently Active (06/10/2024)  Social Connections: Socially Isolated (06/10/2024)  Stress: No Stress Concern Present (06/10/2024)  Recent Concern: Stress - Stress Concern Present (04/09/2024)  Tobacco Use: Medium Risk (07/18/2024)   See flowsheets for full screening details  Depression Screen PHQ 2 & 9 Depression Scale- Over the past 2 weeks, how often have you been bothered by any of the following problems? Little interest or pleasure in doing things: 0 Feeling down, depressed, or hopeless (PHQ Adolescent also includes...irritable): 0 PHQ-2 Total Score: 0 Trouble falling or staying asleep, or sleeping too much: 0 Feeling tired or having little energy: 0 Poor appetite or overeating (PHQ Adolescent also includes...weight loss): 0 Feeling bad about  yourself - or that you are a failure or have let yourself or your family down: 0 Trouble concentrating on things, such as reading the newspaper or watching television (PHQ Adolescent also includes...like school work): 0 Moving or speaking so slowly that other people could have noticed. Or the opposite - being so fidgety or restless that you have been moving around a lot more than usual: 0 Thoughts that you would be better off dead, or of hurting yourself in some way: 0 PHQ-9 Total Score: 0 If you checked off any problems, how difficult have these problems made it  for you to do your work, take care of things at home, or get along with other people?: Not difficult at all  Depression Treatment Depression Interventions/Treatment : EYV7-0 Score <4 Follow-up Not Indicated     Goals Addressed               This Visit's Progress     Remain active and healthy (pt-stated)         Visit info / Clinical Intake: Medicare Wellness Visit Type:: Subsequent Annual Wellness Visit Persons participating in visit:: patient Medicare Wellness Visit Mode:: Telephone If telephone:: video declined Because this visit was a virtual/telehealth visit:: pt reported vitals If Telephone or Video please confirm:: I connected with the patient using audio enabled telemedicine application and verified that I am speaking with the correct person using two identifiers; I discussed the limitations of evaluation and management by telemedicine; The patient expressed understanding and agreed to proceed Patient Location:: home Provider Location:: office Information given by:: patient Interpreter Needed?: No Pre-visit prep was completed: yes  Functional Status Activities of Daily Living (to include ambulation/medication): (Patient-Rptd) Independent Ambulation: (Patient-Rptd) Independent Medication Administration: Independent Home Management: (Patient-Rptd) Independent Manage your own finances?: yes Primary transportation is: driving Concerns about vision?: no *vision screening is required for WT[facility]* Concerns about hearing?: no  Fall Screening Falls in the past year?: (Patient-Rptd) 0 Number of falls in past year: (Patient-Rptd) 0 Was there an injury with Fall?: (Patient-Rptd) 0 Fall Risk Category Calculator: (Patient-Rptd) 0 Patient Fall Risk Level: (Patient-Rptd) Low Fall Risk  Fall Risk Patient at Risk for Falls Due to: No Fall Risks Fall risk Follow up: Falls evaluation completed; Education provided; Falls prevention discussed  Home and Transportation Safety: All  rugs have non-skid backing?: N/A, no rugs  Advance Directives (For Healthcare) Does Patient Have a Medical Advance Directive?: No Would patient like information on creating a medical advance directive?: No - Patient declined        Objective:    Today's Vitals   07/18/24 1448  Weight: 203 lb (92.1 kg)  Height: 5' 10 (1.778 m)   Body mass index is 29.13 kg/m.  Current Medications (verified) Outpatient Encounter Medications as of 07/18/2024  Medication Sig   albuterol  (VENTOLIN  HFA) 108 (90 Base) MCG/ACT inhaler Inhale 1-2 puffs into the lungs every 6 (six) hours as needed.   amLODipine  (NORVASC ) 5 MG tablet Take 1.5 tablets (7.5 mg total) by mouth daily.   apixaban  (ELIQUIS ) 5 MG TABS tablet Take 1 tablet (5 mg total) by mouth 2 (two) times daily.   atorvastatin  (LIPITOR ) 80 MG tablet Take 1 tablet (80 mg total) by mouth daily.   benzonatate  (TESSALON ) 100 MG capsule Take 1-2 capsules (100-200 mg total) by mouth 3 (three) times daily as needed.   carvedilol  (COREG ) 12.5 MG tablet Take 1 tablet (12.5 mg total) by mouth 2 (two) times daily with a meal.   clopidogrel  (PLAVIX )  75 MG tablet Take 1 tablet (75 mg total) by mouth daily.   colestipol  (COLESTID ) 1 g tablet Take 1 g by mouth 2 (two) times daily.   dicyclomine  (BENTYL ) 20 MG tablet Take 20 mg by mouth 3 (three) times daily before meals. Takes two hours before or five hours after other medications   ezetimibe  (ZETIA ) 10 MG tablet Take 1 tablet (10 mg total) by mouth daily.   isosorbide  mononitrate (IMDUR ) 60 MG 24 hr tablet Take 1 tablet (60 mg total) by mouth daily.   levothyroxine  (SYNTHROID ) 125 MCG tablet TAKE 1 TABLET BY MOUTH ONCE DAILY BEFORE BREAKFAST   lipase/protease/amylase (CREON ) 36000 UNITS CPEP capsule Take 72,000 Units by mouth 3 (three) times daily with meals.   magnesium  (MAGTAB) 84 MG ( ) TBCR SR tablet Take 84 mg by mouth daily.   metFORMIN  (GLUCOPHAGE ) 500 MG tablet Take 1 tablet (500 mg total) by mouth  daily with breakfast.   nitroGLYCERIN  (NITROSTAT ) 0.4 MG SL tablet Place 1 tablet (0.4 mg total) under the tongue every 5 (five) minutes as needed for chest pain.   ondansetron  (ZOFRAN -ODT) 4 MG disintegrating tablet Take 1 tablet (4 mg total) by mouth every 8 (eight) hours as needed.   potassium chloride  (KLOR-CON ) 10 MEQ tablet TAKE 2 TABLETS BY MOUTH ON MONDAY, WEDNESDAY AND FRIDAY, THEN 1 TABLET ONCE DAILY ON TUESDAY, THURSDAY, SATURDAY AND SUNDAY   No facility-administered encounter medications on file as of 07/18/2024.   Hearing/Vision screen No results found. Immunizations and Health Maintenance Health Maintenance  Topic Date Due   OPHTHALMOLOGY EXAM  Never done   DTaP/Tdap/Td (1 - Tdap) Never done   Pneumococcal Vaccine: 50+ Years (1 of 2 - PCV) Never done   Zoster Vaccines- Shingrix (1 of 2) Never done   Medicare Annual Wellness (AWV)  03/22/2024   COVID-19 Vaccine (3 - 2025-26 season) 04/30/2024   Influenza Vaccine  11/27/2024 (Originally 03/30/2024)   HEMOGLOBIN A1C  08/09/2024   FOOT EXAM  10/05/2024   Diabetic kidney evaluation - Urine ACR  10/09/2024   Diabetic kidney evaluation - eGFR measurement  02/07/2025   Colonoscopy  05/05/2032   Hepatitis C Screening  Completed   HIV Screening  Completed   Hepatitis B Vaccines 19-59 Average Risk  Aged Out   HPV VACCINES  Aged Out   Meningococcal B Vaccine  Aged Out        Assessment/Plan:  This is a routine wellness examination for [facility].  Patient Care Team: Del Wilhelmena Falter, Hilario, FNP as PCP - General (Family Medicine) Lavona Agent, MD as Consulting Physician (Cardiology) Sherrilee Belvie CROME, MD as Consulting Physician (Urology) Watt Rush, MD as Attending Physician (Urology)  I have personally reviewed and noted the following in the patient's chart:   Medical and social history Use of alcohol, tobacco or illicit drugs  Current medications and supplements including opioid prescriptions. Functional ability  and status Nutritional status Physical activity Advanced directives List of other physicians Hospitalizations, surgeries, and ER visits in previous 12 months Vitals Screenings to include cognitive, depression, and falls Referrals and appointments  No orders of the defined types were placed in this encounter.  In addition, I have reviewed and discussed with patient certain preventive protocols, quality metrics, and best practice recommendations. A written personalized care plan for preventive services as well as general preventive health recommendations were provided to patient.   Tracey Stewart, CMA   07/18/2024   No follow-ups on file.  After Visit Summary: (MyChart) Due to this  being a telephonic visit, the after visit summary with patients personalized plan was offered to patient via MyChart   Nurse Notes: script for blood glucose meter sent to pharmacy

## 2024-07-18 NOTE — Patient Instructions (Addendum)
 William Mcmahon,  Thank you for taking the time for your Medicare Wellness Visit. I appreciate your continued commitment to your health goals. Please review the care plan we discussed, and feel free to reach out if I can assist you further.  Please note that Annual Wellness Visits do not include a physical exam. Some assessments may be limited, especially if the visit was conducted virtually. If needed, we may recommend an in-person follow-up with your provider.  Ongoing Care Seeing your primary care provider every 3 to 6 months helps us  monitor your health and provide consistent, personalized care.   Referrals If a referral was made during today's visit and you haven't received any updates within two weeks, please contact the referred provider directly to check on the status.  Lung Cancer Screening-[place] Office [address]-First Floor Medical Building directly across from [facility] Phone Number:[phone]   Recommended Screenings:  Health Maintenance  Topic Date Due   Eye exam for diabetics  Never done   DTaP/Tdap/Td vaccine (1 - Tdap) Never done   Pneumococcal Vaccine for age over 5 (1 of 2 - PCV) Never done   Zoster (Shingles) Vaccine (1 of 2) Never done   COVID-19 Vaccine (3 - 2025-26 season) 04/30/2024   Flu Shot  11/27/2024*   Hemoglobin A1C  08/09/2024   Complete foot exam   10/05/2024   Yearly kidney health urinalysis for diabetes  10/09/2024   Yearly kidney function blood test for diabetes  02/07/2025   Medicare Annual Wellness Visit  07/18/2025   Colon Cancer Screening  05/05/2032   Hepatitis C Screening  Completed   HIV Screening  Completed   Hepatitis B Vaccine  Aged Out   HPV Vaccine  Aged Out   Meningitis B Vaccine  Aged Out  *Topic was postponed. The date shown is not the original due date.       07/18/2024    1:46 PM  Advanced Directives  Does Patient Have a Medical Advance Directive? No  Would patient like information on creating a medical  advance directive? No - Patient declined    Vision: Annual vision screenings are recommended for early detection of glaucoma, cataracts, and diabetic retinopathy. These exams can also reveal signs of chronic conditions such as diabetes and high blood pressure.  Dental: Annual dental screenings help detect early signs of oral cancer, gum disease, and other conditions linked to overall health, including heart disease and diabetes.  Please see the attached documents for additional preventive care recommendations.

## 2024-07-22 ENCOUNTER — Emergency Department (HOSPITAL_COMMUNITY)
Admission: EM | Admit: 2024-07-22 | Discharge: 2024-07-22 | Disposition: A | Discharge status: Home / Self Care | Attending: Emergency Medicine | Admitting: Emergency Medicine

## 2024-07-22 ENCOUNTER — Emergency Department (HOSPITAL_COMMUNITY)

## 2024-07-22 ENCOUNTER — Other Ambulatory Visit: Payer: Self-pay

## 2024-07-22 DIAGNOSIS — I4891 Unspecified atrial fibrillation: Secondary | ICD-10-CM | POA: Diagnosis not present

## 2024-07-22 DIAGNOSIS — E039 Hypothyroidism, unspecified: Secondary | ICD-10-CM | POA: Diagnosis not present

## 2024-07-22 DIAGNOSIS — I509 Heart failure, unspecified: Secondary | ICD-10-CM | POA: Insufficient documentation

## 2024-07-22 DIAGNOSIS — Z85850 Personal history of malignant neoplasm of thyroid: Secondary | ICD-10-CM | POA: Diagnosis not present

## 2024-07-22 DIAGNOSIS — Z7901 Long term (current) use of anticoagulants: Secondary | ICD-10-CM | POA: Diagnosis not present

## 2024-07-22 DIAGNOSIS — Z7984 Long term (current) use of oral hypoglycemic drugs: Secondary | ICD-10-CM | POA: Diagnosis not present

## 2024-07-22 DIAGNOSIS — I2510 Atherosclerotic heart disease of native coronary artery without angina pectoris: Secondary | ICD-10-CM | POA: Insufficient documentation

## 2024-07-22 DIAGNOSIS — Z79899 Other long term (current) drug therapy: Secondary | ICD-10-CM | POA: Diagnosis not present

## 2024-07-22 DIAGNOSIS — E119 Type 2 diabetes mellitus without complications: Secondary | ICD-10-CM | POA: Diagnosis not present

## 2024-07-22 DIAGNOSIS — Z7902 Long term (current) use of antithrombotics/antiplatelets: Secondary | ICD-10-CM | POA: Diagnosis not present

## 2024-07-22 DIAGNOSIS — R079 Chest pain, unspecified: Secondary | ICD-10-CM | POA: Diagnosis present

## 2024-07-22 DIAGNOSIS — I110 Hypertensive heart disease with heart failure: Secondary | ICD-10-CM | POA: Insufficient documentation

## 2024-07-22 DIAGNOSIS — Z79890 Hormone replacement therapy (postmenopausal): Secondary | ICD-10-CM | POA: Insufficient documentation

## 2024-07-22 LAB — BASIC METABOLIC PANEL WITH GFR
Anion gap: 15 (ref 5–15)
BUN: 10 mg/dL (ref 8–23)
CO2: 23 mmol/L (ref 22–32)
Calcium: 9.4 mg/dL (ref 8.9–10.3)
Chloride: 104 mmol/L (ref 98–111)
Creatinine, Ser: 0.86 mg/dL (ref 0.61–1.24)
GFR, Estimated: >60 mL/min (ref >60)
Glucose, Bld: 160 mg/dL — ABNORMAL HIGH (ref 70–99)
Potassium: 3.8 mmol/L (ref 3.5–5.1)
Sodium: 142 mmol/L (ref 135–145)

## 2024-07-22 LAB — CBC
HCT: 47.7 % (ref 39.0–52.0)
Hemoglobin: 17.1 g/dL — ABNORMAL HIGH (ref 13.0–17.0)
MCH: 32.3 pg (ref 26.0–34.0)
MCHC: 35.8 g/dL (ref 30.0–36.0)
MCV: 90.2 fL (ref 80.0–100.0)
Platelets: 218 K/uL (ref 150–400)
RBC: 5.29 MIL/uL (ref 4.22–5.81)
RDW: 11.9 % (ref 11.5–15.5)
WBC: 8.6 K/uL (ref 4.0–10.5)
nRBC: 0 % (ref 0.0–0.2)

## 2024-07-22 LAB — TROPONIN T, HIGH SENSITIVITY
Troponin T High Sensitivity: <15 ng/L (ref 0–19)
Troponin T High Sensitivity: <15 ng/L (ref 0–19)

## 2024-07-22 LAB — MAGNESIUM: Magnesium: 1.8 mg/dL (ref 1.7–2.4)

## 2024-07-22 MED ORDER — METOPROLOL TARTRATE 5 MG/5ML IV SOLN
5.0000 mg | Freq: Once | INTRAVENOUS | Status: AC
  Administered 2024-07-22: 5 mg via INTRAVENOUS
  Filled 2024-07-22: qty 5

## 2024-07-22 MED ORDER — FENTANYL CITRATE (PF) 100 MCG/2ML IJ SOLN
50.0000 ug | Freq: Once | INTRAMUSCULAR | Status: AC
  Administered 2024-07-22: 50 ug via INTRAVENOUS
  Filled 2024-07-22: qty 2

## 2024-07-22 NOTE — ED Triage Notes (Signed)
 Pt tot he ED with complaints of chest pain with shortness of breath that began about an hour ago.  Pt states he feels like he is having a MI and has history of the same.

## 2024-07-22 NOTE — ED Provider Notes (Signed)
 This patient is a critically ill-appearing 63 year old male presenting with a complaint of chest discomfort and shortness of breath.  This started this morning.  He has a history of paroxysmal atrial fibrillation and takes Eliquis .  When asked when he last took his Eliquis  he states I do not miss it.  He also takes Plavix , he has multiple stents in his heart.  He was last seen by cardiology earlier this year.  EKG shows A-fib with RVR, right bundle branch block pattern  The patient required multiple doses of AV nodal blocking agents, was kept on a cardiac monitor with multiple reevaluations.  Care was discussed with cardiology, the patient was symptom-free when he converted to normal sinus rhythm after medications.  Thankfully did not require cardioversion, 2 negative troponins, the patient requested discharge instead of admission   Cleotilde Rogue, MD 07/23/24 [phone]

## 2024-07-22 NOTE — ED Provider Notes (Signed)
 [place] EMERGENCY DEPARTMENT AT [facility] Provider Note   CSN: [id number] Arrival date & time: 07/22/24  9047     Patient presents with: Chest Pain   William Mcmahon is a 63 y.o. male with history of CAD with PCI and DES 2013, 2019, paroxysmal A-fib on Eliquis  and Coreg , hypertension, hyperlipidemia, hypothyroidism presents with complaints of chest pain and shortness of breath that started earlier this morning.  Chest pain is radiating to his neck is constant.  Associated with dizziness and nausea.  Denies any preceding illness, cough or URI symptoms.     Chest Pain     Past Medical History:  Diagnosis Date   Allergy    Naproxen   Arthritis 2020   CAD in native artery, with prior stents, and instent restenosis of small vessel, medical therapy, LAD and RCA stents are patent 09/12/2018   Cancer (HCC)    thyroid    CHF (congestive heart failure) (HCC) 2025   Clotting disorder    DM (diabetes mellitus), type 2 (HCC) diet controlled 09/12/2018   Heart murmur 2020   HLD (hyperlipidemia) 09/12/2018   Hyperlipidemia    Hypertension    Myocardial infarction (HCC) 2020   PAF (paroxysmal atrial fibrillation) (HCC)    Syncope- may be due to diuretics 09/12/2018   Thyroid  disease    Past Surgical History:  Procedure Laterality Date   CARDIAC SURGERY     stents   CHOLECYSTECTOMY     EYE SURGERY  1978   LEFT HEART CATH AND CORONARY ANGIOGRAPHY N/A 09/11/2018   Procedure: LEFT HEART CATH AND CORONARY ANGIOGRAPHY;  Surgeon: Anner Alm ORN, MD;  Location: [facility] INVASIVE CV LAB;  Service: Cardiovascular;  Laterality: N/A;     Prior to Admission medications   Medication Sig Start Date End Date Taking? Authorizing Provider  albuterol  (VENTOLIN  HFA) 108 (90 Base) MCG/ACT inhaler Inhale 1-2 puffs into the lungs every 6 (six) hours as needed. 03/14/24   Vivienne Delon HERO, PA-C  amLODipine  (NORVASC ) 5 MG tablet Take 1.5 tablets (7.5 mg total) by mouth daily. 11/01/23    Lavona Agent, MD  apixaban  (ELIQUIS ) 5 MG TABS tablet Take 1 tablet (5 mg total) by mouth 2 (two) times daily. 11/01/23   Lavona Agent, MD  atorvastatin  (LIPITOR ) 80 MG tablet Take 1 tablet (80 mg total) by mouth daily. 11/01/23   Lavona Agent, MD  benzonatate  (TESSALON ) 100 MG capsule Take 1-2 capsules (100-200 mg total) by mouth 3 (three) times daily as needed. 03/14/24   Vivienne Delon HERO, PA-C  Blood Glucose Monitoring Suppl DEVI 1 each by Does not apply route in the morning, at noon, and at bedtime. May substitute to any manufacturer covered by patient's insurance. 07/18/24   Bevely Doffing, FNP  carvedilol  (COREG ) 12.5 MG tablet Take 1 tablet (12.5 mg total) by mouth 2 (two) times daily with a meal. 11/01/23   Lavona Agent, MD  clopidogrel  (PLAVIX ) 75 MG tablet Take 1 tablet (75 mg total) by mouth daily. 11/01/23   Lavona Agent, MD  colestipol  (COLESTID ) 1 g tablet Take 1 g by mouth 2 (two) times daily. 10/21/22   [provider]  dicyclomine  (BENTYL ) 20 MG tablet Take 20 mg by mouth 3 (three) times daily before meals. Takes two hours before or five hours after other medications    [provider]  ezetimibe  (ZETIA ) 10 MG tablet Take 1 tablet (10 mg total) by mouth daily. 11/01/23   Lavona Agent, MD  Glucose Blood (BLOOD GLUCOSE TEST  STRIPS) STRP 1 each by In Vitro route in the morning, at noon, and at bedtime. May substitute to any manufacturer covered by patient's insurance. 07/18/24 08/17/24  Bevely Doffing, FNP  isosorbide  mononitrate (IMDUR ) 60 MG 24 hr tablet Take 1 tablet (60 mg total) by mouth daily. 11/01/23   Lavona Agent, MD  Lancet Device MISC 1 each by Does not apply route in the morning, at noon, and at bedtime. May substitute to any manufacturer covered by patient's insurance. 07/18/24 08/17/24  Bevely Doffing, FNP  Lancets MISC 1 each by Does not apply route as directed. Dispense based on patient and insurance preference. Use up to four times daily as  directed. (FOR ICD-10 E10.9, E11.9). 07/18/24   Bevely Doffing, FNP  levothyroxine  (SYNTHROID ) 125 MCG tablet TAKE 1 TABLET BY MOUTH ONCE DAILY BEFORE BREAKFAST 04/09/24   Del Orbe Polanco, Hilario, FNP  lipase/protease/amylase (CREON ) 36000 UNITS CPEP capsule Take 72,000 Units by mouth 3 (three) times daily with meals.    [provider]  magnesium  (MAGTAB) 84 MG ( ) TBCR SR tablet Take 84 mg by mouth daily.    [provider]  metFORMIN  (GLUCOPHAGE ) 500 MG tablet Take 1 tablet (500 mg total) by mouth daily with breakfast. 06/13/24   Bevely Doffing, FNP  nitroGLYCERIN  (NITROSTAT ) 0.4 MG SL tablet Place 1 tablet (0.4 mg total) under the tongue every 5 (five) minutes as needed for chest pain. 10/28/22   Lavona Agent, MD  ondansetron  (ZOFRAN -ODT) 4 MG disintegrating tablet Take 1 tablet (4 mg total) by mouth every 8 (eight) hours as needed. 03/27/24   Vivienne Delon HERO, PA-C  potassium chloride  (KLOR-CON ) 10 MEQ tablet TAKE 2 TABLETS BY MOUTH ON MONDAY, WEDNESDAY AND FRIDAY, THEN 1 TABLET ONCE DAILY ON TUESDAY, THURSDAY, SATURDAY AND SUNDAY 11/01/23   Lavona Agent, MD    Allergies: Naproxen    Review of Systems  Cardiovascular:  Positive for chest pain.    Updated Vital Signs BP 122/72   Pulse (!) 52   Temp 98 F (36.7 C) (Axillary)   Resp 12   Ht 5' 10 (1.778 m)   Wt 92.1 kg   SpO2 97%   BMI 29.13 kg/m   Physical Exam Vitals and nursing note reviewed.  Constitutional:      Appearance: He is well-developed.     Comments: Patient is diaphoretic and ill-appearing upon presentation  HENT:     Head: Normocephalic and atraumatic.  Eyes:     Conjunctiva/sclera: Conjunctivae normal.  Cardiovascular:     Rate and Rhythm: Tachycardia present. Rhythm irregular.     Heart sounds: No murmur heard. Pulmonary:     Effort: Pulmonary effort is normal. No respiratory distress.     Breath sounds: Normal breath sounds.  Abdominal:     Palpations: Abdomen is soft.      Tenderness: There is no abdominal tenderness.  Musculoskeletal:        General: No swelling.     Cervical back: Neck supple.  Skin:    General: Skin is warm and dry.     Capillary Refill: Capillary refill takes less than 2 seconds.  Neurological:     Mental Status: He is alert.  Psychiatric:        Mood and Affect: Mood normal.     (all labs ordered are listed, but only abnormal results are displayed) Labs Reviewed  BASIC METABOLIC PANEL WITH GFR - Abnormal; Notable for the following components:      Result Value   Glucose,  Bld 160 (*)    All other components within normal limits  CBC - Abnormal; Notable for the following components:   Hemoglobin 17.1 (*)    All other components within normal limits  MAGNESIUM   TROPONIN T, HIGH SENSITIVITY  TROPONIN T, HIGH SENSITIVITY    EKG: EKG Interpretation Date/Time:  Sunday July 22 2024 12:29:55 EST Ventricular Rate:  70 PR Interval:  149 QRS Duration:  110 QT Interval:  391 QTC Calculation: 342 R Axis:   194  Text Interpretation: Sinus bradycardia Ventricular tachycardia, unsustained Right axis deviation Abnormal R-wave progression, late transition Nonspecific T abnormalities, diffuse leads Since last tracing afib has resolved Confirmed by Cleotilde Rogue ([id number]) on 07/22/2024 12:51:32 PM  Radiology: [facility] Chest Port 1 View Result Date: 07/22/2024 EXAM: 1 VIEW(S) XRAY OF THE CHEST 07/22/2024 10:12:25 AM COMPARISON: 04/09/2024 CLINICAL HISTORY: Chest pain FINDINGS: LUNGS AND PLEURA: No focal pulmonary opacity. No pleural effusion. No pneumothorax. HEART AND MEDIASTINUM: Coronary stenting noted. Aortic atherosclerosis. No acute abnormality of the cardiac and mediastinal silhouettes. BONES AND SOFT TISSUES: No acute osseous abnormality. IMPRESSION: 1. No acute cardiopulmonary findings. 2. Coronary stenting and aortic atherosclerosis noted. Electronically signed by: Lonni Necessary MD 07/22/2024 10:33 AM EST RP Workstation:  [id number]     .Critical Care  Performed by: Donnajean Lynwood DEL, PA-C Authorized by: Donnajean Lynwood DEL, PA-C   Critical care provider statement:    Critical care time (minutes):  30   Critical care was necessary to treat or prevent imminent or life-threatening deterioration of the following conditions:  Cardiac failure   Critical care was time spent personally by me on the following activities:  Development of treatment plan with patient or surrogate, discussions with consultants, evaluation of patient's response to treatment, examination of patient, ordering and review of laboratory studies, ordering and review of radiographic studies, ordering and performing treatments and interventions, pulse oximetry, re-evaluation of patient's condition and review of old charts    Medications Ordered in the ED  metoprolol  tartrate (LOPRESSOR ) injection 5 mg (5 mg Intravenous Given 07/22/24 1012)  fentaNYL  (SUBLIMAZE ) injection 50 mcg (50 mcg Intravenous Given 07/22/24 1012)  metoprolol  tartrate (LOPRESSOR ) injection 5 mg (5 mg Intravenous Given 07/22/24 1141)    Clinical Course as of 07/22/24 1407  Sun Jul 22, 2024  1004 Patient with significant cardiovascular history and risk factors evaluated for sudden chest pain, shortness of breath that began earlier this morning.  Upon arrival he is hypertensive, diaphoretic and ill appearing.  He is tachycardic and found to be in A-fib with RVR.  With rates in the 140s.  His lung sounds are clear.  No lower extremity edema.  Will consult cardiology for further management.  Will give a dose of Lopressor . [JT]  1012 EKG 12-Lead A-fib with RVR with right bundle [JT]  1038 DG Chest Port 1 View No acute cardiopulmonary disease [JT]  1056 Discussed patient with cardiology, Dr. Adina Primus, given concern for ischemic disease versus isolated RVR.  Recommended trending troponin.  If no elevation then plan to cardiovert. [JT]  1058 Troponin T, High  Sensitivity Without elevation, will trend [JT]  1150 Reevaluation patient appears more comfortable, heart rate now around 118-120, will administer another dose of Lopressor  [JT]  1247 Magnesium  Within normal limits [JT]  1250  EKG Interpretation Date/Time:  Sunday July 22 2024 09:55:30 EST Ventricular Rate:  136 PR Interval:    QRS Duration:  159 QT Interval:  326 QTC Calculation: 491 R Axis:   162  Text Interpretation:  Atrial fibrillation Paired ventricular premature  complexes RBBB and LPFB ST depr, consider ischemia, anterolateral lds  Since last tracing Normal sinus rhythm replaced by afib Confirmed by  Cleotilde Rogue [phone]) on 07/22/2024 9:58:13 AM      [BM]  1251  EKG Interpretation Date/Time:  Sunday July 22 2024 12:29:55 EST Ventricular Rate:  70 PR Interval:  149 QRS Duration:  110 QT Interval:  391 QTC Calculation: 342 R Axis:   194  Text Interpretation: Sinus bradycardia Ventricular tachycardia,  unsustained Right axis deviation Abnormal R-wave progression, late  transition Nonspecific T abnormalities, diffuse leads Since last tracing  afib has resolved Confirmed by Cleotilde Rogue [phone]) on 07/22/2024  12:51:32 PM       [BM]  1300 Troponin T, High Sensitivity Delta troponin without elevation [JT]  1316 Patient asymptomatic since converting back to sinus rhythm.  His workup is overall reassuring.  Offered admission given his significant cardiovascular history.  He declines.  Will follow-up closely with his PCP and cardiologist.  Encouraged to continue his home prescriptions.  Strict return precautions provided.  Patient is understanding and agreement with plan. [JT]  1359 Critical care billed given presentation of significant tachycardia with RVR and ill appearance with known ischemic disease, requiring coordination with specialists and multiple doses of therapeutics to regain hemodynamic stability. [JT]    Clinical Course User Index [BM] Cleotilde Rogue,  MD [JT] Donnajean Lynwood DEL, PA-C                                 Medical Decision Making Amount and/or Complexity of Data Reviewed Labs: ordered. Decision-making details documented in ED Course. Radiology: ordered. Decision-making details documented in ED Course. ECG/medicine tests:  Decision-making details documented in ED Course.  Risk Prescription drug management.   This patient presents to the ED with chief complaint(s) of chest pain .  The complaint involves an extensive differential diagnosis and also carries with it a high risk of complications and morbidity.   Pertinent past medical history as listed in HPI  The differential diagnosis includes  Do not suspect ACS as patient is without any EKG changes or troponin elevation.  With resolution of symptoms upon converting to sinus rhythm, do not suspect aortic dissection, pulmonary embolism, pneumothorax or pneumonia.  Additional history obtained: Additional history obtained from family Records reviewed Care Everywhere/External Records  Disposition:   Patient will be discharged home. The patient has been appropriately medically screened and/or stabilized in the ED. I have low suspicion for any other emergent medical condition which would require further screening, evaluation or treatment in the ED or require inpatient management. At time of discharge the patient is hemodynamically stable and in no acute distress. I have discussed work-up results and diagnosis with patient and answered all questions. Patient is agreeable with discharge plan. We discussed strict return precautions for returning to the emergency department and they verbalized understanding.     Social Determinants of Health:   none  This note was dictated with voice recognition software.  Despite best efforts at proofreading, errors may have occurred which can change the documentation meaning.       Final diagnoses:  Atrial fibrillation with RVR [facility]    ED  Discharge Orders     None          Donnajean Lynwood DEL DEVONNA 07/22/24 1407    Cleotilde Rogue, MD 07/23/24 [phone]

## 2024-07-22 NOTE — Discharge Instructions (Signed)
 You were evaluated in the emergency room for chest pain and shortness of breath.  You are found to be in A-fib with RVR.  You converted back to normal sinus rhythm.  Please continue your medications as prescribed and follow-up with your cardiologist and primary care doctor within the next few days.  If you experience any new or worsening symptoms please return to the emergency room.

## 2024-07-24 ENCOUNTER — Other Ambulatory Visit: Payer: Self-pay

## 2024-07-24 MED ORDER — COLESTIPOL HCL 1 G PO TABS: 1.0000 g | ORAL_TABLET | Freq: Two times a day (BID) | ORAL | 1 refills | Status: AC

## 2024-07-25 ENCOUNTER — Telehealth (HOSPITAL_COMMUNITY): Payer: Self-pay

## 2024-07-25 NOTE — Telephone Encounter (Signed)
Left message for patient to call back to schedule ED follow up appointment. ?

## 2024-07-29 ENCOUNTER — Other Ambulatory Visit: Payer: Self-pay | Admitting: Cardiology

## 2024-09-04 ENCOUNTER — Other Ambulatory Visit: Payer: Self-pay | Admitting: Cardiology

## 2024-09-05 ENCOUNTER — Other Ambulatory Visit: Payer: Self-pay

## 2024-09-05 MED ORDER — POTASSIUM CHLORIDE ER 10 MEQ PO TBCR: EXTENDED_RELEASE_TABLET | ORAL | 3 refills | Status: AC

## 2024-09-05 NOTE — Telephone Encounter (Signed)
 Potassium was sent to [facility] in [place]

## 2024-09-13 ENCOUNTER — Telehealth

## 2024-09-13 DIAGNOSIS — J0110 Acute frontal sinusitis, unspecified: Secondary | ICD-10-CM | POA: Diagnosis not present

## 2024-09-13 MED ORDER — AMOXICILLIN-POT CLAVULANATE 875-125 MG PO TABS: 1.0000 | ORAL_TABLET | Freq: Two times a day (BID) | ORAL | 0 refills | Status: AC

## 2024-09-13 MED ORDER — FLUTICASONE PROPIONATE 50 MCG/ACT NA SUSP: 2.0000 | Freq: Every day | NASAL | 2 refills | Status: AC

## 2024-09-13 NOTE — Assessment & Plan Note (Signed)
 Symptoms of sinus fullness and breathing difficulty. Discouraged Afrin use due to rebound congestion risk. Recommended saline nasal spray.  - Prescribed Augmentin . - Prescribed Flonase . - Advised saline nasal spray. - Advised increased fluid intake.

## 2024-09-13 NOTE — Progress Notes (Signed)
 "  Virtual Visit via Video Note  I connected with [name] on 09/13/24 at 10:00 AM EST by a video enabled telemedicine application and verified that I am speaking with the correct person using two identifiers.  Patient Location: Home Provider Location: Home Office  I discussed the limitations, risks, security, and privacy concerns of performing an evaluation and management service by video and the availability of in person appointments. I also discussed with the patient that there may be a patient responsible charge related to this service. The patient expressed understanding and agreed to proceed.  Subjective: PCP: William Wilhelmena Lloyd Hilario, FNP  Chief Complaint  Patient presents with   URI    Shortness of breath, fatigue, diarrhea, congestion    HPI  Discussed the use of AI scribe software for clinical note transcription with the patient, who gave verbal consent to proceed.  History of Present Illness    William Mcmahon is a 64 year old male who presents with sinus congestion and gastrointestinal symptoms.  Sinus congestion and respiratory symptoms - Sinus congestion and difficulty breathing since Sunday - Sinuses feel 'full' and unable to breathe through nose - No current use of Flonase ; previously effective during similar episode last year - Currently using Afrin and rosatussin with elderberry for symptom management - Theraflu provides symptomatic relief  Prior episode and treatment response - Similar episode last year successfully treated with Augmentin  and Flonase      ROS: Per HPI Current Medications[1]  Observations/Objective: There were no vitals filed for this visit. Physical Exam Constitutional:      Appearance: He is ill-appearing.  Neurological:     Mental Status: He is alert and oriented to person, place, and time.  Psychiatric:        Mood and Affect: Mood normal.        Thought Content: Thought content normal.     Assessment and Plan: Acute  non-recurrent frontal sinusitis Assessment & Plan: Symptoms of sinus fullness and breathing difficulty. Discouraged Afrin use due to rebound congestion risk. Recommended saline nasal spray.  - Prescribed Augmentin . - Prescribed Flonase . - Advised saline nasal spray. - Advised increased fluid intake.   Orders: -     Amoxicillin -Pot Clavulanate; Take 1 tablet by mouth 2 (two) times daily for 7 days.  Dispense: 14 tablet; Refill: 0 -     Fluticasone  Propionate; Place 2 sprays into both nostrils daily.  Dispense: 16 g; Refill: 2  Assessment and Plan  Follow Up Instructions: No follow-ups on file.    I discussed the assessment and treatment plan with the patient. The patient was provided an opportunity to ask questions, and all were answered. The patient agreed with the plan and demonstrated an understanding of the instructions.   The patient was advised to call back or seek an in-person evaluation if the symptoms worsen or if the condition fails to improve as anticipated.  The above assessment and management plan was discussed with the patient. The patient verbalized understanding of and has agreed to the management plan.   [name], FNP     [1]  Current Outpatient Medications:    amoxicillin -clavulanate (AUGMENTIN ) 875-125 MG tablet, Take 1 tablet by mouth 2 (two) times daily for 7 days., Disp: 14 tablet, Rfl: 0   fluticasone  (FLONASE ) 50 MCG/ACT nasal spray, Place 2 sprays into both nostrils daily., Disp: 16 g, Rfl: 2   albuterol  (VENTOLIN  HFA) 108 (90 Base) MCG/ACT inhaler, Inhale 1-2 puffs into the lungs every 6 (six) hours as  needed., Disp: 8 g, Rfl: 0   amLODipine  (NORVASC ) 5 MG tablet, Take 1.5 tablets (7.5 mg total) by mouth daily., Disp: 135 tablet, Rfl: 0   apixaban  (ELIQUIS ) 5 MG TABS tablet, Take 1 tablet (5 mg total) by mouth 2 (two) times daily., Disp: 180 tablet, Rfl: 3   atorvastatin  (LIPITOR ) 80 MG tablet, Take 1 tablet (80 mg total) by mouth daily., Disp: 90  tablet, Rfl: 3   benzonatate  (TESSALON ) 100 MG capsule, Take 1-2 capsules (100-200 mg total) by mouth 3 (three) times daily as needed., Disp: 30 capsule, Rfl: 0   Blood Glucose Monitoring Suppl DEVI, 1 each by Does not apply route in the morning, at noon, and at bedtime. May substitute to any manufacturer covered by patient's insurance., Disp: 1 each, Rfl: 0   carvedilol  (COREG ) 12.5 MG tablet, Take 1 tablet (12.5 mg total) by mouth 2 (two) times daily with a meal., Disp: 180 tablet, Rfl: 3   clopidogrel  (PLAVIX ) 75 MG tablet, Take 1 tablet (75 mg total) by mouth daily., Disp: 90 tablet, Rfl: 3   colestipol  (COLESTID ) 1 g tablet, Take 1 tablet (1 g total) by mouth 2 (two) times daily., Disp: 180 tablet, Rfl: 1   dicyclomine  (BENTYL ) 20 MG tablet, Take 20 mg by mouth 3 (three) times daily before meals. Takes two hours before or five hours after other medications, Disp: , Rfl:    ezetimibe  (ZETIA ) 10 MG tablet, Take 1 tablet (10 mg total) by mouth daily., Disp: 90 tablet, Rfl: 3   isosorbide  mononitrate (IMDUR ) 60 MG 24 hr tablet, Take 1 tablet (60 mg total) by mouth daily., Disp: 90 tablet, Rfl: 3   Lancets MISC, 1 each by Does not apply route as directed. Dispense based on patient and insurance preference. Use up to four times daily as directed. (FOR ICD-10 E10.9, E11.9)., Disp: 100 each, Rfl: 0   levothyroxine  (SYNTHROID ) 125 MCG tablet, TAKE 1 TABLET BY MOUTH ONCE DAILY BEFORE BREAKFAST, Disp: 90 tablet, Rfl: 1   lipase/protease/amylase (CREON ) 36000 UNITS CPEP capsule, Take 72,000 Units by mouth 3 (three) times daily with meals., Disp: , Rfl:    magnesium  (MAGTAB) 84 MG ( ) TBCR SR tablet, Take 84 mg by mouth daily., Disp: , Rfl:    metFORMIN  (GLUCOPHAGE ) 500 MG tablet, Take 1 tablet (500 mg total) by mouth daily with breakfast., Disp: 90 tablet, Rfl: 1   nitroGLYCERIN  (NITROSTAT ) 0.4 MG SL tablet, Place 1 tablet (0.4 mg total) under the tongue every 5 (five) minutes as needed for chest pain.,  Disp: 25 tablet, Rfl: 2   ondansetron  (ZOFRAN -ODT) 4 MG disintegrating tablet, Take 1 tablet (4 mg total) by mouth every 8 (eight) hours as needed., Disp: 20 tablet, Rfl: 0   potassium chloride  (KLOR-CON ) 10 MEQ tablet, TAKE 2 TABLETS BY MOUTH ON MONDAY, WEDNESDAY AND FRIDAY, THEN 1 TABLET ONCE DAILY ON TUESDAY, THURSDAY, SATURDAY AND SUNDAY, Disp: 135 tablet, Rfl: 3  "

## 2024-10-04 ENCOUNTER — Other Ambulatory Visit: Payer: Self-pay | Admitting: Family Medicine

## 2024-10-05 ENCOUNTER — Other Ambulatory Visit: Payer: Self-pay

## 2024-10-05 DIAGNOSIS — E119 Type 2 diabetes mellitus without complications: Secondary | ICD-10-CM

## 2024-10-05 DIAGNOSIS — I2510 Atherosclerotic heart disease of native coronary artery without angina pectoris: Secondary | ICD-10-CM

## 2024-10-05 DIAGNOSIS — E7800 Pure hypercholesterolemia, unspecified: Secondary | ICD-10-CM

## 2024-10-05 DIAGNOSIS — I10 Essential (primary) hypertension: Secondary | ICD-10-CM

## 2024-10-05 DIAGNOSIS — E890 Postprocedural hypothyroidism: Secondary | ICD-10-CM

## 2024-10-18 ENCOUNTER — Ambulatory Visit

## 2024-10-26 ENCOUNTER — Ambulatory Visit: Admitting: Cardiology

## 2025-02-15 ENCOUNTER — Ambulatory Visit: Admitting: Urology

## 2025-07-23 ENCOUNTER — Ambulatory Visit
